# Patient Record
Sex: Female | Born: 1985 | Race: White | Hispanic: No | Marital: Married | Smoking: Never smoker
Health system: Southern US, Community
[De-identification: ages and names within clinical notes are randomized; demographics above are authoritative.]

## PROBLEM LIST (undated history)

## (undated) DIAGNOSIS — J45909 Unspecified asthma, uncomplicated: Secondary | ICD-10-CM

## (undated) DIAGNOSIS — Z862 Personal history of diseases of the blood and blood-forming organs and certain disorders involving the immune mechanism: Secondary | ICD-10-CM

## (undated) DIAGNOSIS — G43909 Migraine, unspecified, not intractable, without status migrainosus: Secondary | ICD-10-CM

## (undated) DIAGNOSIS — I498 Other specified cardiac arrhythmias: Secondary | ICD-10-CM

## (undated) DIAGNOSIS — R7301 Impaired fasting glucose: Secondary | ICD-10-CM

## (undated) DIAGNOSIS — I951 Orthostatic hypotension: Secondary | ICD-10-CM

## (undated) DIAGNOSIS — K219 Gastro-esophageal reflux disease without esophagitis: Secondary | ICD-10-CM

## (undated) DIAGNOSIS — I774 Celiac artery compression syndrome: Secondary | ICD-10-CM

## (undated) DIAGNOSIS — N189 Chronic kidney disease, unspecified: Secondary | ICD-10-CM

## (undated) DIAGNOSIS — I771 Stricture of artery: Secondary | ICD-10-CM

## (undated) DIAGNOSIS — G90A Postural orthostatic tachycardia syndrome (POTS): Secondary | ICD-10-CM

## (undated) DIAGNOSIS — I10 Essential (primary) hypertension: Secondary | ICD-10-CM

## (undated) DIAGNOSIS — R000 Tachycardia, unspecified: Secondary | ICD-10-CM

## (undated) DIAGNOSIS — J309 Allergic rhinitis, unspecified: Secondary | ICD-10-CM

## (undated) DIAGNOSIS — E209 Hypoparathyroidism, unspecified: Secondary | ICD-10-CM

## (undated) DIAGNOSIS — E200 Idiopathic hypoparathyroidism: Secondary | ICD-10-CM

## (undated) DIAGNOSIS — J4520 Mild intermittent asthma, uncomplicated: Secondary | ICD-10-CM

## (undated) DIAGNOSIS — D649 Anemia, unspecified: Secondary | ICD-10-CM

## (undated) HISTORY — DX: Impaired fasting glucose: R73.01

## (undated) HISTORY — DX: Gastro-esophageal reflux disease without esophagitis: K21.9

## (undated) HISTORY — DX: Personal history of diseases of the blood and blood-forming organs and certain disorders involving the immune mechanism: Z86.2

## (undated) HISTORY — DX: Essential (primary) hypertension: I10

## (undated) HISTORY — DX: Stricture of artery: I77.1

## (undated) HISTORY — DX: Unspecified asthma, uncomplicated: J45.909

## (undated) HISTORY — DX: Idiopathic hypoparathyroidism: E20.0

## (undated) HISTORY — DX: Celiac artery compression syndrome: I77.4

## (undated) HISTORY — DX: Migraine, unspecified, not intractable, without status migrainosus: G43.909

## (undated) HISTORY — DX: Mild intermittent asthma, uncomplicated: J45.20

## (undated) HISTORY — DX: Anemia, unspecified: D64.9

## (undated) HISTORY — DX: Chronic kidney disease, unspecified: N18.9

## (undated) HISTORY — DX: Allergic rhinitis, unspecified: J30.9

---
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
## 2005-03-03 ENCOUNTER — Ambulatory Visit: Payer: Self-pay | Admitting: Family Medicine

## 2005-04-06 ENCOUNTER — Ambulatory Visit: Payer: Self-pay | Admitting: Family Medicine

## 2005-07-21 ENCOUNTER — Ambulatory Visit: Payer: Self-pay | Admitting: Internal Medicine

## 2005-08-11 ENCOUNTER — Encounter: Admission: RE | Admit: 2005-08-11 | Discharge: 2005-08-11 | Payer: Self-pay

## 2005-09-01 ENCOUNTER — Ambulatory Visit: Payer: Self-pay | Admitting: Internal Medicine

## 2005-11-09 ENCOUNTER — Ambulatory Visit: Payer: Self-pay | Admitting: Family Medicine

## 2005-12-28 ENCOUNTER — Ambulatory Visit: Payer: Self-pay | Admitting: Family Medicine

## 2006-01-12 ENCOUNTER — Ambulatory Visit: Payer: Self-pay | Admitting: Internal Medicine

## 2006-02-23 ENCOUNTER — Ambulatory Visit: Payer: Self-pay | Admitting: Internal Medicine

## 2006-03-21 ENCOUNTER — Ambulatory Visit: Payer: Self-pay | Admitting: Internal Medicine

## 2006-07-16 ENCOUNTER — Ambulatory Visit: Payer: Self-pay | Admitting: Internal Medicine

## 2007-01-15 ENCOUNTER — Ambulatory Visit: Payer: Self-pay | Admitting: Internal Medicine

## 2007-01-15 ENCOUNTER — Encounter: Payer: Self-pay | Admitting: Internal Medicine

## 2007-01-15 DIAGNOSIS — N3000 Acute cystitis without hematuria: Secondary | ICD-10-CM | POA: Insufficient documentation

## 2007-01-15 DIAGNOSIS — R000 Tachycardia, unspecified: Secondary | ICD-10-CM | POA: Insufficient documentation

## 2007-01-15 DIAGNOSIS — E209 Hypoparathyroidism, unspecified: Secondary | ICD-10-CM | POA: Insufficient documentation

## 2007-01-15 DIAGNOSIS — Z889 Allergy status to unspecified drugs, medicaments and biological substances status: Secondary | ICD-10-CM | POA: Insufficient documentation

## 2007-01-15 DIAGNOSIS — G43909 Migraine, unspecified, not intractable, without status migrainosus: Secondary | ICD-10-CM | POA: Insufficient documentation

## 2007-01-15 LAB — CONVERTED CEMR LAB
Bilirubin Urine: NEGATIVE
Blood in Urine, dipstick: NEGATIVE
Glucose, Urine, Semiquant: NEGATIVE
Ketones, urine, test strip: NEGATIVE
Nitrite: NEGATIVE
Protein, U semiquant: NEGATIVE
Specific Gravity, Urine: 1.01
Urobilinogen, UA: 0.2
WBC Urine, dipstick: NEGATIVE
pH: 6.5

## 2007-02-28 ENCOUNTER — Ambulatory Visit: Payer: Self-pay | Admitting: Internal Medicine

## 2007-05-20 ENCOUNTER — Encounter: Payer: Self-pay | Admitting: Family Medicine

## 2007-05-30 ENCOUNTER — Encounter: Payer: Self-pay | Admitting: Family Medicine

## 2008-04-07 ENCOUNTER — Ambulatory Visit: Payer: Self-pay | Admitting: Family Medicine

## 2008-05-21 ENCOUNTER — Ambulatory Visit: Payer: Self-pay | Admitting: Internal Medicine

## 2008-05-25 ENCOUNTER — Encounter: Payer: Self-pay | Admitting: Family Medicine

## 2008-07-13 ENCOUNTER — Ambulatory Visit: Payer: Self-pay | Admitting: Internal Medicine

## 2008-07-14 ENCOUNTER — Telehealth (INDEPENDENT_AMBULATORY_CARE_PROVIDER_SITE_OTHER): Payer: Self-pay | Admitting: *Deleted

## 2008-07-14 ENCOUNTER — Ambulatory Visit: Payer: Self-pay | Admitting: Internal Medicine

## 2008-07-14 ENCOUNTER — Inpatient Hospital Stay (HOSPITAL_COMMUNITY): Admission: AD | Admit: 2008-07-14 | Discharge: 2008-07-16 | Payer: Self-pay | Admitting: Internal Medicine

## 2008-07-14 DIAGNOSIS — Z8679 Personal history of other diseases of the circulatory system: Secondary | ICD-10-CM

## 2008-07-14 DIAGNOSIS — J1189 Influenza due to unidentified influenza virus with other manifestations: Secondary | ICD-10-CM | POA: Insufficient documentation

## 2008-07-14 LAB — CONVERTED CEMR LAB
Inflenza A Ag: NEGATIVE
Influenza B Ag: NEGATIVE

## 2008-07-15 DIAGNOSIS — R519 Headache, unspecified: Secondary | ICD-10-CM | POA: Insufficient documentation

## 2008-07-15 DIAGNOSIS — R51 Headache: Secondary | ICD-10-CM | POA: Insufficient documentation

## 2008-07-16 ENCOUNTER — Encounter: Payer: Self-pay | Admitting: Family Medicine

## 2008-07-17 ENCOUNTER — Telehealth (INDEPENDENT_AMBULATORY_CARE_PROVIDER_SITE_OTHER): Payer: Self-pay | Admitting: *Deleted

## 2008-07-21 ENCOUNTER — Ambulatory Visit: Payer: Self-pay | Admitting: Family Medicine

## 2008-07-22 ENCOUNTER — Ambulatory Visit: Payer: Self-pay | Admitting: Family Medicine

## 2008-07-27 ENCOUNTER — Encounter (INDEPENDENT_AMBULATORY_CARE_PROVIDER_SITE_OTHER): Payer: Self-pay | Admitting: *Deleted

## 2008-07-27 LAB — CONVERTED CEMR LAB: Calcium Ionized: 1.01 mmol/L — ABNORMAL LOW (ref 1.12–1.32)

## 2008-08-14 ENCOUNTER — Ambulatory Visit: Payer: Self-pay | Admitting: Internal Medicine

## 2008-08-25 ENCOUNTER — Encounter: Payer: Self-pay | Admitting: Family Medicine

## 2008-09-03 ENCOUNTER — Encounter: Payer: Self-pay | Admitting: Family Medicine

## 2008-10-07 ENCOUNTER — Ambulatory Visit: Payer: Self-pay | Admitting: Family Medicine

## 2009-01-12 ENCOUNTER — Encounter: Payer: Self-pay | Admitting: Family Medicine

## 2009-03-19 ENCOUNTER — Ambulatory Visit: Payer: Self-pay | Admitting: Internal Medicine

## 2009-05-20 ENCOUNTER — Encounter: Payer: Self-pay | Admitting: Internal Medicine

## 2009-06-23 ENCOUNTER — Encounter: Payer: Self-pay | Admitting: Internal Medicine

## 2009-08-03 ENCOUNTER — Encounter: Payer: Self-pay | Admitting: Family Medicine

## 2009-12-06 ENCOUNTER — Ambulatory Visit: Payer: Self-pay | Admitting: Internal Medicine

## 2009-12-06 DIAGNOSIS — F32A Depression, unspecified: Secondary | ICD-10-CM | POA: Insufficient documentation

## 2009-12-06 DIAGNOSIS — F329 Major depressive disorder, single episode, unspecified: Secondary | ICD-10-CM

## 2010-02-14 ENCOUNTER — Ambulatory Visit: Payer: Self-pay | Admitting: Internal Medicine

## 2010-02-14 DIAGNOSIS — I10 Essential (primary) hypertension: Secondary | ICD-10-CM | POA: Insufficient documentation

## 2010-02-14 DIAGNOSIS — Q078 Other specified congenital malformations of nervous system: Secondary | ICD-10-CM | POA: Insufficient documentation

## 2010-02-21 ENCOUNTER — Encounter: Payer: Self-pay | Admitting: Internal Medicine

## 2010-11-15 NOTE — Consult Note (Signed)
Summary: [facility]  [facility]   Imported By: Sherian Rein 07/08/2010 14:17:34  _____________________________________________________________________  External Attachment:    Type:   Image     Comment:   External Document

## 2010-11-15 NOTE — Assessment & Plan Note (Signed)
Summary: ROV/ GD  Medications Added ZOFRAN 4 MG TABS (ONDANSETRON HCL) take one tablet every 4 hrs as needed LEXAPRO 10 MG TABS (ESCITALOPRAM OXALATE) take one tablet once daily MAG-OX 400 400 MG TABS (MAGNESIUM OXIDE) 1 tablet every day      Allergies Added:   Visit Type:  Follow-up Primary Provider:  Loreen Freud DO  CC:  pt states she has had elevated blood pressures for the last 4 or 5 months.  She states she has had as high as 165/110.  History of Present Illness: Patient is a 25 year old with a history of autonomic diysfunction.  I last saw her in clinic in June of last year. Since seen, she has dropped out of [facility].  She developed depression.  She was seen by Psych at [facility].  Since leaving school she is followed by intenal medicine.  She is also seeing a Haematologist at [employer] counselling. She says that her blood pressure has been very elevated.  She is gone up on her Effexor since she had problems. with headaches.  She has also been started on Lexapro. She notes some dizziness but no synco[pe.   Current Medications (verified): 1)  Clonidine Hcl 0.1 Mg Tabs (Clonidine Hcl) .... Take One Tablet By Mouth Twice A Day 2)  Effexor Xr 37.5 Mg Xr24h-Cap (Venlafaxine Hcl) .... 3 Tabs Daily 3)  Calcitriol 0.25 Mcg Caps (Calcitriol) .... Take Two Capsules in The Morning and One in The Evening. 4)  Caltrate 600 1500 Mg Tabs (Calcium Carbonate) .... Once Daily 5)  Mononesta Birth Control .... As Directed 6)  Zofran 4 Mg Tabs (Ondansetron Hcl) .... Take One Tablet Every 4 Hrs As Needed 7)  Lexapro 10 Mg Tabs (Escitalopram Oxalate) .... Take One Tablet Once Daily  Allergies (verified): 1)  ! * Yaz 2)  ! Cipro 3)  ! Septra 4)  ! Macrobid 5)  ! Vancomycin 6)  ! Beta Blockers 7)  ! * Mestinon 8)  ! * Proamitinbe 9)  ! Zoloft 10)  ! * Aderral 11)  ! * Concerta  Past History:  Past Medical History: Last updated: 07/14/2008 Dysautonomia,( POTS) diagnosed age 89 @  [facility] via [facility]; Recurrent UTI, Dr Aldean Ast; Hypoparathyroidism ; Hospitization 9/05 Acute Mono with elevated LFTs, decreased platelets, systemic fatigue ; Hosp 2/06 Influenza A with nausea  & vomiting;  Headache, migraine PMH of  Family History: Last updated: 07/14/2008 Father: HTN, bicuspid AV Mother: Gastroparesis, IBS Siblings: bro asthma, migraines  Social History:  Never Smoked Alcohol use-no After above history obtained in presence of her mother; I received additional history from Dr Tenny Craw . Apparently she does drink & smoke socially @ [employer]  Vital Signs:  Patient profile:   25 year old female Height:      67 inches Weight:      139 pounds BMI:     21.85 Pulse rate:   90 / minute Pulse (ortho):   100 / minute Pulse rhythm:   regular BP standing:   150 / 103  Vitals Entered By: Vikki Ports (December 06, 2009 4:41 PM)  Serial Vital Signs/Assessments:  Time      Position  BP       Pulse  Resp  Temp     By 4:46 PM   Lying RA  147/97   87                    Luz Jaramillo 4:46 PM   Sitting  145/103  95                    Vikki Ports 4:46 PM   Standing  150/103  100                   Vikki Ports 4:48 PM   Standing  145/105  98                    Luz Jaramillo 4:51 PM   Standing  148/106  99                    Vikki Ports   Physical Exam  Additional Exam:  Patient is in NAD HEENT:  Normocephalic, atraumatic. EOMI, PERRLA.  Neck: JVP is normal. No thyromegaly. No bruits.  Lungs: clear to auscultation. No rales no wheezes.  Heart: Regular rate and rhythm. Normal S1, S2. No S3.   No significant murmurs. PMI not displaced.  Abdomen:  Supple, nontender. Normal bowel sounds. No masses. No hepatomegaly.  Extremities:   Good distal pulses throughout. No lower extremity edema.  Musculoskeletal :moving all extremities.  Neuro:   alert and oriented x3.    EKG  Procedure date:  12/06/2009  Findings:      NSR>  90 bpm  Impression &  Recommendations:  Problem # 1:  POSTURAL HYPOTENSION, HX OF (ICD-V12.50) Patient is now hypertensive.  I have asked her to cut back on her Effexor to 2 tabs per day.  I will discuss with pharmacy further cut backs. She did not do well by her report on b blockers.  Felt foggy.  Problem # 2:  HEADACHE (ICD-784.0) Rx HTN Rx MgOxide  Problem # 3:  DEPRESSIVE DISORDER NOT ELSEWHERE CLASSIFIED (ICD-311) Will contact behavioral health for recommendations. Prescriptions: MAG-OX 400 400 MG TABS (MAGNESIUM OXIDE) 1 tablet every day  #30 x 6   Entered by:   Layne Benton, RN, BSN   Authorized by:   Sherrill Raring, MD, [facility]   Signed by:   Layne Benton, RN, BSN on 12/06/2009   Method used:   Electronically to        [facility] [address] [facility] [phone]* (retail)       [address]       [place], [place]  [address]       Ph: [phone]       Fax: [phone]   RxID:   [id number]   Appended Document: ROV/ GD Left msg to call.  Needs to decrease Effexor to 1x per day.  Needs f/u appt  for bp, orthostatics Needs to sign so that counsellor can speak to me Ollen Gross) Needs a psychiatrist.  3 options:  [facility]:  [phone]; [facility](?spelling)  [phone]; [facility]:  [phone]  Appended Document: ROV/ GD Patient spoke with Dr.Laquandra Carrillo...she is aware about medication...she has follow up appointment on 3/31.

## 2010-11-15 NOTE — Assessment & Plan Note (Signed)
Summary: 6wk f/u sl    Primary Provider:  Dwana Melena   History of Present Illness: patient is a 25 year old with a history of dysautonomia.    Allergies: 1)  ! * Yaz 2)  ! Cipro 3)  ! Septra 4)  ! Macrobid 5)  ! Vancomycin 6)  ! Beta Blockers 7)  ! * Mestinon 8)  ! * Proamitinbe 9)  ! Zoloft 10)  ! * Aderral 11)  ! * Concerta

## 2010-11-15 NOTE — Assessment & Plan Note (Signed)
Summary: 6wk f/u sl  Medications Added EFFEXOR XR 37.5 MG XR24H-CAP (VENLAFAXINE HCL) 2 tabs daily MAG-OX 400 400 MG TABS (MAGNESIUM OXIDE) 1 tablet every day HOLD XANAX 0.5 MG TABS (ALPRAZOLAM) as needed BENICAR 20 MG TABS (OLMESARTAN MEDOXOMIL) once daily        Visit Type:  6 wk Primary Provider:  Dr. Marnee Spring. Hall  CC:  dizziness.  History of Present Illness: Patient is a 25 year old with a history of autonomic dysfunction, depression.  I last saw her in February.  At the time she was hypertensive.  I had asked her to cut back on the Effexor since she was on Lexapro.  SHe has since been seen by [facility].  He placed her on Benicar. Since I saw her she has done fairly well.  She is waitressing about 5 hrs per day.  Has some dizziness but not severe.  Other days doing fine.  No chest pains. Does complain of headaches, no real change. She is followed by the counsellor and enjoys that [facility])  She said that Dr. Margo Aye wants to back down on Effexor and wanted cardiology  input.  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 Take One Tablet By Mouth Twice A Day 2)  Effexor Xr 37.5 Mg Xr24h-Cap (Venlafaxine Hcl) .... 2 Tabs Daily 3)  Calcitriol 0.25 Mcg Caps (Calcitriol) .... Take Two Capsules in The Morning and One in The Evening. 4)  Caltrate 600 1500 Mg Tabs (Calcium Carbonate) .... Once Daily 5)  Mononesta Birth Control .... As Directed 6)  Zofran 4 Mg Tabs (Ondansetron Hcl) .... Take One Tablet Every 4 Hrs As Needed 7)  Lexapro 10 Mg Tabs (Escitalopram  Oxalate) .... Take One Tablet Once Daily 8)  Mag-Ox 400 400 Mg Tabs (Magnesium Oxide) .Marland Kitchen.. 1 Tablet Every Day Hold 9)  Xanax 0.5 Mg Tabs (Alprazolam) .... As Needed 10)  Benicar 20 Mg Tabs (Olmesartan Medoxomil) .... Once Daily  Allergies: 1)  ! * Yaz 2)  ! Cipro 3)  ! Septra 4)  ! Macrobid 5)  ! Vancomycin 6)  ! Beta Blockers 7)  ! * Mestinon 8)  ! * Proamitinbe 9)  ! Zoloft 10)  ! * Aderral 11)  ! * Concerta  Past History:  Past medical, surgical, family and social histories (including risk factors) reviewed, and no changes noted (except as noted below).  Past Medical History: Dysautonomia,( POTS) diagnosed age 89  Recurrent UTI, Dr Aldean Ast;  Hypoparathyroidism ;  Hospitization 9/05 Acute Mono with elevated LFTs, decreased platelets ;  Hosp 2/06 Influenza A   Headache, migraine   Past Surgical History: Reviewed history from 07/14/2008 and no changes required. Denies surgical history  Family History: Reviewed history from 07/14/2008 and no changes required. Father: HTN, bicuspid AV Mother: Gastroparesis, IBS Siblings: bro asthma, migraines  Social History: Reviewed history from 12/06/2009 and no changes required.  Never Smoked Alcohol use-no After above history obtained in presence of her mother; I received additional history from Dr Tenny Craw . Apparently she does drink & smoke socially @ [facility]  Vital  Signs:  Patient profile:   25 year old female Height:      67 inches Weight:      139 pounds Pulse rate:   77 / minute BP sitting:   102 / 67  Vitals Entered By: Oswald Hillock (Feb 14, 2010 9:59 AM)  Serial Vital Signs/Assessments:  Time      Position  BP       Pulse  Resp  Temp     By 10:02 AM            102/67   77                    Oswald Hillock                     103/70   [address]                    Oswald Hillock                     106/70   80                    Oswald Hillock 10:05 AM            107/76   91                    Oswald Hillock                     010/93   23                    Oswald Hillock  Comments: 10:02 AM no symptoms By: Oswald Hillock  no symptoms By: Oswald Hillock  no symptoms By: Oswald Hillock  10:05 AM no symptoms By: Oswald Hillock  something going with Pt. legs By: Oswald Hillock    Physical Exam  Additional Exam:  Patient is in NAD HEENT:  Normocephalic, atraumatic. EOMI, PERRLA.  Neck: JVP is normal. No thyromegaly. No bruits.  Lungs: clear to auscultation. No rales no wheezes.  Heart: Regular rate and rhythm. Normal S1, S2. No S3.   No significant murmurs. PMI not displaced.  Abdomen:  Supple, nontender. Normal bowel sounds. No masses. No hepatomegaly.  Extremities:   Good distal pulses throughout. No lower extremity edema.  Musculoskeletal :moving all extremities.  Neuro:   alert and oriented x3.    Impression & Recommendations:  Problem # 1:  DYSAUTONOMIA (ICD-742.8) Patient seems to be doing fairly well.  Her BP is better, may be a little too good. James says that Dr. Margo Aye would like to back down on the Effexor.  I would support this, going down to 1 tab per day If she does this I would recommend decreasing the Benicar to 1/2 tab per day, letting her BP run up some. SHe did not do well coming off clonidine in past. I think her headaches are probably associated iwth the autonomic dysfunction.  She is on MgOxide. I have encouraged her to to keep up fluid and salt intake. I have encouraged her to keep active, wear support socks. I will plan f/u in about 4 months, sooner for problems.  Problem # 2:  DEPRESSIVE DISORDER NOT ELSEWHERE CLASSIFIED (ICD-311) Plan per Dr. Margo Aye.  Administrator, sports as well  Problem # 3:  HYPERTENSION, BENIGN (ICD-401.1) Improved.  See 1.

## 2010-11-15 NOTE — Letter (Signed)
Summary: [facility]  [facility]   Imported By: Harlon Flor 02/23/2010 11:37:54  _____________________________________________________________________  External Attachment:    Type:   Image     Comment:   External Document

## 2010-11-29 ENCOUNTER — Emergency Department (HOSPITAL_COMMUNITY)
Admission: EM | Admit: 2010-11-29 | Discharge: 2010-11-29 | Disposition: A | Discharge status: Home / Self Care | Payer: No Typology Code available for payment source | Attending: Emergency Medicine | Admitting: Emergency Medicine

## 2010-11-29 DIAGNOSIS — Y92410 Unspecified street and highway as the place of occurrence of the external cause: Secondary | ICD-10-CM | POA: Insufficient documentation

## 2010-11-29 DIAGNOSIS — S8000XA Contusion of unspecified knee, initial encounter: Secondary | ICD-10-CM | POA: Insufficient documentation

## 2010-11-29 DIAGNOSIS — R51 Headache: Secondary | ICD-10-CM | POA: Insufficient documentation

## 2010-11-29 DIAGNOSIS — E039 Hypothyroidism, unspecified: Secondary | ICD-10-CM | POA: Insufficient documentation

## 2010-12-01 ENCOUNTER — Emergency Department (HOSPITAL_COMMUNITY)
Admission: EM | Admit: 2010-12-01 | Discharge: 2010-12-01 | Disposition: A | Discharge status: Home / Self Care | Payer: No Typology Code available for payment source | Attending: Emergency Medicine | Admitting: Emergency Medicine

## 2010-12-01 ENCOUNTER — Emergency Department (HOSPITAL_COMMUNITY): Payer: No Typology Code available for payment source

## 2010-12-01 DIAGNOSIS — M542 Cervicalgia: Secondary | ICD-10-CM | POA: Insufficient documentation

## 2010-12-01 DIAGNOSIS — R51 Headache: Secondary | ICD-10-CM | POA: Insufficient documentation

## 2010-12-01 DIAGNOSIS — Z139 Encounter for screening, unspecified: Secondary | ICD-10-CM | POA: Insufficient documentation

## 2010-12-01 DIAGNOSIS — I498 Other specified cardiac arrhythmias: Secondary | ICD-10-CM | POA: Insufficient documentation

## 2011-02-03 ENCOUNTER — Telehealth: Payer: Self-pay | Admitting: *Deleted
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
## 2011-02-28 NOTE — Assessment & Plan Note (Signed)
[facility]                            CARDIOLOGY OFFICE NOTE   Alisha, Copeland                     MRN:          [id number]  DATE:02/28/2007                            DOB:          1986/05/22    IDENTIFICATION:  [name] is a 25 year old woman with a history of  dysautonomia.  I saw her back in October.   I have been on the phone with the patient's mother several times in the  past few months.  Her blood pressure is running on the high side.  I  discussed this with Princella Pellegrini as well.  Initially recommended to slowly  taper back on her Effexor.  She is on clonidine patch as well.  With  this not working and her feeling bad with any further taper, I  recommended adding pindolol, low dose 2.5 b.i.d. with followup.   The patient has been reluctant and does not want to add the pindolol.  Note they received an email from Dr. Berneice Gandy that recommended Coreg 3.125  b.i.d., again she has not filled this as well.   She is active.  She finished her term at school and is going to be  entering summer school.  She takes activities as tolerated.  Blood  pressure running up at home, no dizziness.  Not drinking fluids as she  should, which she admits to.   CURRENT MEDICATIONS:  Include:  1. Clonidine patch 0.3.  2. Calcium b.i.d.  3. __________ with D.  4. Effexor 225 daily.   PHYSICAL EXAMINATION:  The patient is in no distress.  Orthostatics:  Lying blood pressure 124/78, pulse is 84.  Sitting  120/88, pulse 82.  Standing immediately 122/78, pulse 94.  Two minutes  124/88, pulse 95 and at five minutes 131/76, pulse 110.  Patient  asymptomatic.  LUNGS:  Are clear.  CARDIAC:  Exam regular rate and rhythm.  S1, S2. No S3.  No murmurs.  ABDOMEN:  Is benign.  EXTREMITIES:  No edema.   IMPRESSION:  1. Autonomic dysfunction.  Blood pressure today is the lowest it has      been.  Her mother says that the diastolics have been in the 90s to      100 and  the systolics at least in the 140s.  I will have her check      her pressure at home.  If it is running up again, I would agree      with the Coreg, she can start once a day and then slowly increase.  2. On examination today she still shows some evidence of pooling but      again, does not meet any significant difference.  I encouraged her      though to continue to drink fluids.  Take activities as tolerated.  3. I will set to see the patient back at the end of August, before      school starts.  She will call if she has any problems before then.     Pricilla Riffle, MD, [facility]     PVR/MedQ  DD: 02/28/2007  DT: 02/28/2007  Job #: [id number]   cc:   Georgana Curio, MD

## 2011-02-28 NOTE — Assessment & Plan Note (Signed)
[facility]                            CARDIOLOGY OFFICE NOTE   Alisha [facility]eland, Alisha Copeland                     MRN:          [id number]  DATE:05/21/2008                            DOB:          31-Mar-1986    IDENTIFICATION:  Alisha Copeland is a 25 year old woman with a history of  dysautonomia.  I last saw her in the clinic back in May 2008.  We have  been on the phone several times since and via e-mail with her mother.  The patient has a history of dysautonomia, now is having problems with  hypertension.  I had recommended an Effexor taper and prescribed the  Coreg.  She says with the Coreg she feels just fatigued.  She is not  taking it now.   She is very active.  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  She denies dizziness.  She has had problems with  UTIs, being followed by her gynecologist.  She is interested in  contraceptive that will not harm or affect her dysautonomia.   Her current medicines include:  1. Clonidine patch 0.3.  2. Calcium b.i.d.  3. Effexor 150.   PHYSICAL EXAMINATION:  GENERAL:  The patient is in no distress at rest.  VITAL SIGNS:  Blood pressure lying 154/104 and pulse 98, sitting 152/106  and pulse 92; standing at 0 minute 149/108 and pulse 96, at 2 minutes  150/111 and pulse 105, and then at 5 minutes 158/116 and pulse 106.  LUNGS:  Clear.  CARDIAC:  Regular rate and rhythm.  S1 and S2.  No S3.  ABDOMEN:  Benign, thin; note a navel piercing is present.  EXTREMITIES:  Good distal pulses.  No edema.   A 12-lead EKG, normal sinus rhythm, 96 beats per minute.   IMPRESSION:  Dysautonomia.  Alisha [facility] has continued to have  hypertension, which is the most pronounced I have seen today.  She needs  to come down on the Effexor.  She is uneasy about doing this because she  had problems with migraines.  I tried to reassure her.  I think this  occurred at the time when her blood pressure was on the low side.   I  would recommend tapering it down to 75 mg at first.  I think she should  check her blood pressures at home.  Indeed, we may need to taper it  further.  Her experience with Coreg as it makes her feel very fatigued  and she is worried because she is going to be entering school.  I told  her I would be in touch with her in the next couple of weeks, but I have  given her a prescription for Effexor XR 75 mg to begin tomorrow.  She  will be in touch with me the week after next.   In regards to oral contraceptives, she is sexually active.  I told her  to again to be careful for STDs.  I will be in touch with Dr. Nelta Numbers  office to see if there is hormone combination that may  be preferred.  Note, after leaving the clinic, I found that the patient indeed has  begun smoking.  I will need to discuss this with her.  Until her blood  pressure is resolved and until this is changed, I will not prescribe any  oral contraceptive.   I will be in touch with the patient regarding followup possibly around  her next break, but again follow up closely with her blood pressures  over e-mail/via the phone.     Pricilla Riffle, MD, [facility]  Electronically Signed    PVR/MedQ  DD: 05/22/2008  DT: 05/22/2008  Job #: [id number]

## 2011-02-28 NOTE — Discharge Summary (Signed)
Alisha Copeland, Alisha Copeland              ACCOUNT NO.:  [id number]   MEDICAL RECORD NO.:  [id number]          PATIENT TYPE:  INP   LOCATION:  1411                         FACILITY:  [facility]   PHYSICIAN:  Raenette Rover. Felicity Coyer, MDDATE OF BIRTH:  Mar 01, 1986   DATE OF ADMISSION:  07/14/2008  DATE OF DISCHARGE:  07/16/2008                               DISCHARGE SUMMARY   PRIMARY CARE PHYSICIAN:  Lelon Perla, DO   CARDIOLOGIST:  Pricilla Riffle, M.D.   DISCHARGE DIAGNOSES:  1. Fever, cough and arthralgias with questionable bronchitis on      admission.  2. Pott's syndrome.  3. Hypocalcemia.   HISTORY OF PRESENT ILLNESS:  Alisha [facility] is a 25 year old white female  with past medical history of Pott's syndrome and recurrent UTI's.  The  patient presented to primary physician's office on the day of admission  with reports of fever, chills, myalgias and dry cough, onset  approximately July 04, 2008.  The patient also reported upon  initial exam that her laboratory partner at [facility] had been  diagnosed with H1N1.  Since onset of symptoms, the patient had been with  poor p.o. intake and felt to need IV fluids at the time of evaluation in  the office.  The patient was admitted at that time for further  evaluation and treatment.   PAST MEDICAL HISTORY:  1. 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  2. Recurrent UTI's.  3. Hypoparathyroidism.  4. Hospitalization in February, 2006 with Influenza A.   [facility] COURSE:  1. Fever, cough and arthralgias.  Upon initial exam in the office, the      patient's exam revealed mild rhonchi, left greater than right, with      loose cough, therefore the patient was placed on empiric Avelox for      suspected bronchitis.  Chest x-ray done at the time of admission      was negative for any acute findings.  The patient was mildly      tachycardic with standing at time of admission, however, this has      resolved with IV fluid hydration.  The  patient underwent Influenza      A, B testing prior to admission which was negative.  Due to system-      wide policy, the patient was not tested for H1N1 during this      hospitalization, as she did not require ICU stay.  In addition,      empiric Tamiflu was held as the patient's symptoms had been ongoing      x2 weeks, therefore, Tamiflu would likely be ineffective at this      point.  Again, the patient has responded well to empiric Avelox      treatment as well as IV fluid hydration.  She is tolerating p.o.'s      without any difficulty and therefore felt medically stable for      discharge home.  Orthostatic vital signs checked on the morning of      discharge are within normal limits.   1. Hypocalcemia.  Patient with history of same  with      hypoparathyroidism.  Patient to continue supplements with      outpatient followup as scheduled.   DISCHARGE MEDICATIONS:  1. Avelox 400 mg p.o. daily until gone.  2. Clonidine TTS 0.3 mg patch, change weekly.  3. Calcium 600 mg p.o. t.i.d.  4. Effexor XR 75 mg tabs, 37.5 mg tab p.o. daily.  5. Calcitriol 0.25 mcg tabs, two pills q.a.m. and one pill q.p.m.   LABORATORY DATA:  Pertinent laboratory work at the time of discharge  included a white cell count of 4.0, platelet count 206,000, hemoglobin  14.6, hematocrit 42.0.  Sodium 138, potassium 3.6, BUN 11, creatinine  0.61.  Liver function tests within normal limits.  Total albumin 3.6.  phosphorus level within normal limits.  Ionized calcium 0.97.  Monospot  was negative.  Urine culture was no growth.   DISPOSITION:  Patient felt medically stable for discharge home at this  time.  She is instructed to follow up with her primary care physician,  [facility] on Tuesday, July 21, 2008 at 3:45 P.M.      Cordelia Pen, NP      Raenette Rover. Felicity Coyer, MD  Electronically Signed    LE/MEDQ  D:  07/16/2008  T:  07/16/2008  Job:  [id number]   cc:   Lelon Perla, DO  [address]  [place], [place] [address]   Pricilla Riffle, MD, [facility]  [address]  [address]  [place]  [place] [address]

## 2011-02-28 NOTE — Letter (Signed)
July 21, 2008     RE:  AILANY, [name]  MRN:  [id number]  /  DOB:  [birth year]   To Whom It May Concern:   This is a letter regarding [facility] (DOB [birth year]).  She is a  patient I have followed for several years.  She has suffered from  dysautonomia (POTS postural orthosatic tachycardia syndrome)  since she  was an early teen.  [name] has had a profound case, being treated with  many medicines, hospitalized at times.  She has been followed by several  cardiologist including Edsel Petrin, MD at  [facility] and  currently by Georgana Curio, MD, at [facility].   With her dysautonomia [name] is very sensitive to concurrent  illnesses.  Unfortunately she has just had a viral infection (possible  swine flu) and this has again exacerbated her autonomic dysfunction  She  was  hospitalized last week and has undergone IV hydration.   I give strong support for [name] to be placed on medical leave so she  can recover from this illness.  I do not feel she can continue on with a  full workload at school which is unfortunate as she was enjoying it.  I  hope when she gets over this she can start again.  If you have any  questions about her medical condition, I would be happy to speak to  anyone.  My office number is [phone].    Sincerely,      Pricilla Riffle, MD, [facility]  Electronically Signed    PVR/MedQ  DD: 07/20/2008  DT: 07/20/2008  Job #: [id number]

## 2011-02-28 NOTE — Letter (Signed)
May 21, 2008     RE:  Alisha Copeland, Alisha Copeland  MRN:  [id number]  /  DOB:  [birth year]   To whom it may concern:   [name] is a patient I have followed in Cardiology Clinic now for  several years.  She is a 25 year old woman with a history significant  for autonomic dysfunction.  [name] has had dysautonomia (postural  orthostatic tachycardia syndrome).  Since the mid 2000s,  she has had a  significant case being evaluated at [facility] (hospitalized early on), [facility]  (seen by Renie Ora in Neurology) and also evaluated by Dr. Georgana Curio [facility] [place] [place]).  She has gone from significant  hypotension to now experiencing hypertension.  I have worked with Dr.  Berneice Gandy in managing her medical therapy through this.   [name] is very active.  I have encouraged her to stay active.  I have  also encouraged her to continue on her medicines.  Continue to drink  adequate fluids.  I have not given her any limitations on activity, but  to take activity as tolerated.   If you need any further medical records or would like to discuss her  case further, I would be happy to.  You can contact me at [phone].    Sincerely,      Pricilla Riffle, MD, [facility]  Electronically Signed    PVR/MedQ  DD: 05/22/2008  DT: 05/22/2008  Job #: [id number]

## 2011-02-28 NOTE — Assessment & Plan Note (Signed)
[facility]                            CARDIOLOGY OFFICE NOTE   KELLIS, TOPETE                     MRN:          [id number]  DATE:08/14/2008                            DOB:          [birth year]    IDENTIFICATION:  Alisha Copeland is a 25 year old with a history of  dysautonomia (POTS).  I last saw her in clinic back in August.   In the interval, she actually has had significant problems.  She was  admitted at [facility] after having the flu.  She seemed to be  getting over her symptoms and then got worse.  She was hydrated and sent  home.  She has since dropped out of school because of missing too much  and I wrote a letter of medical disability.  Plans to return in the  winter.   Since I talked to her last, she is actually feeling much better.  She  denies any significant dizziness.  Breathing is okay.  She was living at  [employer] at hometown.   Of note, she did quit smoking.   CURRENT MEDICINES:  1. Clonidine patch 0.3.  2. Calcium 600 b.i.d.  3. Effexor 75 a.m., 37.5 p.m.  4. Calcitriol 0.25 mcg 2 in a.m. and 1 in p.m.   PHYSICAL EXAMINATION:  GENERAL:  The patient is in no distress at rest.  VITAL SIGNS:  Blood pressure lying 123/87, pulse 90; sitting 111/76,  pulse 93; standing at 0 minutes 126/88, pulse 102; at 2 minutes, 117/86,  pulse 104; at 5 minutes, 126/85, pulse 101.  The patient asymptomatic  throughout the visit.  HEENT:  Tonsils slightly prominent, not erythematous, not significantly  enlarged.  No erythema.  NECK:  One small lymph node firm, nontender, submandibular on the left.  No other nodes palpable.  LUNGS:  Clear without rales or wheezes.  CHEST:  No palpable nodes.  CARDIAC:  Regular rate and rhythm, S1 and S2.  No S3, no significant  murmurs.  ABDOMEN:  Benign.  No masses.  EXTREMITIES:  No other nodes palpated.  No edema.  Pulses 2+.   IMPRESSION:  1. Dysautonomia, improved.  Actually her blood pressure  is within      normal range and I wonder if it will start to go up.  I would not      change her medicines for now.  Continue to encourage adequate      hydration.  Staying active.  When the patient returns to school, I      think, getting a parking permit, so she does not have to wait for a      bus or stand down the bus would be appropriate.  2. Lymph node.  I will put a message to Dr. Felipa Evener at [facility].  He sees      her periodically.  I would have her follow up for now.  Note a CBC      with differential was normal month ago when she had the node.  3. Health care maintenance.  Congratulated her on smoking cessation.      Encouraged  her to stay active.   I will set to see the patient back in the spring again available as  needed.   ADDENDUM:  The patient should continue to follow up blood pressures at  home.     Pricilla Riffle, MD, [facility]  Electronically Signed    PVR/MedQ  DD: 08/14/2008  DT: 08/15/2008  Job #: [id number]

## 2011-03-03 NOTE — Discharge Summary (Signed)
Alisha Copeland, Alisha Copeland              ACCOUNT NO.:  [id number]   MEDICAL RECORD NO.:  [id number]          PATIENT TYPE:  INP   LOCATION:  0377                         FACILITY:  [facility]   PHYSICIAN:  Rene Paci, M.D. LHCDATE OF BIRTH:  [birth year]   DATE OF ADMISSION:  12/11/2004  DATE OF DISCHARGE:  12/13/2004                                 DISCHARGE SUMMARY   DISCHARGE DIAGNOSES:  1.  Acute influenza, type A, with nausea, vomiting, and malaise  with      exacerbation of underlying postural orthostatic tachycardia syndrome,      improved.  2.  History of primary hypoparathyroidism.  3.  History of migraines.  4.  History of acute mono with thrombocytopenia and hepatitis in September      2005.   DISCHARGE MEDICATIONS:  Tamiflu 75 mg p.o. b.i.d. times five additional  days, plus Robitussin and Phenergan p.r.n. symptomatic manifestations.  Otherwise medications as before, including Effexor, clonidine patch,  aspirin, calcitriol, and calcium, plus Naproxen p.r.n.   The patient is discharged to home in medically stable condition, but advised  to continue bedrest through the end of this week, another five days and then  slowly resume activity as tolerated. Encourage fluid intake and call primary  MD if this is unable to achieved at home or return to the emergency room as  needed.   CONDITION ON DISCHARGE:  Medically stable.   HOSPITAL COURSE:   PROBLEM #1:  Acute influenza. The patient is an 25 year old woman with  history of POTS (postural orthostatic tachycardia syndrome) who becomes  extremely orthostatic with acute infection. This was experienced several  times in her childhood and recently in September 2005 with acute mono. At  this time she was brought to the emergency room again with fever, dizziness,  and palpitations, and found to have a fever with positive nasal swab for  influenza A. She was admitted for IV hydration and symptomatic care. Over  the next 48 hours the  patient had slight improvement in her nausea,  vomiting, and orthostatic symptoms with IV hydration. She was treated with  Tamiflu as well as symptomatically with Robitussin and Phenergan, and at the  time of discharge was feeling improved, tolerating her diet, fluids, and  felt stable to be discharged home.  She is recommended continued bedrest  given her tendency towards orthostatic symptoms and the persistent nature of  flu-like symptoms. Hospital follow-up will be with primary care physician as  needed.   PROBLEM #2:  Her other medical conditions were treated with medications as  prior to admission and no changes were made.    VL/MEDQ  D:  12/13/2004  T:  12/13/2004  Job:  [id number]

## 2011-03-03 NOTE — Assessment & Plan Note (Signed)
[facility]                            CARDIOLOGY OFFICE NOTE   Alisha Copeland, Alisha Copeland                     MRN:          [id number]  DATE:05/22/2008                            DOB:          [birth year]    IDENTIFICATION:  The patient is a 25 year old, who I follow in the  clinic for dysautonomia.  I saw her yesterday, and I am calling her  regarding phone conversation.  I have talked to the patient today.  In  deed, she is smoking over the past year about a pack a day.  I counseled  her that I would recommend quitting.  The medical complications are not  worth it.  If she wants to be on her oral contraceptives that would be  contraindicated with her history of smoking.  She seemed receptive to  this.   I also discussed her blood pressure with her.  I would like her to take  a half of the 3.125 Coreg 1 time per day to see if we can get her  pressure down.  She will again taper the Effexor to 75.  Call back me in  2 weeks.     Pricilla Riffle, MD, [facility]  Electronically Signed    PVR/MedQ  DD: 05/22/2008  DT: 05/23/2008  Job #: [id number]

## 2011-03-03 NOTE — Assessment & Plan Note (Signed)
[facility]                            CARDIOLOGY OFFICE NOTE   ZURIAH, BORDAS                     MRN:          [id number]  DATE:10/15/2008                            DOB:          March 27, 1986    IDENTIFICATION:  [name] is a 25 year old woman who calls in today.  It is a noncardiac complaint.  She is complaining of vaginal bleeding.  She has had for the past couple days.  Last menstrual period began on  16th and this is now a new.  She talked to her gynecologist in the  office that could be related to trimethoprim, which she is using for the  UTI.  This is more bleeding than she has had in the past though.   I have recommended that she followup with her gynecologist for an  examination.   In regard to the e-mail I received from [place] regarding possible oral  contraceptives, her blood pressure she reports is now low in normal  range and it would be okay to try a low-dose contraceptives again with  close followup of blood pressure.  She does report that she is not  smoking.   She has said that her p.o. intake is down some.  Weight is down some,  but she remains active.   I will review when her next followup.  It looks like it will be in the  spring.  She should call if there are any other problems.     Pricilla Riffle, MD, [facility]  Electronically Signed    PVR/MedQ  DD: 10/15/2008  DT: 10/16/2008  Job #: [id number]

## 2011-03-03 NOTE — Discharge Summary (Signed)
Alisha Copeland, Alisha Copeland NO.:  [id number]   MEDICAL RECORD NO.:  [id number]          PATIENT TYPE:  EMS   LOCATION:  ED                           FACILITY:  [facility]   PHYSICIAN:  Angelena Sole, M.D. [facility] DATE OF BIRTH:  April 14, 1986   DATE OF ADMISSION:  08/22/2004  DATE OF DISCHARGE:  08/23/2004                                 DISCHARGE SUMMARY   Patient was sent over from the office to get her PICC line pulled.  I spoke  with the charge nurse, and she said we could just send her over, and they  would DC it.  Gave the verbal order over the phone.  Patient came over and  was seen.  PICC line was pulled.      AMK/MEDQ  D:  11/10/2004  T:  11/10/2004  Job:  [id number]

## 2011-03-03 NOTE — H&P (Signed)
NAMEBEMNET, TROVATO NO.:  [id number]   MEDICAL RECORD NO.:  [id number]          PATIENT TYPE:  INP   LOCATION:  0377                         FACILITY:  [facility]   PHYSICIAN:  Wanda Plump, MD [facility]    DATE OF BIRTH:  July 17, 1986   DATE OF ADMISSION:  12/11/2004  DATE OF DISCHARGE:                                HISTORY & PHYSICAL   PRIMARY CARE PHYSICIAN:  Angelena Sole, M.D. [facility].   CHIEF COMPLAINT:  Fever and cough.   HISTORY OF PRESENT ILLNESS:  Miss Manson Passey is an 25 year old white female with  multiple medical problems, who was admitted to the hospital due to history  of cough and fever.  She was evaluated in the ER and diagnosed with  influenza.  She received 3 liters of IV fluids and was discharged home.  However, as she was trying to get up and walk, she was noticed to be very  weak and unsteady.  Also got nauseated after her first dose of Tamiflu and  consequently is admitted to my service.   PAST MEDICAL HISTORY:  1.  Postural tachycardia syndrome.  2.  Acute mononucleosis with thrombocytopenia and increased liver function      tests, admitted for this condition in September of 2005.  3.  Primary hypoparathyroidism.  4.  Migraines.  5.  No history of surgeries.   FAMILY HISTORY:  Grandmother and aunt have diabetes, father has  hypertension.   SOCIAL HISTORY:  Denies any tobacco or alcohol use.   REVIEW OF SYSTEMS:  She denies any sinus pressure.  She did have some runny  nose and sore throat.  She has a very mild headache.  Denies any chest pain,  shortness of breath or myalgia.  She had some diarrhea without any blood in  the stools last night.  Denies any abdominal pain, dysuria or hematuria.  Her last menstrual period was November 16, 2004.  She did not have a rash up  to the time she came to the ER, when the mom noticed some rash of the  abdomen and chest.  Denies any sick contacts except for the fact that she  volunteers at the local school.  She  has not been in college in the last few  months.  She has not been in contact with any wild animals.  No history of  tick bites.   MEDICATIONS:  1.  Effexor XR 300 mg, 1 p.o. q.d.  2.  Clonidine patch 0.3 mg every week.  3.  Aspirin, only when she takes Procrit.  She has not been on Procrit for      the last four weeks.  4.  Naproxen p.r.n.  5.  Calcitrol with poor compliance.  6.  Calcium supplements.   ALLERGIES:  1.  ADDERALL.  2.  CIPRO.  3.  ZOLOFT.  4.  VANCOMYCIN.  5.  MESTINON.  6.  PROPULSID.  7.  BETA-BLOCKERS.  8.  PROMETIN.  9.  NICOTINE PATCH.  10. CONCERTA.   PHYSICAL EXAMINATION:  GENERAL:  The patient is alert, looks weak but not  toxic.  VITAL SIGNS:  Initial temperature was around 100 with a heart rate of 130  and a blood pressure of 89/63.  Orthostatic vital signs show that, lying  down, pulse is 92 with blood pressure 121/75 but, when she stands up, her  pulse goes up to 114 with a blood pressure 113/74.  HEENT:  Oropharynx with no redness or discharge.  Her membranes are dry.  NECK:  Full range of motion.  LUNGS:  Clear __________.  CARDIOVASCULAR:  Regular rate and rhythm without murmur.  ABDOMEN:  Benign, nontender, soft.  BACK:  She does not have any CVA tenderness.  EXTREMITIES:  No edema.  SKIN:  The rash is limited to the anterior chest and upper abdomen.  She has  multiple small, confluent, erythematous patches.  No petechiae are noticed.  The rash is around the monitor patches.   LABORATORY AND X-RAYS:  White count is 2.8, hemoglobin 12.1, platelets 153,  potassium 3.8, creatinine 0.8, blood sugar 84.  LFTs are negative.  Urine  dip is negative except for ketones.  The nasal wash are positive for  influenza A.  Chest x-ray show hyperinflation.   ASSESSMENT AND PLAN:  Miss Manson Passey has been admitted with febrile illness.  She is still orthostatic despite IV fluids and she has a positive influenza  test.  The symptoms are most consistent with  influenza.  However, I am  concerned about the rash.  I did notice that the neck is full range of  motion and she has a very mild headache.  I will admit this lady to the  floor, continue with IV fluids, draw some blood cultures and give her  Tamiflu.  We may need to stop Tamiflu if she gets nauseated again after she  takes it.  I discussed with the mother and the patient the fact that I am  concerned about the rash and they are to notify the nurse immediately if the  rash started spreading or if she has more headaches.      JEP/MEDQ  D:  12/11/2004  T:  12/12/2004  Job:  [id number]

## 2011-03-03 NOTE — Assessment & Plan Note (Signed)
[facility]                                 ON-CALL NOTE   Alisha Copeland, Alisha Copeland                     MRN:          [id number]  DATE:10/31/2008                            DOB:          1986/05/28    CARDIOLOGIST:  Pricilla Riffle, MD, [facility]   The patient's phone number is [phone].   HISTORY:  Alisha Copeland is a 25 year old female with a history of  dysautonomia (POTS).  She is followed by Dr. Tenny Craw.  She apparently  receives her prescription for Effexor from Dr. Tenny Craw.  The wrong type of  Effexor was called in recently.  She is supposed to take XR, but she was  given a short-acting form.  She has tried to take this, but it is making  her nauseated and she would like the medication changed.   PLAN:  I have called in Effexor XR 37.5 mg 3 tablets p.o. daily, #93  refills to [facility], [address], [place], [phone].   DISPOSITION:  As above.      Tereso Newcomer, PA-C  Electronically Signed      Madolyn Frieze. Jens Som, MD, [facility]  Electronically Signed   SW/MedQ  DD: 10/31/2008  DT: 11/01/2008  Job #: [id number]

## 2011-03-03 NOTE — Letter (Signed)
December 24, 2008     RE:  [name], [name]  MRN:  [id number]  /  DOB:  1985/12/03   To Whom It May Concern:   Alisha Copeland is a patient I follow in Cardiology Clinic.  She is a 25-year-  old woman with a long history of  severe dysautonomia.  She has postural  orthostatic tachycardia syndrome.  I have been involved in her care  along with other cardiologists Lynnda Child, MD, Janae Bridgeman, MD).  I  continue to follow her frequently as an outpatient.  Alisha Copeland has missed a significant amount of school in the past because  of her medical problems.  Last year she had to drop out while she was  recovering from the flu.   Alisha Copeland is doing  better now.  As her cardiologist, I  would recommend  she seek any extra assistance available so that her energies can be  concentrated toward school.  She will continue on medicines and I will  continue to follow her.  She may have setbacks in the future as her  reserve is diminished.  I am hoping though that she will continue to  improve.   If you have any questions, please feel free to contact me.      Sincerely,      Pricilla Riffle, MD, [facility]  Electronically Signed    PVR/MedQ  DD: 12/24/2008  DT: 12/24/2008  Job #: [id number]

## 2011-03-03 NOTE — Assessment & Plan Note (Signed)
[facility]                              CARDIOLOGY OFFICE NOTE   Alisha Copeland, Alisha Copeland                     MRN:          [id number]  DATE:07/16/2006                            DOB:          1986/08/19    IDENTIFICATION:  Alisha Copeland is a 25 year old with a history of dysautonomia.  I last saw her back in May.   In the interval, she has done very well.  She is active and going to school.  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  She has had one bad day  recently and she actually on September 21, became quite dizzy, nauseated and  blood pressure was actually high at 140/100 when she took it.  She stayed  quiet over the weekend and got back into her activities the following week  slower, but is back up to her normal speed and she is feeling okay now.  She  wonders if she is overdoing it.   CURRENT MEDICATIONS:  1. Clonidine patch 0.3.  2. Effexor 300 daily.  3. Calcium with D daily.   PHYSICAL EXAMINATION:  GENERAL:  The patient is in no distress.  VITAL SIGNS:  Blood pressure laying 125/79, pulse 83, sitting 123/82, pulse  92; standing at 0 minutes 137/87, pulse 81; standing at 2 minutes blood  pressure 133/87, pulse 102; standing at 5 minutes blood pressure 133/91,  pulse 101.  LUNGS:  Clear.  CARDIAC:  Regular rate and rhythm with no murmurs or extra heart sounds.  ABDOMEN:  Benign.  EXTREMITIES:  Warm with no edema.  Good pulses.   IMPRESSION:  Autonomic dysfunction.  The patient has a history of Pott  disease.  On examination today, she shows evidence that she still has some  pooling, but she is well-compensated and asymptomatic.  Blood pressure  actually has a little bit of a hypertensive or hyperadrenergic sign.  She is  doing fine though on the current regimen clinically and I am reluctant to  make any changes.  Her blood pressure though at home being high, questions  if maybe things will need to be changed.  I told her if she  has a spell like  what she did on September 21, again to keep an eye on her blood pressure and  keep a record of this.   PLAN:  No changes for now.  Encouraged her again to get adequate rest, drink  adequate fluids.  I will set to see her back in the Spring.  She has an  appointment with Dr. Berneice Gandy in January, she thinks.  The patient should have  a flu vaccine.            ______________________________  Pricilla Riffle, MD, [facility]     PVR/MedQ  DD:  07/16/2006  DT:  07/17/2006  Job #:  [id number]   cc:   Georgana Curio, M.D.

## 2011-03-03 NOTE — Discharge Summary (Signed)
NAMESHAELIN, LALLEY              ACCOUNT NO.:  [id number]   MEDICAL RECORD NO.:  [id number]          PATIENT TYPE:  OBV   LOCATION:  1610                         FACILITY:  [facility]   PHYSICIAN:  Rene Paci, M.D. LHCDATE OF BIRTH:  1985-11-20   DATE OF ADMISSION:  07/06/2004  DATE OF DISCHARGE:  07/07/2004                                 DISCHARGE SUMMARY   DISCHARGE DIAGNOSES:  1.  Acute mononucleosis, infectious, with mild increased liver function      tests, decreased platelets, and systemic fatigue, stable.  2.  History of postural tachycardia syndrome (POTS).  3.  History of primary hypoparathyroidism.  4.  History of migraines.   DISCHARGE MEDICATIONS:  1.  Phenergan 25 mg p.r.n., and otherwise as prior to admission to include:  2.  Clonidine patch TTS 3 change every week.  3.  Effexor XR 300 mg daily.  4.  Calcitrol 0.25 mcg two q.a.m. and one q.p.m.  5.  Os-Cal 500 mg b.i.d.  6.  Procrit 10,000 units every two weeks.  7.  Aspirin 81 mg daily.  8.  Naprosyn p.r.n. migraines.   DISPOSITION:  The patient is discharged home with her mother to continue bed  rest and minimal activity until clearance from her primary care physician.  She is tolerating p.o., liquids and solids, and not having significant  tachycardia.   FOLLOWUP:  With her primary care physician, Dr. Lutricia Horsfall, for Monday,  July 11, 2004, at 11:30 a.m., to re-evaluate symptoms.  The patient and  family understand to call on-call physician or return to the emergency room  for worsening tachycardia symptoms over the weekend should these arise or  intolerance of p.o.   HOSPITAL COURSE:  #1 -  ACUTE MONO:  The patient is a pleasant 25 year old  woman who had presented to her primary care physician for establishment of  care, transfer from pediatric service, on July 05, 2004, when she  complained of increasing fatigue.  The patient and her mother were concerned  about this due to the patient's  history of POTS, postural tachycardia  syndrome, which is a primary cardiac neuroendocrine problem, similar to  orthostasis, but the pressure remains stable, but uncontrolled and  symptomatic tachycardia arises with positional changes.  Her POTS is easily  exacerbated by acute illness.  Labs were done the day of outpatient visit  and returned the next day consistent with acute mono with positive Monospot,  mild increased transaminases, and mild decreased platelets.  Because of  this, and the patient's propensity to crash with acute illness, she was  referred for a 24 hour inpatient observation and IV fluids.  This was  performed, and fortunately the patient's heart rate remained stable at its  baseline with a change in postural heart rate from 79 to 100, both before  and after fluids.  The patient is tolerating p.o. liquids, diet, and  Phenergan as needed for nausea.  She is not having significant abdominal  pain or other symptoms other than fatigue.  The patient is felt stable for  discharge home.  Her LFT's were rising somewhat, but  this is to be expected  in the setting of acute mono.  The patient's platelet counts have remained  stable.  Please see below.  Close outpatient followup has been arranged with  primary care physician as above, along with instructions for return to  hospital if needed.  #2 -  OTHER MEDICAL ISSUES:  The patient remained on her chronic medications  for a history of migraines and hypoparathyroidism.  No changes were made in  this regimen.   DISCHARGE LABORATORY DATA:  Alkaline phosphatase of 132, AST of 317  (previously 85, 48 hours ago), ALT 348 (previously 97 two days ago).  Platelet count 134, stable over 48 hours.  White count 8.5 with 80%  lymphocytes.      VL/MEDQ  D:  07/07/2004  T:  07/07/2004  Job:  [id number]

## 2011-03-03 NOTE — Letter (Signed)
May 20, 2009    To Whom It May Concern   RE:  [name], [name]  MRN:  [id number]  /  DOB:  August 05, 1986   To Whom It May Concern,   Alisha Copeland is a 25 year old woman who I have followed for several years in  cardiology clinic.  She has a history of profound autonomic dysfunction.  She has also been followed at [facility]  and at the [place] [facility]  [place].  I last saw her in clinic back in June.  Since then, she has  started  to have a worsening of her autonomic function; it is limiting  her activities of daily living more than it had been.  She is  experiencing excessive fatigue, nausea, dizziness, loss of sleep.   [name] has lived with autonomic dysfunction for several years.  She  has lived through cycles of ups and downs in her health.  Right now, she  is having more problems.  I am hoping she will be able to cut back on  her hours until she regains her strength.  She is currently taking 12  credit hours.  I think backing down to 9 hours would take some of the  physical demand off of her.  I do not want her to drop out completely.  She has come this far in her education.  I am hopeful that she will  continue and complete her degree.  She has had marked improvements in  her physical symptoms overall.  This, I think represents a temporary  decline.   If you have any questions, please feel free to contact me at [phone].    Sincerely,     Pricilla Riffle, MD, [facility]  Electronically Signed   PVR/MedQ  DD: 07/21/2009  DT: 07/21/2009  Job #: [id number]

## 2011-07-17 LAB — HEPATIC FUNCTION PANEL
ALT: 12
AST: 18
Albumin: 3.6
Alkaline Phosphatase: 55
Bilirubin, Direct: <0.1
Total Bilirubin: 0.7
Total Protein: 6.1

## 2011-07-17 LAB — URINE MICROSCOPIC-ADD ON

## 2011-07-17 LAB — URINALYSIS, ROUTINE W REFLEX MICROSCOPIC
Bilirubin Urine: NEGATIVE
Glucose, UA: NEGATIVE
Ketones, ur: NEGATIVE
Leukocytes, UA: NEGATIVE
Nitrite: NEGATIVE
Protein, ur: NEGATIVE
Specific Gravity, Urine: 1.01
Urobilinogen, UA: 1
pH: 7

## 2011-07-17 LAB — CBC
HCT: 42
Hemoglobin: 14.6
MCHC: 34.7
MCV: 85.5
Platelets: 206
RBC: 4.91
RDW: 13.3
WBC: 4

## 2011-07-17 LAB — BASIC METABOLIC PANEL
BUN: 11
BUN: 11
CO2: 26
CO2: 27
Calcium: 7.1 — ABNORMAL LOW
Calcium: 7.5 — ABNORMAL LOW
Chloride: 101
Chloride: 106
Creatinine, Ser: 0.6
Creatinine, Ser: 0.67
GFR calc Af Amer: >60
GFR calc Af Amer: >60
GFR calc non Af Amer: >60
GFR calc non Af Amer: >60
Glucose, Bld: 91
Glucose, Bld: 97
Potassium: 3.6
Potassium: 3.6
Sodium: 138
Sodium: 139

## 2011-07-17 LAB — DIFFERENTIAL
Basophils Absolute: 0
Basophils Relative: 1
Eosinophils Absolute: 0.1
Eosinophils Relative: 2
Lymphocytes Relative: 39
Lymphs Abs: 1.6
Monocytes Absolute: 0.4
Monocytes Relative: 9
Neutro Abs: 2
Neutrophils Relative %: 50

## 2011-07-17 LAB — URINE CULTURE
Colony Count: NO GROWTH
Culture: NO GROWTH
Special Requests: NEGATIVE

## 2011-07-17 LAB — PHOSPHORUS: Phosphorus: 4.6

## 2011-07-17 LAB — MONONUCLEOSIS SCREEN: Mono Screen: NEGATIVE

## 2011-07-17 LAB — CALCIUM, IONIZED: Calcium, Ion: 0.97 — ABNORMAL LOW

## 2011-07-17 LAB — VITAMIN D 1,25 DIHYDROXY: Vit D, 1,25-Dihydroxy: 58 pg/mL (ref 15–75)

## 2011-07-23 ENCOUNTER — Inpatient Hospital Stay (INDEPENDENT_AMBULATORY_CARE_PROVIDER_SITE_OTHER)
Admission: RE | Admit: 2011-07-23 | Discharge: 2011-07-23 | Disposition: A | Discharge status: Home / Self Care | Payer: No Typology Code available for payment source | Source: Ambulatory Visit | Attending: Family Medicine | Admitting: Family Medicine

## 2011-07-23 DIAGNOSIS — S060X0A Concussion without loss of consciousness, initial encounter: Secondary | ICD-10-CM
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
## 2012-02-05 ENCOUNTER — Emergency Department (HOSPITAL_COMMUNITY)
Admission: EM | Admit: 2012-02-05 | Discharge: 2012-02-05 | Disposition: A | Discharge status: Home / Self Care | Payer: BC Managed Care – PPO | Source: Home / Self Care | Attending: Family Medicine | Admitting: Family Medicine

## 2012-02-05 ENCOUNTER — Encounter (HOSPITAL_COMMUNITY): Payer: Self-pay

## 2012-02-05 DIAGNOSIS — H73012 Bullous myringitis, left ear: Secondary | ICD-10-CM

## 2012-02-05 DIAGNOSIS — H73019 Bullous myringitis, unspecified ear: Secondary | ICD-10-CM

## 2012-02-05 HISTORY — DX: Hypoparathyroidism, unspecified: E20.9

## 2012-02-05 HISTORY — DX: Postural orthostatic tachycardia syndrome (POTS): G90.A

## 2012-02-05 HISTORY — DX: Tachycardia, unspecified: R00.0

## 2012-02-05 HISTORY — DX: Orthostatic hypotension: I95.1

## 2012-02-05 HISTORY — DX: Other specified cardiac arrhythmias: I49.8

## 2012-02-05 MED ORDER — CETIRIZINE HCL 10 MG PO TABS: 10.0000 mg | ORAL_TABLET | Freq: Every day | ORAL | Status: DC

## 2012-02-05 MED ORDER — ANTIPYRINE-BENZOCAINE 5.4-1.4 % OT SOLN: 3.0000 [drp] | Freq: Four times a day (QID) | OTIC | Status: AC | PRN

## 2012-02-05 MED ORDER — AMOXICILLIN 500 MG PO CAPS: 500.0000 mg | ORAL_CAPSULE | Freq: Three times a day (TID) | ORAL | Status: AC

## 2012-02-05 MED ORDER — FLUTICASONE PROPIONATE 50 MCG/ACT NA SUSP: 2.0000 | Freq: Every day | NASAL | Status: DC

## 2012-02-05 NOTE — Discharge Instructions (Signed)
  keep well hydrated. Take the prescribed medications as instructed. Can take ibuprofen over-the-counter scheduled every 8 hours for the next 24-48 hours take with food and plenty of liquids as it can upset your stomach, can alternate with Tylenol 500 mg every 6 hours as needed for pain. Use nasal saline spray at least 3 times a day. (simply saline is over the counter) can alternate with nasal steroid. Start the prescribed antibiotic only if no improvement of your symptoms after 48 hours. Followup with the ENT specialist if worsening symptoms despite following treatment.

## 2012-02-05 NOTE — ED Notes (Signed)
Pt states lt ear stopped up since Thursday- states she can't hear from lt ear.  Reports last week she had cold sx.

## 2012-02-05 NOTE — ED Provider Notes (Signed)
History     CSN: [id number]  Arrival date & time 02/05/12  1634   First MD Initiated Contact with Patient 02/05/12 1706      Chief Complaint  Patient presents with  . Ear Fullness    (Consider location/radiation/quality/duration/timing/severity/associated sxs/prior treatment) HPI Comments: 26 year old female with history of postural tachycardia syndrome. Here complaining of left ear pain for 4 days. Symptoms have been associated with nasal congestion, clear rhinorrhea and throat discomfort. Also sneezing and dry cough. Denies fever or chills. Has good appetite and energy level. Denies ear drainage. Blood pressure today is 180/109 patient taken adrenergic medications and states her "blood pressure goes up and down". Denies headache, chest pain , palpitations, dizziness, syncope, visual changes or malaise. She is having her adrenergic medication decreased by her PCP do to hypertension.    Past Medical History  Diagnosis Date  . POTS (postural orthostatic tachycardia syndrome)   . Hypoparathyroidism     History reviewed. No pertinent past surgical history.  No family history on file.  History  Substance Use Topics  . Smoking status: Never Smoker   . Smokeless tobacco: Not on file  . Alcohol Use: Yes    OB History    Grav Para Term Preterm Abortions TAB SAB Ect Mult Living                  Review of Systems  Constitutional: Negative for fever, chills, diaphoresis, appetite change and fatigue.  HENT: Positive for ear pain, congestion, sore throat and rhinorrhea. Negative for trouble swallowing, neck pain and ear discharge.   Eyes: Negative for pain and visual disturbance.  Respiratory: Negative for cough, shortness of breath and wheezing.   Cardiovascular: Negative for chest pain, palpitations and leg swelling.  Gastrointestinal: Negative for nausea, vomiting, abdominal pain, diarrhea and constipation.  Skin: Negative for rash.  Neurological: Negative for dizziness,  syncope, weakness, numbness and headaches.    Allergies  Beta adrenergic blockers; Ciprofloxacin; Drospirenone-ethinyl estradiol; Methylphenidate hcl; Nitrofurantoin; Sertraline hcl; Sulfamethoxazole w/trimethoprim; and Vancomycin  Home Medications   Current Outpatient Rx  Name Route Sig Dispense Refill  . ALPRAZOLAM 0.5 MG PO TABS Oral Take 0.5 mg by mouth at bedtime as needed.    . AZILSARTAN MEDOXOMIL 40 MG PO TABS Oral Take 40 mg by mouth.    . MONONESSA PO Oral Take by mouth.    . VENLAFAXINE HCL ER 37.5 MG PO CP24 Oral Take 75 mg by mouth daily.    . AMOXICILLIN 500 MG PO CAPS Oral Take 1 capsule (500 mg total) by mouth 3 (three) times daily. 21 capsule 0  . ANTIPYRINE-BENZOCAINE 5.4-1.4 % OT SOLN Left Ear Place 3 drops into the left ear 4 (four) times daily as needed for pain. 10 mL 0  . CETIRIZINE HCL 10 MG PO TABS Oral Take 1 tablet (10 mg total) by mouth daily. 30 tablet 0  . FLUTICASONE PROPIONATE 50 MCG/ACT NA SUSP Nasal Place 2 sprays into the nose daily. 16 g 0    BP 180/109  Pulse 93  Temp(Src) 98.2 F (36.8 C) (Oral)  Resp 16  SpO2 98%  LMP 01/30/2012  Physical Exam  Nursing note and vitals reviewed. Constitutional: She is oriented to person, place, and time. She appears well-developed and well-nourished. No distress.  HENT:  Head: Normocephalic and atraumatic.  Right Ear: External ear normal.  Left Ear: External ear normal.  Mouth/Throat: No oropharyngeal exudate.       Nasal Congestion with erythema and swelling of  nasal turbinates, clear rhinorrhea. mild pharyngeal erythema no exudates. No uvula deviation. No trismus. TM's right normal. Left with swelling erythema and bullae.   Eyes: Conjunctivae and EOM are normal. Pupils are equal, round, and reactive to light.       midriatic reactive pupils.  Neck: Neck supple. No JVD present.  Cardiovascular: Normal rate, regular rhythm and normal heart sounds.   Pulmonary/Chest: Effort normal and breath sounds  normal. She has no wheezes. She has no rales.  Lymphadenopathy:    She has no cervical adenopathy.  Neurological: She is alert and oriented to person, place, and time.  Skin: No rash noted.    ED Course  Procedures (including critical care time)  Labs Reviewed - No data to display No results found.   1. Bullous myringitis of left ear       MDM  Treated with auralgan, amoxicillin , cetirizine and Flonase. Also supportive care recommendations provided. ENT referral as needed. Follow up with PCP to monitor blood pressure reccommended.         Sharin Grave, MD 02/06/12 1044

## 2012-08-12 ENCOUNTER — Encounter (HOSPITAL_COMMUNITY): Payer: Self-pay

## 2012-08-12 ENCOUNTER — Emergency Department (HOSPITAL_COMMUNITY)
Admission: EM | Admit: 2012-08-12 | Discharge: 2012-08-12 | Disposition: A | Discharge status: Home / Self Care | Payer: BC Managed Care – PPO | Source: Home / Self Care

## 2012-08-12 DIAGNOSIS — J45909 Unspecified asthma, uncomplicated: Secondary | ICD-10-CM

## 2012-08-12 DIAGNOSIS — J9801 Acute bronchospasm: Secondary | ICD-10-CM

## 2012-08-12 DIAGNOSIS — I10 Essential (primary) hypertension: Secondary | ICD-10-CM

## 2012-08-12 MED ORDER — ALBUTEROL SULFATE (5 MG/ML) 0.5% IN NEBU
INHALATION_SOLUTION | RESPIRATORY_TRACT | Status: AC
  Filled 2012-08-12: qty 0.5

## 2012-08-12 MED ORDER — TRIAMCINOLONE ACETONIDE 40 MG/ML IJ SUSP
40.0000 mg | Freq: Once | INTRAMUSCULAR | Status: AC
  Administered 2012-08-12: 40 mg via INTRAMUSCULAR

## 2012-08-12 MED ORDER — TRIAMCINOLONE ACETONIDE 40 MG/ML IJ SUSP
INTRAMUSCULAR | Status: AC
  Filled 2012-08-12: qty 5

## 2012-08-12 MED ORDER — ALBUTEROL SULFATE HFA 108 (90 BASE) MCG/ACT IN AERS: 2.0000 | INHALATION_SPRAY | Freq: Four times a day (QID) | RESPIRATORY_TRACT | Status: DC | PRN

## 2012-08-12 MED ORDER — IPRATROPIUM BROMIDE 0.02 % IN SOLN
0.5000 mg | Freq: Once | RESPIRATORY_TRACT | Status: AC
  Administered 2012-08-12: 0.5 mg via RESPIRATORY_TRACT

## 2012-08-12 MED ORDER — ALBUTEROL SULFATE (5 MG/ML) 0.5% IN NEBU
2.5000 mg | INHALATION_SOLUTION | Freq: Once | RESPIRATORY_TRACT | Status: AC
  Administered 2012-08-12: 2.5 mg via RESPIRATORY_TRACT

## 2012-08-12 MED ORDER — BECLOMETHASONE DIPROPIONATE 40 MCG/ACT IN AERS: 2.0000 | INHALATION_SPRAY | Freq: Two times a day (BID) | RESPIRATORY_TRACT | Status: DC

## 2012-08-12 MED ORDER — METHYLPREDNISOLONE 4 MG PO KIT: PACK | ORAL | Status: DC

## 2012-08-12 NOTE — ED Notes (Signed)
Patient states that she has been having trouble breathing, states that she has had a cough for over a week

## 2012-08-12 NOTE — ED Provider Notes (Signed)
History     CSN: [id number]  Arrival date & time 08/12/12  1000   None     Chief Complaint  Patient presents with  . Wheezing    (Consider location/radiation/quality/duration/timing/severity/associated sxs/prior treatment) HPI Comments: 26 year old female with a remote history of asthma presents with a cough for 2 weeks. This is associated with more recent wheezing with shortness of breath. She states that the wheezing is episodic but also occurs after initiation of exercise. She's been out of her albuterol inhaler for sometime now. She also has a history of hypertension and related POTS that often results and hypertension and arrhythmias. She is supposed to be on a medicine for that prescribed by her cardiologist that she has stopped taking that has not follow up with cardiologist as she was supposed to. This is not a primary concern for her today.  Patient is a 26 y.o. female presenting with wheezing.  Wheezing  Associated symptoms include rhinorrhea, cough, shortness of breath and wheezing. Pertinent negatives include no fever and no sore throat.    Past Medical History  Diagnosis Date  . POTS (postural orthostatic tachycardia syndrome)   . Hypoparathyroidism     History reviewed. No pertinent past surgical history.  No family history on file.  History  Substance Use Topics  . Smoking status: Never Smoker   . Smokeless tobacco: Not on file  . Alcohol Use: Yes    OB History    Grav Para Term Preterm Abortions TAB SAB Ect Mult Living                  Review of Systems  Constitutional: Positive for chills and activity change. Negative for fever, appetite change and fatigue.  HENT: Positive for rhinorrhea and postnasal drip. Negative for sore throat, facial swelling, drooling, trouble swallowing, neck pain and neck stiffness.   Eyes: Negative.   Respiratory: Positive for cough, shortness of breath and wheezing. Negative for choking and chest tightness.     Cardiovascular: Negative.   Gastrointestinal: Negative.   Musculoskeletal: Negative.   Skin: Negative for pallor and rash.  Neurological: Negative.     Allergies  Beta adrenergic blockers; Ciprofloxacin; Drospirenone-ethinyl estradiol; Methylphenidate hcl; Nitrofurantoin; Sertraline hcl; Sulfamethoxazole w-trimethoprim; and Vancomycin  Home Medications   Current Outpatient Rx  Name Route Sig Dispense Refill  . ALPRAZOLAM 0.5 MG PO TABS Oral Take 0.5 mg by mouth at bedtime as needed.    Marland Kitchen CETIRIZINE HCL 10 MG PO TABS Oral Take 1 tablet (10 mg total) by mouth daily. 30 tablet 0  . FLUTICASONE PROPIONATE 50 MCG/ACT NA SUSP Nasal Place 2 sprays into the nose daily. 16 g 0  . MONONESSA PO Oral Take by mouth.    . VENLAFAXINE HCL ER 37.5 MG PO CP24 Oral Take 75 mg by mouth daily.    . ALBUTEROL SULFATE HFA 108 (90 BASE) MCG/ACT IN AERS Inhalation Inhale 2 puffs into the lungs every 6 (six) hours as needed for wheezing. 1 Inhaler 1  . AZILSARTAN MEDOXOMIL 40 MG PO TABS Oral Take 40 mg by mouth.    . BECLOMETHASONE DIPROPIONATE 40 MCG/ACT IN AERS Inhalation Inhale 2 puffs into the lungs 2 (two) times daily. 1 Inhaler 12  . METHYLPREDNISOLONE 4 MG PO KIT  As directed. Start 08/13/12 21 tablet 0    BP 168/112  Pulse 108  Temp 98.7 F (37.1 C) (Oral)  Resp 16  SpO2 99%  LMP 07/13/2012  Physical Exam  Constitutional: She is oriented to person,  place, and time. She appears well-developed and well-nourished. No distress.  HENT:  Head: Normocephalic and atraumatic.  Right Ear: External ear normal.  Left Ear: External ear normal.       TMs pearly gray, transparent, normal light reflex, no retraction or effusions. OP with mild cobblestoning and clear PND  Eyes: Conjunctivae normal and EOM are normal. Right eye exhibits no discharge. Left eye exhibits no discharge.  Neck: Normal range of motion. Neck supple.  Cardiovascular: Normal rate, regular rhythm and normal heart sounds.    Pulmonary/Chest: Effort normal. No respiratory distress. She has wheezes. She has no rales.  Musculoskeletal: Normal range of motion. She exhibits no edema.  Lymphadenopathy:    She has no cervical adenopathy.  Neurological: She is alert and oriented to person, place, and time.  Skin: Skin is warm and dry. No rash noted.  Psychiatric: She has a normal mood and affect.    ED Course  Procedures (including critical care time)  Labs Reviewed - No data to display No results found.   1. Acute bronchospasm   2. Asthma   3. HTN (hypertension)       MDM  call your PCP for an appointment as soon as possible to help manage her bronchospasm and asthma. You also need to have your hypertension managed and restart your medications. If her cardiologist prescribed this medication he also need to obtain an appointment with her. Duo neb. After the initial DuoNeb she states she is breathing a little better. She does have a partial decrease in wheezing. However, there is still significant wheezing and prolonged expiratory phase so I will administer a second albuterol nebulizer 20 minutes after the completion of the duo neb. After the second albuterol nebulizer she states she is breathing a little bit better and her wheezing has also decreased some but have not abated. She states she is ready to go. Kenalog 40 mg IM now Medrol Dosepak to start tomorrow Rx for albuterol HFA 2 puffs every 4 hours when necessary cough and wheeze Qvar 42 puffs twice a day        Hayden Rasmussen, NP 08/12/12 1242

## 2012-08-13 NOTE — ED Provider Notes (Signed)
Medical screening examination/treatment/procedure(s) were performed by non-physician practitioner and as supervising physician I was immediately available for consultation/collaboration.  Leslee Home, M.D.   Reuben Likes, MD 08/13/12 1054

## 2012-09-26 DIAGNOSIS — K2970 Gastritis, unspecified, without bleeding: Secondary | ICD-10-CM | POA: Insufficient documentation

## 2012-09-26 DIAGNOSIS — I10 Essential (primary) hypertension: Secondary | ICD-10-CM | POA: Insufficient documentation

## 2012-09-26 DIAGNOSIS — D229 Melanocytic nevi, unspecified: Secondary | ICD-10-CM | POA: Insufficient documentation

## 2012-09-26 DIAGNOSIS — N926 Irregular menstruation, unspecified: Secondary | ICD-10-CM | POA: Insufficient documentation

## 2012-10-28 ENCOUNTER — Encounter: Payer: Self-pay | Admitting: Internal Medicine

## 2012-10-28 ENCOUNTER — Ambulatory Visit (INDEPENDENT_AMBULATORY_CARE_PROVIDER_SITE_OTHER): Payer: BC Managed Care – PPO | Admitting: Internal Medicine

## 2012-10-28 VITALS — BP 163/105 | HR 78 | Ht 67.0 in | Wt 129.0 lb

## 2012-10-28 DIAGNOSIS — G90A Postural orthostatic tachycardia syndrome (POTS): Secondary | ICD-10-CM

## 2012-10-28 DIAGNOSIS — I951 Orthostatic hypotension: Secondary | ICD-10-CM

## 2012-10-28 DIAGNOSIS — R000 Tachycardia, unspecified: Secondary | ICD-10-CM

## 2012-10-28 LAB — BASIC METABOLIC PANEL
GFR: 128.01 mL/min (ref >60.00)
Potassium: 3.8 mEq/L (ref 3.5–5.1)
Sodium: 137 mEq/L (ref 135–145)

## 2012-10-28 MED ORDER — AMLODIPINE BESYLATE 2.5 MG PO TABS: 2.5000 mg | ORAL_TABLET | Freq: Every day | ORAL | Status: DC

## 2012-10-28 NOTE — Patient Instructions (Signed)
Start Norvasc 2.5 mg every day  Lab work today We will call you with results  Follow up with Dr.Ross in 3 weeks.

## 2012-10-28 NOTE — Progress Notes (Signed)
HPI Patient is a 27 yo with a history of dysautonomia.  I saw her in clinic several years ago  She has been followed by several physiciaans  When I saw her last in 2008 she was on low dose clonidine and her BP was controlled She was most recently on Edarbi and Micardis.  Did not toleraate  Felt fatigued. Her BP has been high Breathing is OK  Biggest complaint is N/V  Daily  Seen recently at [facility] for hypoparathyroidsm.  Started on Zantac  Has"taken the edge of"  Does note reflux. The patient has been working 2 jobs  Quit one  Now just working in Engineering geologist.  Denies signif dizziness.  No palpitations.  Allergies  Allergen Reactions  . Beta Adrenergic Blockers   . Ciprofloxacin   . Drospirenone-Ethinyl Estradiol   . Methylphenidate Hcl   . Nitrofurantoin   . Sertraline Hcl   . Sulfamethoxazole W-Trimethoprim   . Vancomycin     Current Outpatient Prescriptions  Medication Sig Dispense Refill  . albuterol (PROVENTIL HFA;VENTOLIN HFA) 108 (90 BASE) MCG/ACT inhaler Inhale 2 puffs into the lungs every 6 (six) hours as needed for wheezing.  1 Inhaler  1  . ALPRAZolam (XANAX) 0.5 MG tablet Take 0.5 mg by mouth at bedtime as needed.      . Azilsartan Medoxomil (EDARBI) 40 MG TABS Take 40 mg by mouth.      . beclomethasone (QVAR) 40 MCG/ACT inhaler Inhale 2 puffs into the lungs 2 (two) times daily.  1 Inhaler  12  . calcitRIOL (ROCALTROL) 0.25 MCG capsule Take 0.25 mcg by mouth daily. Pt takes tid      . calcium carbonate (OS-CAL) 600 MG TABS Take 600 mg by mouth 2 (two) times daily with a meal.      . fluticasone (FLONASE) 50 MCG/ACT nasal spray Place 2 sprays into the nose daily.  16 g  0  . Norgestimate-Eth Estradiol (MONONESSA PO) Take by mouth.      . ranitidine (ZANTAC) 150 MG capsule Take 150 mg by mouth every evening.      . venlafaxine XR (EFFEXOR-XR) 37.5 MG 24 hr capsule Take 75 mg by mouth daily.        Past Medical History  Diagnosis Date  . POTS (postural  orthostatic tachycardia syndrome)   . Hypoparathyroidism     No past surgical history on file.  No family history on file.  History   Social History  . Marital Status: Single    Spouse Name: N/A    Number of Children: N/A  . Years of Education: N/A   Occupational History  . Not on file.   Social History Main Topics  . Smoking status: Never Smoker   . Smokeless tobacco: Not on file  . Alcohol Use: Yes  . Drug Use: No  . Sexually Active: Yes    Birth Control/ Protection: Pill   Other Topics Concern  . Not on file   Social History Narrative  . No narrative on file    Review of Systems:  All systems reviewed.  They are negative to the above problem except as previously stated.  Vital Signs: BP 163/105  Pulse 78  Ht 5\' 7"  (1.702 m)  Wt 129 lb (58.514 kg)  BMI 20.20 kg/m2  Physical Exam Patient is a thin 27 yo in NAD  HEENT:  Normocephalic, atraumatic. EOMI, PERRLA.  Neck: JVP is normal.  No bruits.  Lungs: clear to auscultation. No rales  no wheezes.  Heart: Regular rate and rhythm. Normal S1, S2. No S3.   No significant murmurs. PMI not displaced.  Abdomen:  Supple, nontender. Normal bowel sounds. No masses. No hepatomegaly.  Extremities:   Good distal pulses throughout. No lower extremity edema.  Musculoskeletal :moving all extremities.  Neuro:   alert and oriented x3.  CN II-XII grossly intact.  EKG  SR 88 bpm.  Assessment and Plan:  I have known [facility] for a long time.  Unfortunately she is not taking care of her heatlth problems.  Today she seems a little scattered in thinking.    1.  HTN  Very high  I would recomm low dose amlodipine 2.5 mg.  I would not push faster given her history of orthostatic hypotension.  Wil f/u in a few wks Unfortunately she is on effexor.  This can rasie BP  Unfortunately she says she has not done well on other agents.  2.  GI  Will refer to  GI   Keep on Zantac  3.  Hypoparathyroidism.  Seen by Dr Alisha Copeland.

## 2012-11-18 ENCOUNTER — Ambulatory Visit: Payer: BC Managed Care – PPO | Admitting: Internal Medicine

## 2012-11-25 ENCOUNTER — Encounter: Payer: Self-pay | Admitting: Internal Medicine

## 2012-11-25 ENCOUNTER — Ambulatory Visit (INDEPENDENT_AMBULATORY_CARE_PROVIDER_SITE_OTHER): Payer: BC Managed Care – PPO | Admitting: Internal Medicine

## 2012-11-25 ENCOUNTER — Telehealth: Payer: Self-pay | Admitting: Internal Medicine

## 2012-11-25 ENCOUNTER — Telehealth: Payer: Self-pay | Admitting: *Deleted

## 2012-11-25 VITALS — BP 162/116 | HR 85 | Ht 67.0 in | Wt 126.8 lb

## 2012-11-25 DIAGNOSIS — G90A Postural orthostatic tachycardia syndrome (POTS): Secondary | ICD-10-CM

## 2012-11-25 DIAGNOSIS — I951 Orthostatic hypotension: Secondary | ICD-10-CM

## 2012-11-25 DIAGNOSIS — I10 Essential (primary) hypertension: Secondary | ICD-10-CM

## 2012-11-25 DIAGNOSIS — R000 Tachycardia, unspecified: Secondary | ICD-10-CM

## 2012-11-25 DIAGNOSIS — R112 Nausea with vomiting, unspecified: Secondary | ICD-10-CM

## 2012-11-25 MED ORDER — CLONIDINE HCL 0.1 MG/24HR TD PTWK: 1.0000 | MEDICATED_PATCH | TRANSDERMAL | Status: DC

## 2012-11-25 MED ORDER — AMLODIPINE BESYLATE 5 MG PO TABS: 5.0000 mg | ORAL_TABLET | Freq: Every day | ORAL | Status: DC

## 2012-11-25 NOTE — Telephone Encounter (Signed)
Wants patient scheduled for chronic nausea. Scheduled on 12/04/12 at 8:30 AM.

## 2012-11-25 NOTE — Patient Instructions (Addendum)
You have been referred to Alisha Sar, MD with [facility].  Increase your Amlodipine to 5mg  daily.  Start Catapres TTS patch 0.1mg  weekly.

## 2012-11-25 NOTE — Telephone Encounter (Signed)
Appt made to see Dr. Tenny Craw Today.

## 2012-11-25 NOTE — Telephone Encounter (Signed)
Pt calling re having fast heart rate, nausea, pls advise
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
## 2012-11-27 ENCOUNTER — Encounter: Payer: Self-pay | Admitting: *Deleted

## 2012-11-30 ENCOUNTER — Other Ambulatory Visit: Payer: Self-pay

## 2012-12-04 ENCOUNTER — Other Ambulatory Visit (INDEPENDENT_AMBULATORY_CARE_PROVIDER_SITE_OTHER): Payer: BC Managed Care – PPO

## 2012-12-04 ENCOUNTER — Encounter: Payer: Self-pay | Admitting: Internal Medicine

## 2012-12-04 ENCOUNTER — Encounter: Payer: Self-pay | Admitting: *Deleted

## 2012-12-04 ENCOUNTER — Ambulatory Visit (INDEPENDENT_AMBULATORY_CARE_PROVIDER_SITE_OTHER): Payer: BC Managed Care – PPO | Admitting: Internal Medicine

## 2012-12-04 VITALS — BP 128/68 | HR 111 | Ht 67.0 in | Wt 126.0 lb

## 2012-12-04 DIAGNOSIS — G909 Disorder of the autonomic nervous system, unspecified: Secondary | ICD-10-CM

## 2012-12-04 DIAGNOSIS — R110 Nausea: Secondary | ICD-10-CM

## 2012-12-04 DIAGNOSIS — G901 Familial dysautonomia [Riley-Day]: Secondary | ICD-10-CM

## 2012-12-04 LAB — CBC WITH DIFFERENTIAL/PLATELET
Basophils Absolute: 0 10*3/uL (ref 0.0–0.1)
Eosinophils Relative: 0.4 % (ref 0.0–5.0)
Monocytes Absolute: 0.2 10*3/uL (ref 0.1–1.0)
Monocytes Relative: 2.3 % — ABNORMAL LOW (ref 3.0–12.0)
Neutrophils Relative %: 80.5 % — ABNORMAL HIGH (ref 43.0–77.0)
Platelets: 275 10*3/uL (ref 150.0–400.0)
RDW: 13.6 % (ref 11.5–14.6)
WBC: 8.4 10*3/uL (ref 4.5–10.5)

## 2012-12-04 LAB — IBC PANEL
Iron: 47 ug/dL (ref 42–145)
Transferrin: 379.3 mg/dL — ABNORMAL HIGH (ref 212.0–360.0)

## 2012-12-04 LAB — VITAMIN B12: Vitamin B-12: 265 pg/mL (ref 211–911)

## 2012-12-04 LAB — AMYLASE: Amylase: 120 U/L (ref 27–131)

## 2012-12-04 MED ORDER — ONDANSETRON HCL 4 MG PO TABS: 4.0000 mg | ORAL_TABLET | Freq: Three times a day (TID) | ORAL | Status: DC | PRN

## 2012-12-04 MED ORDER — RANITIDINE HCL 300 MG PO TABS: 300.0000 mg | ORAL_TABLET | Freq: Every day | ORAL | Status: DC

## 2012-12-04 NOTE — Progress Notes (Signed)
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 She was diagnosed with POTs syndrome about 15 years ago at age 27 and has been followed by Dr.Ross. She is here today because of nausea which is intermittent and occurs at least once a week in the mornings. She has occasional vomiting or just retching but no actual vomiting of any food. On one occasion, she vomited a large amount of food which she ate that day. She usually feels better after vomiting. She has occasional epigastric discomfort associated with nausea. There has been no change in her bowel habits which are regular. She has lost about 10 pounds from her usual 135 pounds to a current 126 pounds. She has not missed any work. Her medications have included Zantac 150 mg on an as necessary basis which she takes only once or twice a week. She also was on Zofran in the past. She remembers that her mother told her that she had delayed gastric emptying based on an upper GI series which was done many years ago. She denies dysphagia or odynophagia. There is no family history of gallbladder disease. She has been on Effexor for many years and Xanax 0.5 mg once or twice a week. She was hospitalized in 2005 with mononucleosis and in 2006 with influenza A and in September 2009 with bronchitis. At one point in 2005, she had placement of a feeding tube for nutritional support.   Past Medical History  Diagnosis Date  . POTS (postural orthostatic tachycardia syndrome)   . Hypoparathyroidism   . GERD (gastroesophageal reflux disease)   . Asthma   . Hypertension    History reviewed. No pertinent past surgical history.  reports that she has never smoked. She has never used smokeless tobacco. She reports that  drinks alcohol. She reports that she does not use illicit drugs. family history includes Crohn's disease in her maternal grandmother; Diabetes in her maternal  grandmother; Hypertension in her father and paternal grandfather; Irritable bowel syndrome in her mother; and Lung cancer in her maternal grandfather. Allergies  Allergen Reactions  . Beta Adrenergic Blockers   . Ciprofloxacin   . Drospirenone-Ethinyl Estradiol   . Methylphenidate Hcl   . Nitrofurantoin   . Sertraline Hcl   . Sulfamethoxazole W-Trimethoprim   . Vancomycin         Review of Systems:Negative for diarrhea, rectal bleeding. Positive family history of Crohn's disease in her maternal grandmother , positive for weight loss   The remainder of the 10 point ROS is negative except as outlined in H&P   Physical Exam: General appearance  Well developed, in no distress. appears healthy but somewhat frail very pleasant  Eyes- non icteric. HEENT nontraumatic, normocephalic. Mouth no lesions, tongue papillated, no cheilosis. Neck supple without adenopathy, thyroid not enlarged, no carotid bruits, no JVD. Lungs Clear to auscultation bilaterally. Cor normal S1, normal S2, regular rhythm, no murmur,  quiet precordium. Abdomen:Scaphoid soft nontender with normoactive bowel sounds. No distention. Liver edge at costal margin.   Rectal: not done because patient started her period today.  Extremities no pedal edema. Skin no lesions. Neurological alert and oriented x 3. Psychological normal mood and affect.  Assessment and Plan:  Problem #72 27 year old white female with dysautonomia, tachycardia and labile hypertension. She has had chronic nausea off and on in the past and more so recently. We will evaluate her for delayed gastric emptying,  for peptic ulcer disease and for biliary dysfunction. I have asked her to take ranitidine 300 mg at bedtime every day and prescribe Zofran 4 mg to take when necessary. We will also schedule an upper endoscopy and appropriate biopsies. We will also check her CBC, iron studies and B12. We will also check her sprue profile and schedule an upper abdominal  ultrasound. She agrees with the plan. She may eventually need a gastric emptying scan.   12/04/2012 Lina Sar

## 2012-12-04 NOTE — Patient Instructions (Addendum)
You have been scheduled for an endoscopy with propofol. Please follow written instructions given to you at your visit today. If you use inhalers (even only as needed) or a CPAP machine, please bring them with you on the day of your procedure.  You have been scheduled for an abdominal ultrasound at [facility] (1st floor of hospital) on Friday 12/06/12 at 8:00 am. Please arrive 15 minutes prior to your appointment for registration. Make certain not to have anything to eat or drink 6 hours prior to your appointment. Should you need to reschedule your appointment, please contact radiology at [phone]. This test typically takes about 30 minutes to perform.  Your physician has requested that you go to the basement for the following lab work before leaving today: Amylase, Lipase, B12, CBC, IBC, Celiac 10  We have sent the following medications to your pharmacy for you to pick up at your convenience: Ranitadine 300 mg Zofran  CC: Dr Dwana Melena, Dr Dietrich Pates

## 2012-12-05 LAB — CELIAC PANEL 10
Endomysial Screen: NEGATIVE
IgA: 202 mg/dL (ref 69–380)
Tissue Transglut Ab: 15.8 U/mL (ref <20)

## 2012-12-06 ENCOUNTER — Ambulatory Visit (HOSPITAL_COMMUNITY)
Admission: RE | Admit: 2012-12-06 | Discharge: 2012-12-06 | Disposition: A | Discharge status: Home / Self Care | Payer: BC Managed Care – PPO | Source: Ambulatory Visit | Attending: Internal Medicine | Admitting: Internal Medicine

## 2012-12-06 DIAGNOSIS — R1310 Dysphagia, unspecified: Secondary | ICD-10-CM | POA: Insufficient documentation

## 2012-12-06 DIAGNOSIS — R110 Nausea: Secondary | ICD-10-CM

## 2012-12-10 ENCOUNTER — Telehealth: Payer: Self-pay | Admitting: Internal Medicine
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
## 2012-12-30 ENCOUNTER — Ambulatory Visit: Payer: BC Managed Care – PPO | Admitting: Internal Medicine

## 2012-12-31 ENCOUNTER — Encounter: Payer: BC Managed Care – PPO | Admitting: Internal Medicine

## 2013-01-24 ENCOUNTER — Ambulatory Visit (INDEPENDENT_AMBULATORY_CARE_PROVIDER_SITE_OTHER): Payer: BC Managed Care – PPO | Admitting: Internal Medicine

## 2013-01-24 ENCOUNTER — Encounter: Payer: Self-pay | Admitting: Internal Medicine

## 2013-01-24 VITALS — BP 160/110 | HR 66 | Ht 67.0 in | Wt 131.8 lb

## 2013-01-24 DIAGNOSIS — G909 Disorder of the autonomic nervous system, unspecified: Secondary | ICD-10-CM

## 2013-01-24 DIAGNOSIS — G901 Familial dysautonomia [Riley-Day]: Secondary | ICD-10-CM

## 2013-01-24 DIAGNOSIS — I10 Essential (primary) hypertension: Secondary | ICD-10-CM

## 2013-01-24 MED ORDER — CARVEDILOL 3.125 MG PO TABS: 3.1250 mg | ORAL_TABLET | Freq: Two times a day (BID) | ORAL | Status: DC

## 2013-01-24 NOTE — Progress Notes (Signed)
HPI Alisha Copeland is a 27 yo with a history of dysautonomia.  SHe now has significant hypertension I saw her about 1 month ago.  I added a clonidine patch to her regimen She did not fill as it cost too much. SInce seen she denies dizziness.  [name] is OK Allergies  Allergen Reactions  . Beta Adrenergic Blockers   . Ciprofloxacin   . Drospirenone-Ethinyl Estradiol   . Methylphenidate Hcl   . Nitrofurantoin   . Sertraline Hcl   . Sulfamethoxazole W-Trimethoprim   . Vancomycin     Current Outpatient Prescriptions  Medication Sig Dispense Refill  . albuterol (PROVENTIL HFA;VENTOLIN HFA) 108 (90 BASE) MCG/ACT inhaler Inhale 2 puffs into the lungs every 6 (six) hours as needed for wheezing.  1 Inhaler  1  . ALPRAZolam (XANAX) 0.5 MG tablet Take 0.5 mg by mouth at bedtime as needed.      Marland Kitchen amLODipine (NORVASC) 5 MG tablet Take 1 tablet (5 mg total) by mouth daily.  90 tablet  3  . Azilsartan Medoxomil (EDARBI) 40 MG TABS Take 40 mg by mouth.      . beclomethasone (QVAR) 40 MCG/ACT inhaler Inhale 2 puffs into the lungs 2 (two) times daily.  1 Inhaler  12  . calcitRIOL (ROCALTROL) 0.25 MCG capsule Take 0.25 mcg by mouth daily. Pt takes tid      . calcium carbonate (OS-CAL) 600 MG TABS Take 600 mg by mouth 2 (two) times daily with a meal.      . fluticasone (FLONASE) 50 MCG/ACT nasal spray Place 2 sprays into the nose daily.  16 g  0  . Norgestimate-Eth Estradiol (MONONESSA PO) Take by mouth.      . ondansetron (ZOFRAN) 4 MG tablet Take 1 tablet (4 mg total) by mouth every 8 (eight) hours as needed for nausea.  30 tablet  0  . ranitidine (ZANTAC) 300 MG tablet Take 1 tablet (300 mg total) by mouth at bedtime.  30 tablet  2  . venlafaxine XR (EFFEXOR-XR) 37.5 MG 24 hr capsule Take 75 mg by mouth daily.       No current facility-administered medications for this visit.    Past Medical History  Diagnosis Date  . POTS (postural orthostatic tachycardia syndrome)   . Hypoparathyroidism   . GERD  (gastroesophageal reflux disease)   . Asthma   . Hypertension     No past surgical history on file.  Family History  Problem Relation Age of Onset  . Hypertension Father   . Hypertension Paternal Grandfather   . Lung cancer Maternal Grandfather   . Diabetes Maternal Grandmother   . Crohn's disease Maternal Grandmother   . Irritable bowel syndrome Mother     History   Social History  . Marital Status: Single    Spouse Name: N/A    Number of Children: N/A  . Years of Education: N/A   Occupational History  . Not on file.   Social History Main Topics  . Smoking status: Never Smoker   . Smokeless tobacco: Never Used  . Alcohol Use: Yes     Comment: occassionaly  . Drug Use: No  . Sexually Active: Yes    Birth Control/ Protection: Pill   Other Topics Concern  . Not on file   Social History Narrative  . No narrative on file    Review of Systems:  All systems reviewed.  They are negative to the above problem except as previously stated.  Vital Signs: BP 160/110  Pulse 66  Ht 5\' 7"  (1.702 m)  Wt 131 lb 12.8 oz (59.784 kg)  BMI 20.64 kg/m2  Physical Exam Patient is in NAD HEENT:  Normocephalic, atraumatic. EOMI, PERRLA.  Neck: JVP is normal.  No bruits.  Lungs: clear to auscultation. No rales no wheezes.  Heart: Regular rate and rhythm. Normal S1, S2. No S3.   No significant murmurs. PMI not displaced.  Abdomen:  Supple, nontender. Normal bowel sounds. No masses. No hepatomegaly.  Extremities:   Good distal pulses throughout. No lower extremity edema.  Musculoskeletal :moving all extremities.  Neuro:   alert and oriented x3.  CN II-XII grossly intact.   Assessment and Plan:  HTN  I would keep on norvasc and add coreg to regimen 3.125 mg per day.  She should call in a few wks with BP

## 2013-01-29 ENCOUNTER — Ambulatory Visit (AMBULATORY_SURGERY_CENTER): Payer: BC Managed Care – PPO | Admitting: Internal Medicine

## 2013-01-29 ENCOUNTER — Encounter: Payer: Self-pay | Admitting: Internal Medicine

## 2013-01-29 ENCOUNTER — Telehealth: Payer: Self-pay | Admitting: *Deleted

## 2013-01-29 ENCOUNTER — Other Ambulatory Visit: Payer: Self-pay | Admitting: *Deleted

## 2013-01-29 VITALS — BP 159/104 | HR 68 | Temp 97.6°F | Resp 20 | Ht 67.0 in | Wt 126.0 lb

## 2013-01-29 DIAGNOSIS — K2990 Gastroduodenitis, unspecified, without bleeding: Secondary | ICD-10-CM

## 2013-01-29 DIAGNOSIS — K2970 Gastritis, unspecified, without bleeding: Secondary | ICD-10-CM

## 2013-01-29 DIAGNOSIS — R112 Nausea with vomiting, unspecified: Secondary | ICD-10-CM

## 2013-01-29 DIAGNOSIS — R110 Nausea: Secondary | ICD-10-CM

## 2013-01-29 MED ORDER — SODIUM CHLORIDE 0.9 % IV SOLN: 500.0000 mL | INTRAVENOUS | Status: DC

## 2013-01-29 NOTE — Progress Notes (Signed)
Patient did not experience any of the following events: a burn prior to discharge; a fall within the facility; wrong site/side/patient/procedure/implant event; or a hospital transfer or hospital admission upon discharge from the facility. (G8907) Patient did not have preoperative order for IV antibiotic SSI prophylaxis. (G8918)  

## 2013-01-29 NOTE — Progress Notes (Signed)
No egg or soy allergy. emw 

## 2013-01-29 NOTE — Telephone Encounter (Signed)
Per Dr. Juanda Chance, patient needs GES scheduled. Scheduled at [facility] radiology on 02/12/13 at 7:00 AM. NPO 6 hours prior and no stomach medications 24 hours prior(Cheryl) Annette in [facility] recovery given procedure date, time and instructions to give patient.

## 2013-01-29 NOTE — Progress Notes (Signed)
Called to room to assist during endoscopic procedure.  Patient ID and intended procedure confirmed with present staff. Received instructions for my participation in the procedure from the performing physician.  

## 2013-01-29 NOTE — Patient Instructions (Addendum)
Discharge instructions given with verbal understanding. Biopsies taken. Office will schedule GE scan. Resume previous medications. YOU HAD AN ENDOSCOPIC PROCEDURE TODAY AT THE [facility]: Refer to the procedure report that was given to you for any specific questions about what was found during the examination.  If the procedure report does not answer your questions, please call your gastroenterologist to clarify.  If you requested that your care partner not be given the details of your procedure findings, then the procedure report has been included in a sealed envelope for you to review at your convenience later.  YOU SHOULD EXPECT: Some feelings of bloating in the abdomen. Passage of more gas than usual.  Walking can help get rid of the air that was put into your GI tract during the procedure and reduce the bloating. If you had a lower endoscopy (such as a colonoscopy or flexible sigmoidoscopy) you may notice spotting of blood in your stool or on the toilet paper. If you underwent a bowel prep for your procedure, then you may not have a normal bowel movement for a few days.  DIET: Your first meal following the procedure should be a light meal and then it is ok to progress to your normal diet.  A half-sandwich or bowl of soup is an example of a good first meal.  Heavy or fried foods are harder to digest and may make you feel nauseous or bloated.  Likewise meals heavy in dairy and vegetables can cause extra gas to form and this can also increase the bloating.  Drink plenty of fluids but you should avoid alcoholic beverages for 24 hours.  ACTIVITY: Your care partner should take you home directly after the procedure.  You should plan to take it easy, moving slowly for the rest of the day.  You can resume normal activity the day after the procedure however you should NOT DRIVE or use heavy machinery for 24 hours (because of the sedation medicines used during the test).    SYMPTOMS TO REPORT  IMMEDIATELY: A gastroenterologist can be reached at any hour.  During normal business hours, 8:30 AM to 5:00 PM Monday through Friday, call [phone].  After hours and on weekends, please call the GI answering service at [phone] who will take a message and have the physician on call contact you.   Following upper endoscopy (EGD)  Vomiting of blood or coffee ground material  New chest pain or pain under the shoulder blades  Painful or persistently difficult swallowing  New shortness of breath  Fever of 100F or higher  Black, tarry-looking stools  FOLLOW UP: If any biopsies were taken you will be contacted by phone or by letter within the next 1-3 weeks.  Call your gastroenterologist if you have not heard about the biopsies in 3 weeks.  Our staff will call the home number listed on your records the next business day following your procedure to check on you and address any questions or concerns that you may have at that time regarding the information given to you following your procedure. This is a courtesy call and so if there is no answer at the home number and we have not heard from you through the emergency physician on call, we will assume that you have returned to your regular daily activities without incident.  SIGNATURES/CONFIDENTIALITY: You and/or your care partner have signed paperwork which will be entered into your electronic medical record.  These signatures attest to the fact that that the  information above on your After Visit Summary has been reviewed and is understood.  Full responsibility of the confidentiality of this discharge information lies with you and/or your care-partner.

## 2013-01-29 NOTE — Op Note (Signed)
[facility] [address] [place], [address]   ENDOSCOPY PROCEDURE REPORT  PATIENT: Alisha Copeland, Alisha Copeland  MR#: [id number] BIRTHDATE: [birth year] , 26  yrs. old GENDER: Female ENDOSCOPIST: Hart Carwin, MD REFERRED BY:  Catalina Pizza, M.D., Dr Dietrich Pates PROCEDURE DATE:  01/29/2013 PROCEDURE:  EGD w/ biopsy ASA CLASS:     Class II INDICATIONS:  Nausea.   Vomiting.   hx of dysautonomia, depression, ? delayed gastric emptying in the past, currently on Ranitidine 300mg  , abd.  sono normal,. MEDICATIONS: MAC sedation, administered by CRNA and propofol (Diprivan) 250mg  IV TOPICAL ANESTHETIC: none  DESCRIPTION OF PROCEDURE: After the risks benefits and alternatives of the procedure were thoroughly explained, informed consent was obtained.  The [facility] GIF-H180 [id number] endoscope was introduced through the mouth and advanced to the second portion of the duodenum. Without limitations.  The instrument was slowly withdrawn as the mucosa was fully examined.        ESOPHAGUS: The mucosa of the esophagus appeared normal. , z-line was slightly irregulat, biopsies were taken to r/o Barrett's esophagus   STOMACH: There was mild antral gastropathy noted.  Cold forcep biopsies were taken at the antrum.to r/o H.Pylori  DUODENUM: The duodenal mucosa showed no abnormalities.  Retroflexed views revealed no abnormalities.     The scope was then withdrawn from the patient and the procedure completed.  COMPLICATIONS: There were no complications. ENDOSCOPIC IMPRESSION: 1.   The mucosa of the esophagus appeared normal , bx's from irregular z-line 2.   There was mild antral gastropathy noted . s/p biopsies 3.   The duodenal mucosa showed no abnormalities  nothing to account for the nausea RECOMMENDATIONS:  continue Ranitidine 300mg  hs GEScan to r/o gastroparesis  REPEAT EXAM: none  eSigned:  Hart Carwin, MD 01/29/2013 4:14 PM   CC:  PATIENT NAME:  Alisha Copeland, Alisha Copeland MR#:  [id number]

## 2013-01-29 NOTE — Progress Notes (Signed)
1557 a/ox3 pleased with MAC report to [employer]

## 2013-01-30 ENCOUNTER — Telehealth: Payer: Self-pay

## 2013-01-30 NOTE — Telephone Encounter (Signed)
Left message on answering machine. 

## 2013-02-04 ENCOUNTER — Encounter: Payer: Self-pay | Admitting: Internal Medicine

## 2013-02-12 ENCOUNTER — Encounter (HOSPITAL_COMMUNITY): Payer: BC Managed Care – PPO

## 2013-03-06 ENCOUNTER — Encounter (HOSPITAL_COMMUNITY): Payer: BC Managed Care – PPO

## 2013-03-14 ENCOUNTER — Telehealth: Payer: Self-pay | Admitting: Internal Medicine

## 2013-03-14 ENCOUNTER — Other Ambulatory Visit: Payer: Self-pay | Admitting: *Deleted

## 2013-03-14 DIAGNOSIS — I10 Essential (primary) hypertension: Secondary | ICD-10-CM

## 2013-03-14 DIAGNOSIS — G90A Postural orthostatic tachycardia syndrome (POTS): Secondary | ICD-10-CM

## 2013-03-14 MED ORDER — AMLODIPINE BESYLATE 5 MG PO TABS: 5.0000 mg | ORAL_TABLET | Freq: Every day | ORAL | Status: DC

## 2013-03-14 NOTE — Telephone Encounter (Signed)
Done

## 2013-03-14 NOTE — Telephone Encounter (Signed)
Patient called   BP has remained high.  Feels jittery Stopped amlodipine  On Coreg 3.125 bid  This was a misunderstanding  I had wanted her to stay on amlodipine.  Recomm amlodipine 5 bid  Keep on coreg 3.125 bid  Call in Rx to [facility] on [address].

## 2013-04-10 ENCOUNTER — Encounter (HOSPITAL_COMMUNITY): Payer: Self-pay | Admitting: Emergency Medicine

## 2013-04-10 ENCOUNTER — Emergency Department (HOSPITAL_COMMUNITY)
Admission: EM | Admit: 2013-04-10 | Discharge: 2013-04-10 | Disposition: A | Discharge status: Home / Self Care | Payer: BC Managed Care – PPO | Source: Home / Self Care | Attending: Emergency Medicine | Admitting: Emergency Medicine

## 2013-04-10 DIAGNOSIS — L0291 Cutaneous abscess, unspecified: Secondary | ICD-10-CM

## 2013-04-10 DIAGNOSIS — L0390 Cellulitis, unspecified: Secondary | ICD-10-CM

## 2013-04-10 MED ORDER — DOXYCYCLINE HYCLATE 100 MG PO TABS: 100.0000 mg | ORAL_TABLET | Freq: Two times a day (BID) | ORAL | Status: DC

## 2013-04-10 MED ORDER — MUPIROCIN 2 % EX OINT: TOPICAL_OINTMENT | CUTANEOUS | Status: DC

## 2013-04-10 NOTE — ED Provider Notes (Signed)
Chief Complaint:  No chief complaint on file.   History of Present Illness:    Alisha Copeland is a 27 year old female who has had a one-week history of a painful abscess on her left forearm. This [place] was [place] the beach and it has been draining some pus. She denies any fever or chills. She did have a history of MRSA about a year ago which was treated with an antibiotic. She's not had recurrence since that.  Review of Systems:  Other than noted above, the patient denies any of the following symptoms: Systemic:  No fever, chills or sweats. Skin:  No rash or itching.  PMFSH:  Past medical history, family history, social history, meds, and allergies were reviewed.  No history of diabetes. She has a number of chronic medical problems including postural orthostatic tachycardia syndrome, hypoparathyroidism, and asthma. She takes numerous medications including albuterol, alprazolam, Norvasc, Qvar, Calcitrol, calcium carbonate, carvedilol, birth control pills, Zofran, Effexor, Flonase, and Zantac.  Physical Exam:   Vital signs:  BP 143/101  Pulse 85  Temp(Src) 98.3 F (36.8 C) (Oral)  Resp 16  SpO2 100% Skin:  There was a raised, 3 cm abscess on the dorsum of the left arm with scaling of the skin, discoloration, and a central sinus tract that was draining pus.  Skin exam was otherwise normal.  No rash. Ext:  Distal pulses were full, patient has full ROM of all joints.  Procedure:  Verbal informed consent was obtained.  The patient was informed of the risks and benefits of the procedure and understands and accepts.  Identity of the patient was verified verbally and by wristband. Using firm pressure, a moderate amount of pus was drained out of the lesion and this was then cultured. A sterile dressing was applied.  Assessment:  The encounter diagnosis was Abscess.  This is almost certainly recurrence of MRSA. The culture is pending. In the meantime has started on doxycycline at and given  instructions for decontamination for herself and her boyfriend. I suggested that she apply mupirocin to her nostrils twice daily for a month and he do the same. I also suggested that she take Clorox baths twice weekly for 3 months.  Plan:   1.  The following meds were prescribed:   Discharge Medication List as of 04/10/2013  6:43 PM    START taking these medications   Details  doxycycline (VIBRA-TABS) 100 MG tablet Take 1 tablet (100 mg total) by mouth 2 (two) times daily., Starting 04/10/2013, Until Discontinued, Normal    mupirocin ointment (BACTROBAN) 2 % Apply to both nostrils BID for 1 month., Normal       2.  The patient was instructed in symptomatic care and handouts were given. 3.  The patient was instructed in wound care and instructed to return if she should develop any worsening symptoms including fever and extension of the erythema either up or down the arm.   Reuben Likes, MD 04/10/13 [phone]

## 2013-04-13 LAB — CULTURE, ROUTINE-ABSCESS

## 2013-04-14 ENCOUNTER — Telehealth (HOSPITAL_COMMUNITY): Payer: Self-pay | Admitting: *Deleted

## 2013-04-14 NOTE — ED Notes (Addendum)
Abscess culture L arm: Abundant MRSA. Pt. adequately treated with Doxycycline.  I called pt. and left a message to call. Alisha Copeland 04/14/2013 Pt. called back.  Pt. verified x 2 and given results.  Pt. told she was adequately treated with Doxycycline. Pt. states she has had MRSA before. I reviewed [place] MRSA instructions with the pt.  Pt. voiced understanding.

## 2013-04-15 ENCOUNTER — Telehealth (HOSPITAL_COMMUNITY): Payer: Self-pay | Admitting: *Deleted

## 2013-07-27 ENCOUNTER — Encounter (HOSPITAL_COMMUNITY): Payer: Self-pay | Admitting: Emergency Medicine

## 2013-07-27 ENCOUNTER — Emergency Department (HOSPITAL_COMMUNITY)
Admission: EM | Admit: 2013-07-27 | Discharge: 2013-07-27 | Disposition: A | Discharge status: Home / Self Care | Payer: BC Managed Care – PPO | Source: Home / Self Care | Attending: Family Medicine | Admitting: Family Medicine

## 2013-07-27 ENCOUNTER — Other Ambulatory Visit: Payer: Self-pay

## 2013-07-27 DIAGNOSIS — I10 Essential (primary) hypertension: Secondary | ICD-10-CM

## 2013-07-27 DIAGNOSIS — N390 Urinary tract infection, site not specified: Secondary | ICD-10-CM

## 2013-07-27 LAB — POCT URINALYSIS DIP (DEVICE)
Bilirubin Urine: NEGATIVE
Glucose, UA: NEGATIVE mg/dL
Leukocytes, UA: NEGATIVE
Nitrite: POSITIVE — AB
Specific Gravity, Urine: 1.02 (ref 1.005–1.030)

## 2013-07-27 LAB — POCT PREGNANCY, URINE: Preg Test, Ur: NEGATIVE

## 2013-07-27 MED ORDER — ONDANSETRON 4 MG PO TBDP: 4.0000 mg | ORAL_TABLET | Freq: Three times a day (TID) | ORAL | Status: DC | PRN

## 2013-07-27 MED ORDER — CEPHALEXIN 500 MG PO CAPS: 500.0000 mg | ORAL_CAPSULE | Freq: Four times a day (QID) | ORAL | Status: DC

## 2013-07-27 NOTE — ED Provider Notes (Signed)
Medical screening examination/treatment/procedure(s) were performed by resident physician or non-physician practitioner and as supervising physician I was immediately available for consultation/collaboration.   Barkley Bruns MD.   Linna Hoff, MD 07/27/13 [phone]

## 2013-07-27 NOTE — ED Notes (Signed)
Pt c/o frequency with urination, abdominal pain x 1 week. Pt reports she has a hx of UTIs in the past. She is alert and oriented.

## 2013-07-27 NOTE — ED Provider Notes (Signed)
CSN: [id number]     Arrival date & time 07/27/13  1038 History   First MD Initiated Contact with Patient 07/27/13 1244     Chief Complaint  Patient presents with  . Urinary Tract Infection   (Consider location/radiation/quality/duration/timing/severity/associated sxs/prior Treatment) Patient is a 27 y.o. female presenting with urinary tract infection. The history is provided by the patient.  Urinary Tract Infection This is a new problem. Episode onset: 1 week. The problem occurs constantly. The problem has been gradually worsening. Associated symptoms include abdominal pain. Nothing aggravates the symptoms. Nothing relieves the symptoms. She has tried nothing for the symptoms.    Past Medical History  Diagnosis Date  . POTS (postural orthostatic tachycardia syndrome)   . Hypoparathyroidism   . GERD (gastroesophageal reflux disease)   . Asthma   . Hypertension   . Chronic kidney disease     UTI's   History reviewed. No pertinent past surgical history. Family History  Problem Relation Age of Onset  . Hypertension Father   . Hypertension Paternal Grandfather   . Lung cancer Maternal Grandfather   . Diabetes Maternal Grandmother   . Crohn's disease Maternal Grandmother   . Irritable bowel syndrome Mother   . Colon cancer Neg Hx   . Rectal cancer Neg Hx   . Stomach cancer Neg Hx    History  Substance Use Topics  . Smoking status: Never Smoker   . Smokeless tobacco: Never Used  . Alcohol Use: Yes     Comment: occassionaly   OB History   Grav Para Term Preterm Abortions TAB SAB Ect Mult Living                 Review of Systems  Constitutional: Positive for chills. Negative for fever.  Gastrointestinal: Positive for nausea and abdominal pain. Negative for vomiting, diarrhea and constipation.  Genitourinary: Positive for dysuria. Negative for flank pain.    Allergies  Beta adrenergic blockers; Ciprofloxacin; Drospirenone-ethinyl estradiol; Methylphenidate hcl;  Nitrofurantoin; Sertraline hcl; Sulfamethoxazole-trimethoprim; and Vancomycin  Home Medications   Current Outpatient Rx  Name  Route  Sig  Dispense  Refill  . albuterol (PROVENTIL HFA;VENTOLIN HFA) 108 (90 BASE) MCG/ACT inhaler   Inhalation   Inhale 2 puffs into the lungs every 6 (six) hours as needed for wheezing.   1 Inhaler   1   . ALPRAZolam (XANAX) 0.5 MG tablet   Oral   Take 0.5 mg by mouth at bedtime as needed.         Marland Kitchen amLODipine (NORVASC) 5 MG tablet   Oral   Take 1 tablet (5 mg total) by mouth daily.   90 tablet   3   . beclomethasone (QVAR) 40 MCG/ACT inhaler   Inhalation   Inhale 2 puffs into the lungs 2 (two) times daily.   1 Inhaler   12   . calcitRIOL (ROCALTROL) 0.25 MCG capsule   Oral   Take 0.25 mcg by mouth daily. Pt takes tid         . calcium carbonate (OS-CAL) 600 MG TABS   Oral   Take 600 mg by mouth 2 (two) times daily with a meal.         . carvedilol (COREG) 3.125 MG tablet   Oral   Take 1 tablet (3.125 mg total) by mouth 2 (two) times daily with a meal.   180 tablet   3   . cephALEXin (KEFLEX) 500 MG capsule   Oral   Take 1 capsule (500 mg  total) by mouth 4 (four) times daily.   28 capsule   0   . doxycycline (VIBRA-TABS) 100 MG tablet   Oral   Take 1 tablet (100 mg total) by mouth 2 (two) times daily.   20 tablet   0   . EXPIRED: fluticasone (FLONASE) 50 MCG/ACT nasal spray   Nasal   Place 2 sprays into the nose daily.   16 g   0   . mupirocin ointment (BACTROBAN) 2 %      Apply to both nostrils BID for 1 month.   22 g   3   . Norgestimate-Eth Estradiol (MONONESSA PO)   Oral   Take by mouth.         . ondansetron (ZOFRAN ODT) 4 MG disintegrating tablet   Oral   Take 1 tablet (4 mg total) by mouth every 8 (eight) hours as needed for nausea.   20 tablet   0   . ondansetron (ZOFRAN) 4 MG tablet   Oral   Take 1 tablet (4 mg total) by mouth every 8 (eight) hours as needed for nausea.   30 tablet   0   .  ranitidine (ZANTAC) 300 MG tablet   Oral   Take 1 tablet (300 mg total) by mouth at bedtime.   30 tablet   2   . venlafaxine XR (EFFEXOR-XR) 37.5 MG 24 hr capsule   Oral   Take 75 mg by mouth daily.          BP 193/135  Pulse 120  Temp(Src) 98.7 F (37.1 C) (Oral)  SpO2 100%  LMP 07/16/2013 Physical Exam  Constitutional: She appears well-developed and well-nourished. No distress.  Cardiovascular: Normal rate and regular rhythm.   Pulmonary/Chest: Effort normal and breath sounds normal.  Abdominal: Soft. Bowel sounds are normal. There is no tenderness. There is no CVA tenderness.    ED Course  Procedures (including critical care time) Labs Review Labs Reviewed  POCT URINALYSIS DIP (DEVICE) - Abnormal; Notable for the following:    Ketones, ur 15 (*)    Hgb urine dipstick TRACE (*)    Protein, ur 30 (*)    Nitrite POSITIVE (*)    All other components within normal limits  POCT PREGNANCY, URINE   Imaging Review No results found.  EKG Interpretation     Ventricular Rate:    PR Interval:    QRS Duration:   QT Interval:    QTC Calculation:   R Axis:     Text Interpretation:             Pt pulse initially 120 upon arriving to room.  EKG performed, shows NSR 90bpm with short pr, otherwise normal. HR normal on exam.  MDM   1. UTI (lower urinary tract infection)   2. Hypertension    Rx keflex 500mg  QID for 7 days #28 and zofran ODT 4mg  q8 hrs prn nausea #20. Pt to f/u with cardiologist about high bp.     Cathlyn Parsons, NP 07/27/13 1250

## 2013-08-21 ENCOUNTER — Other Ambulatory Visit: Payer: Self-pay

## 2013-12-02 IMAGING — US US ABDOMEN COMPLETE
1 series · 14 of 25 positions shown · non-contrast
Comparison: None.

CLINICAL DATA: nausea

ABDOMINAL ULTRASOUND COMPLETE

[Series 1: us abdomen complete · 0.28mm/px · 14 of 80 slices shown]
[im 1/80]
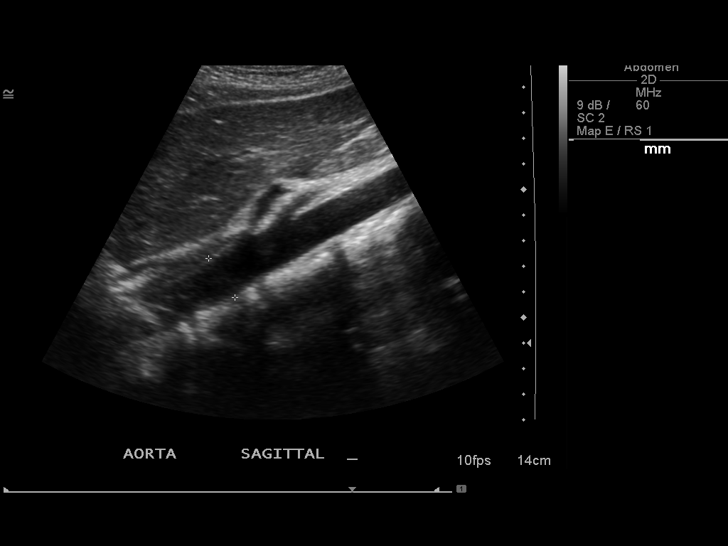
[im 7/80]
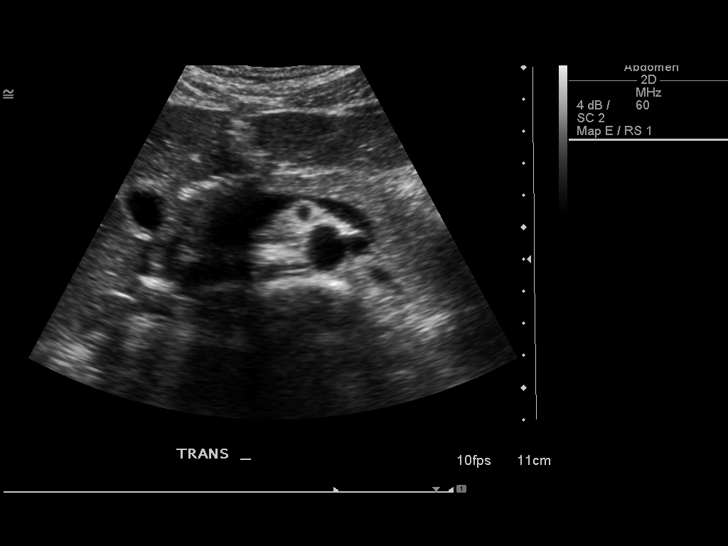
[im 14/80]
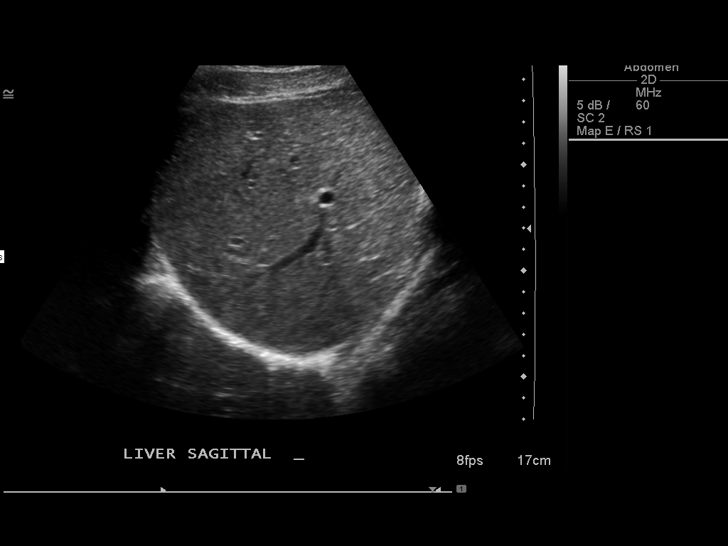
[im 20/80]
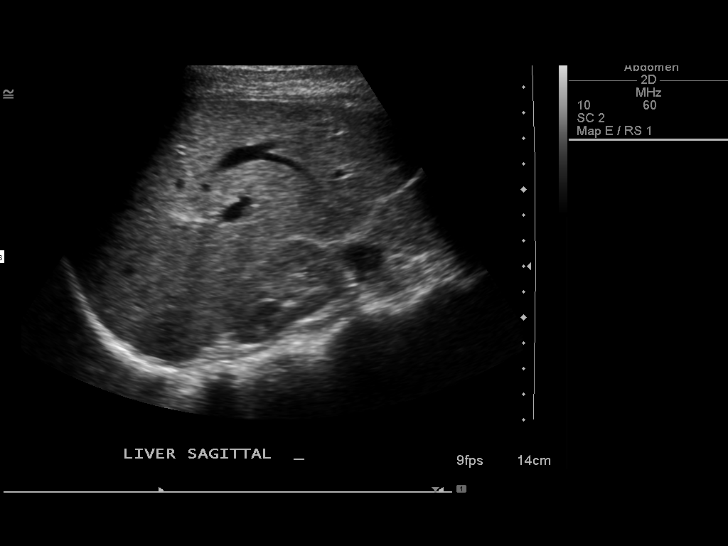
[im 27/80]
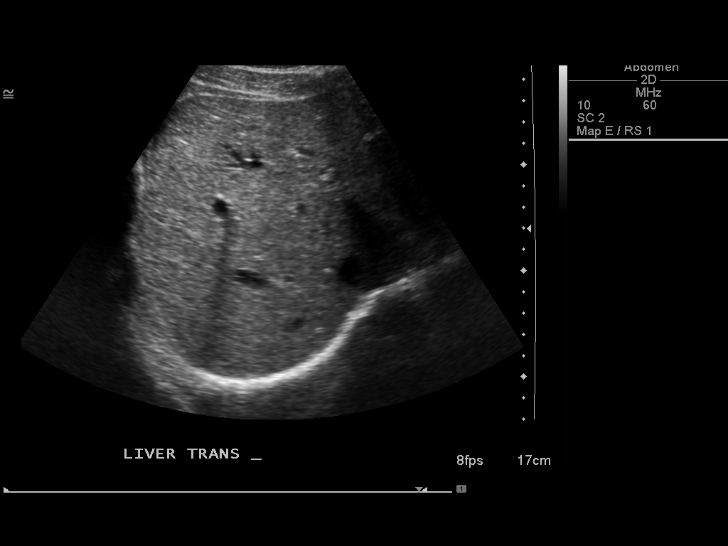
[im 30/80]
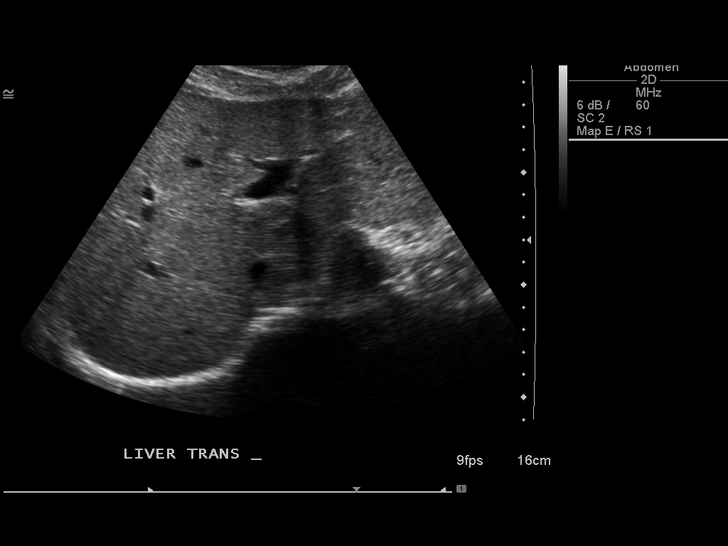
[im 37/80]
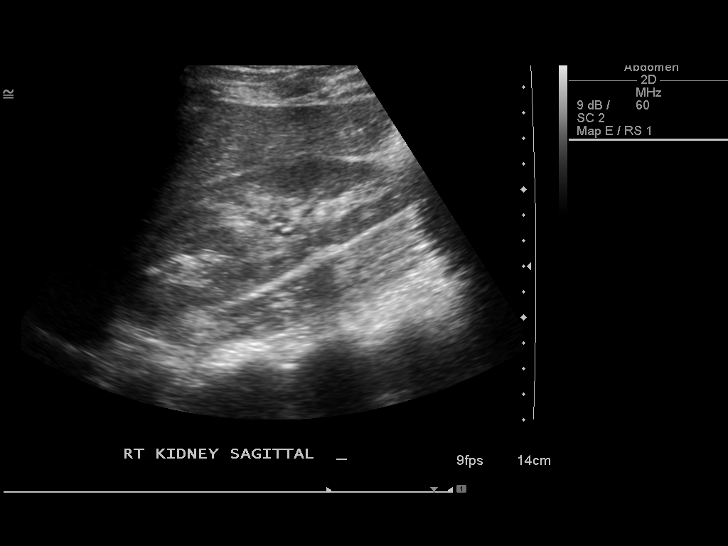
[im 43/80]
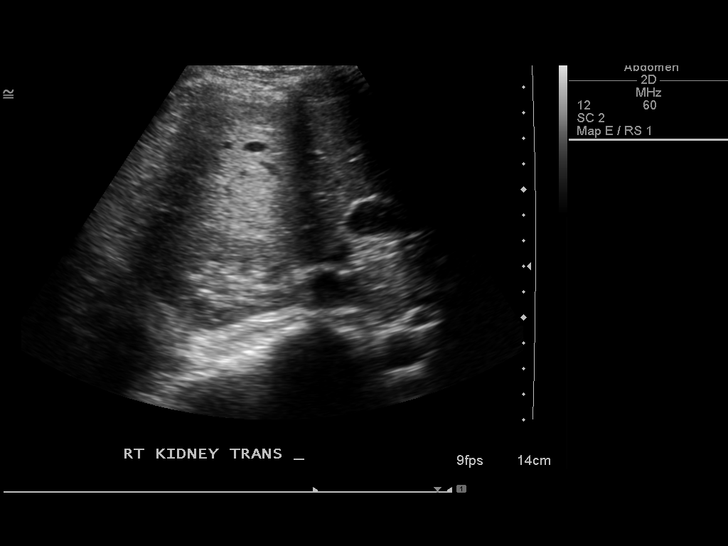
[im 50/80]
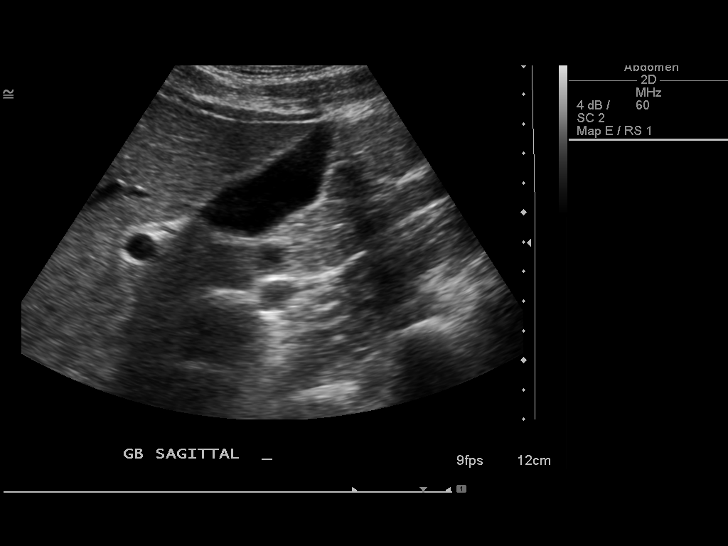
[im 53/80]
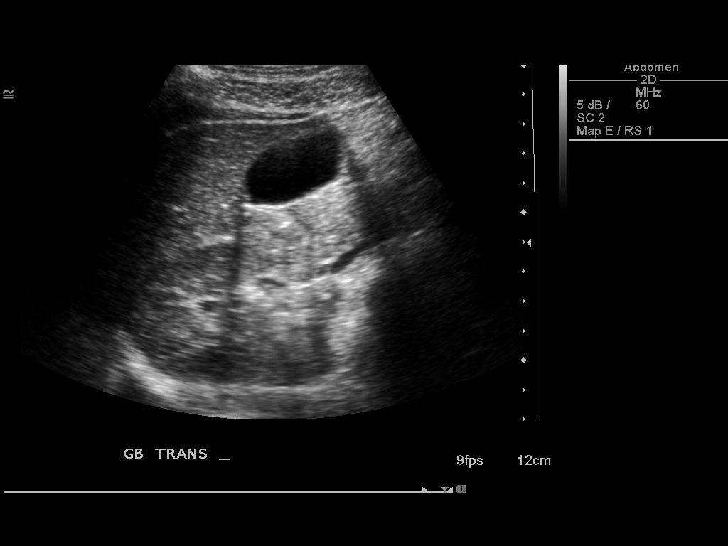
[im 60/80]
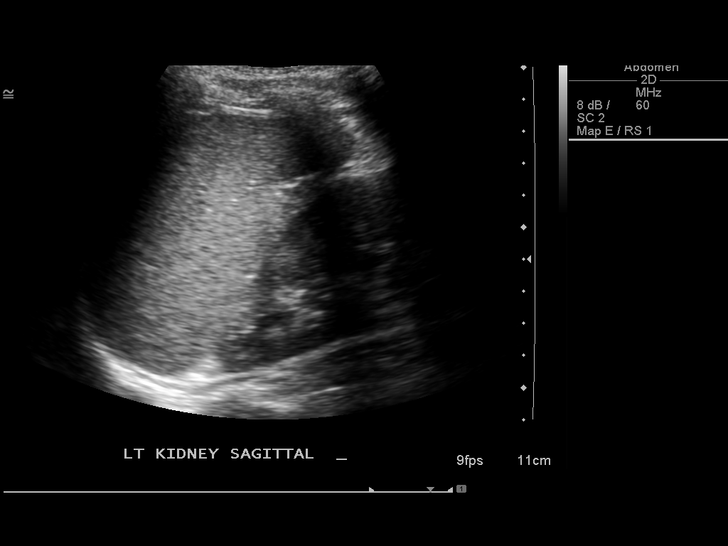
[im 66/80]
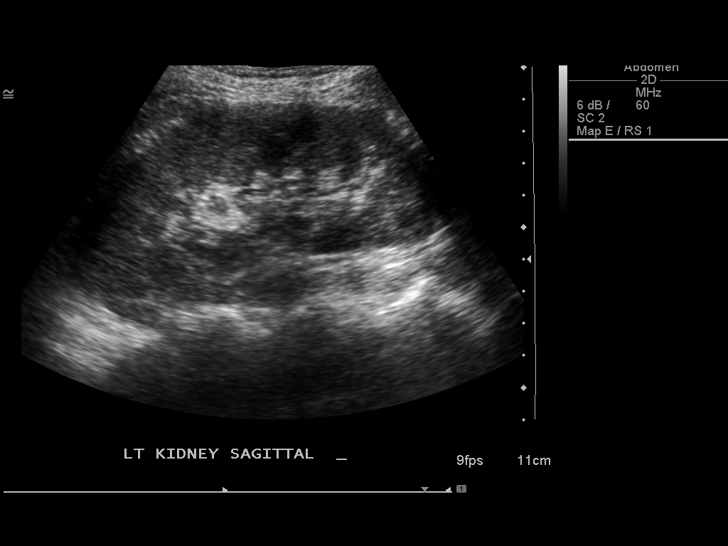
[im 73/80]
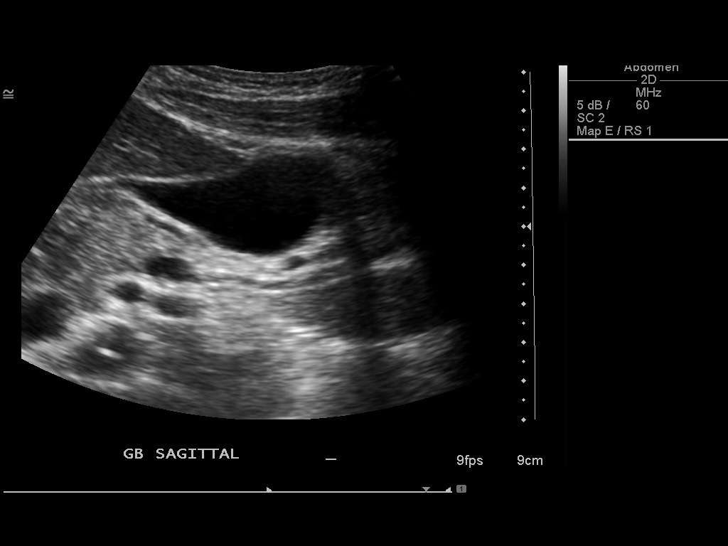
[im 80/80]
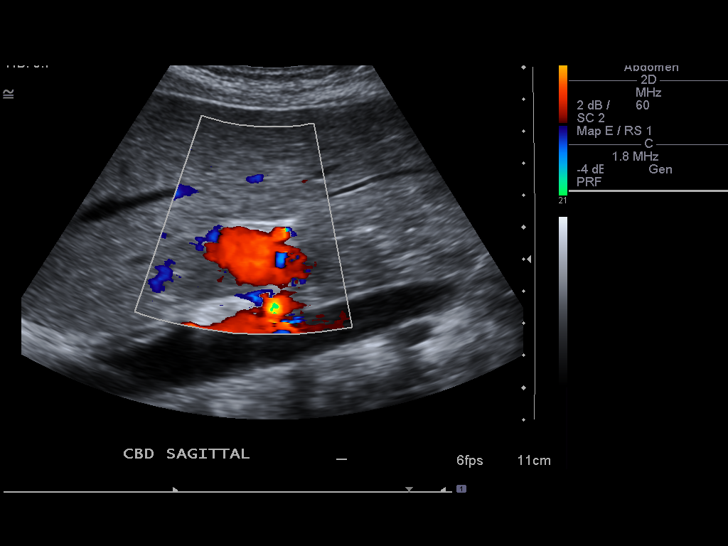

[14 of 25 positions shown; findings below may reference images not displayed]

FINDINGS: Gallbladder:  No gallstones, gallbladder wall thickening, or
pericholecystic fluid.

Common Bile Duct:  Within normal limits in caliber.

Liver: No focal mass lesion identified.  Within normal limits in
parenchymal echogenicity.

IVC:  Appears normal.

Pancreas:  No abnormality identified.

Spleen:  Within normal limits in size and echotexture.

Right kidney:  Normal in size and parenchymal echogenicity.  No
evidence of mass or hydronephrosis.

Left kidney:  Normal in size and parenchymal echogenicity.  No
evidence of mass or hydronephrosis.

Abdominal Aorta:  No aneurysm identified.
IMPRESSION: Negative abdominal ultrasound.

## 2013-12-17 ENCOUNTER — Encounter (HOSPITAL_COMMUNITY): Payer: Self-pay | Admitting: Emergency Medicine

## 2013-12-17 ENCOUNTER — Emergency Department (HOSPITAL_COMMUNITY)
Admission: EM | Admit: 2013-12-17 | Discharge: 2013-12-17 | Disposition: A | Discharge status: Home / Self Care | Payer: BC Managed Care – PPO | Source: Home / Self Care | Attending: Family Medicine | Admitting: Family Medicine

## 2013-12-17 DIAGNOSIS — Q078 Other specified congenital malformations of nervous system: Secondary | ICD-10-CM

## 2013-12-17 DIAGNOSIS — N390 Urinary tract infection, site not specified: Secondary | ICD-10-CM

## 2013-12-17 LAB — POCT URINALYSIS DIP (DEVICE)
Bilirubin Urine: NEGATIVE
Glucose, UA: NEGATIVE mg/dL
Hgb urine dipstick: NEGATIVE
Ketones, ur: NEGATIVE mg/dL
Leukocytes, UA: NEGATIVE
Nitrite: POSITIVE — AB
Protein, ur: NEGATIVE mg/dL
Specific Gravity, Urine: >=1.03 (ref 1.005–1.030)
Urobilinogen, UA: 0.2 mg/dL (ref 0.0–1.0)
pH: 6 (ref 5.0–8.0)

## 2013-12-17 LAB — POCT PREGNANCY, URINE: PREG TEST UR: NEGATIVE

## 2013-12-17 MED ORDER — CEPHALEXIN 500 MG PO CAPS: 500.0000 mg | ORAL_CAPSULE | Freq: Four times a day (QID) | ORAL | Status: DC

## 2013-12-17 NOTE — ED Provider Notes (Signed)
Alisha Copeland is a 28 y.o. female who presents to Urgent Care today for UTI. Patient has 3 days of foul-smelling urine abdominal and back pain associated with dysuria frequency and urgency. Symptoms are consistent with prior UTI. No fevers or chills nausea vomiting or diarrhea. Patient has tried Advil and cramp or a pills which have helped only a little.   Past Medical History  Diagnosis Date  . POTS (postural orthostatic tachycardia syndrome)   . Hypoparathyroidism   . GERD (gastroesophageal reflux disease)   . Asthma   . Hypertension   . Chronic kidney disease     UTI's   History  Substance Use Topics  . Smoking status: Never Smoker   . Smokeless tobacco: Never Used  . Alcohol Use: Yes     Comment: occassionaly   ROS as above Medications: No current facility-administered medications for this encounter.   Current Outpatient Prescriptions  Medication Sig Dispense Refill  . albuterol (PROVENTIL HFA;VENTOLIN HFA) 108 (90 BASE) MCG/ACT inhaler Inhale 2 puffs into the lungs every 6 (six) hours as needed for wheezing.  1 Inhaler  1  . ALPRAZolam (XANAX) 0.5 MG tablet Take 0.5 mg by mouth at bedtime as needed.      Marland Kitchen amLODipine (NORVASC) 5 MG tablet Take 1 tablet (5 mg total) by mouth daily.  90 tablet  3  . beclomethasone (QVAR) 40 MCG/ACT inhaler Inhale 2 puffs into the lungs 2 (two) times daily.  1 Inhaler  12  . calcitRIOL (ROCALTROL) 0.25 MCG capsule Take 0.25 mcg by mouth daily. Pt takes tid      . calcium carbonate (OS-CAL) 600 MG TABS Take 600 mg by mouth 2 (two) times daily with a meal.      . carvedilol (COREG) 3.125 MG tablet Take 1 tablet (3.125 mg total) by mouth 2 (two) times daily with a meal.  180 tablet  3  . cephALEXin (KEFLEX) 500 MG capsule Take 1 capsule (500 mg total) by mouth 4 (four) times daily.  28 capsule  0  . fluticasone (FLONASE) 50 MCG/ACT nasal spray Place 2 sprays into the nose daily.  16 g  0  . mupirocin ointment (BACTROBAN) 2 % Apply to both  nostrils BID for 1 month.  22 g  3  . Norgestimate-Eth Estradiol (MONONESSA PO) Take by mouth.      . ondansetron (ZOFRAN ODT) 4 MG disintegrating tablet Take 1 tablet (4 mg total) by mouth every 8 (eight) hours as needed for nausea.  20 tablet  0  . ondansetron (ZOFRAN) 4 MG tablet Take 1 tablet (4 mg total) by mouth every 8 (eight) hours as needed for nausea.  30 tablet  0  . ranitidine (ZANTAC) 300 MG tablet Take 1 tablet (300 mg total) by mouth at bedtime.  30 tablet  2  . venlafaxine XR (EFFEXOR-XR) 37.5 MG 24 hr capsule Take 75 mg by mouth daily.        Exam:  BP 167/110  Pulse 63  Temp(Src) 98.3 F (36.8 C) (Oral)  Resp 17  SpO2 100%  LMP 12/06/2013 Gen: Well NAD HEENT: EOMI,  MMM Lungs: Normal work of breathing. CTABL Heart: RRR no MRG Abd: NABS, Soft. NT, ND no CV angle tenderness to percussion Exts: Brisk capillary refill, warm and well perfused.   Results for orders placed during the hospital encounter of 12/17/13 (from the past 24 hour(s))  POCT URINALYSIS DIP (DEVICE)     Status: Abnormal   Collection Time  12/17/13  5:56 PM      Result Value Ref Range   Glucose, UA NEGATIVE  NEGATIVE mg/dL   Bilirubin Urine NEGATIVE  NEGATIVE   Ketones, ur NEGATIVE  NEGATIVE mg/dL   Specific Gravity, Urine >=1.030  1.005 - 1.030   Hgb urine dipstick NEGATIVE  NEGATIVE   pH 6.0  5.0 - 8.0   Protein, ur NEGATIVE  NEGATIVE mg/dL   Urobilinogen, UA 0.2  0.0 - 1.0 mg/dL   Nitrite POSITIVE (*) NEGATIVE   Leukocytes, UA NEGATIVE  NEGATIVE  POCT PREGNANCY, URINE     Status: None   Collection Time    12/17/13  6:00 PM      Result Value Ref Range   Preg Test, Ur NEGATIVE  NEGATIVE   No results found.  Assessment and Plan: 28 y.o. female with urinary tract infection. Culture pending. Treat empirically with Keflex as is tolerated by the patient. Followup as needed.  Discussed warning signs or symptoms. Please see discharge instructions. Patient expresses understanding.    Gregor Hams, MD 12/17/13 [phone]

## 2013-12-17 NOTE — Discharge Instructions (Signed)
Thank you for coming in today. Take Keflex for 7 days If your belly pain worsens, or you have high fever, bad vomiting, blood in your stool or black tarry stool go to the Emergency Room.   Urinary Tract Infection Urinary tract infections (UTIs) can develop anywhere along your urinary tract. Your urinary tract is your body's drainage system for removing wastes and extra water. Your urinary tract includes two kidneys, two ureters, a bladder, and a urethra. Your kidneys are a pair of bean-shaped organs. Each kidney is about the size of your fist. They are located below your ribs, one on each side of your spine. CAUSES Infections are caused by microbes, which are microscopic organisms, including fungi, viruses, and bacteria. These organisms are so small that they can only be seen through a microscope. Bacteria are the microbes that most commonly cause UTIs. SYMPTOMS  Symptoms of UTIs may vary by age and gender of the patient and by the location of the infection. Symptoms in young women typically include a frequent and intense urge to urinate and a painful, burning feeling in the bladder or urethra during urination. Older women and men are more likely to be tired, shaky, and weak and have muscle aches and abdominal pain. A fever may mean the infection is in your kidneys. Other symptoms of a kidney infection include pain in your back or sides below the ribs, nausea, and vomiting. DIAGNOSIS To diagnose a UTI, your caregiver will ask you about your symptoms. Your caregiver also will ask to provide a urine sample. The urine sample will be tested for bacteria and white blood cells. White blood cells are made by your body to help fight infection. TREATMENT  Typically, UTIs can be treated with medication. Because most UTIs are caused by a bacterial infection, they usually can be treated with the use of antibiotics. The choice of antibiotic and length of treatment depend on your symptoms and the type of bacteria  causing your infection. HOME CARE INSTRUCTIONS  If you were prescribed antibiotics, take them exactly as your caregiver instructs you. Finish the medication even if you feel better after you have only taken some of the medication.  Drink enough water and fluids to keep your urine clear or pale yellow.  Avoid caffeine, tea, and carbonated beverages. They tend to irritate your bladder.  Empty your bladder often. Avoid holding urine for long periods of time.  Empty your bladder before and after sexual intercourse.  After a bowel movement, women should cleanse from front to back. Use each tissue only once. SEEK MEDICAL CARE IF:   You have back pain.  You develop a fever.  Your symptoms do not begin to resolve within 3 days. SEEK IMMEDIATE MEDICAL CARE IF:   You have severe back pain or lower abdominal pain.  You develop chills.  You have nausea or vomiting.  You have continued burning or discomfort with urination. MAKE SURE YOU:   Understand these instructions.  Will watch your condition.  Will get help right away if you are not doing well or get worse. Document Released: 07/12/2005 Document Revised: 04/02/2012 Document Reviewed: 11/10/2011 [employer] Patient Information 2014 [place].

## 2013-12-17 NOTE — ED Notes (Signed)
Reports back pain, nausea, odor to urine, urinary frequency, burning with urination, and noted scant amount of blood in urine this am.  Patient has had many uti's.

## 2013-12-19 LAB — URINE CULTURE
Colony Count: >=100000
SPECIAL REQUESTS: NORMAL

## 2013-12-19 NOTE — ED Notes (Signed)
Urine culture: >100,000 colonies E. Coli.  Pt. adequately treated with Keflex. Roselyn Meier 12/19/2013

## 2014-07-09 ENCOUNTER — Encounter (HOSPITAL_BASED_OUTPATIENT_CLINIC_OR_DEPARTMENT_OTHER): Payer: Self-pay | Admitting: Emergency Medicine

## 2014-07-09 ENCOUNTER — Emergency Department (HOSPITAL_BASED_OUTPATIENT_CLINIC_OR_DEPARTMENT_OTHER)
Admission: EM | Admit: 2014-07-09 | Discharge: 2014-07-09 | Disposition: A | Discharge status: Home / Self Care | Payer: BC Managed Care – PPO | Attending: Emergency Medicine | Admitting: Emergency Medicine

## 2014-07-09 DIAGNOSIS — E209 Hypoparathyroidism, unspecified: Secondary | ICD-10-CM | POA: Insufficient documentation

## 2014-07-09 DIAGNOSIS — R300 Dysuria: Secondary | ICD-10-CM | POA: Diagnosis not present

## 2014-07-09 DIAGNOSIS — Z792 Long term (current) use of antibiotics: Secondary | ICD-10-CM | POA: Insufficient documentation

## 2014-07-09 DIAGNOSIS — J45909 Unspecified asthma, uncomplicated: Secondary | ICD-10-CM | POA: Insufficient documentation

## 2014-07-09 DIAGNOSIS — K219 Gastro-esophageal reflux disease without esophagitis: Secondary | ICD-10-CM | POA: Diagnosis not present

## 2014-07-09 DIAGNOSIS — I498 Other specified cardiac arrhythmias: Secondary | ICD-10-CM | POA: Diagnosis not present

## 2014-07-09 DIAGNOSIS — N898 Other specified noninflammatory disorders of vagina: Secondary | ICD-10-CM | POA: Insufficient documentation

## 2014-07-09 DIAGNOSIS — IMO0002 Reserved for concepts with insufficient information to code with codable children: Secondary | ICD-10-CM | POA: Insufficient documentation

## 2014-07-09 DIAGNOSIS — Z79899 Other long term (current) drug therapy: Secondary | ICD-10-CM | POA: Diagnosis not present

## 2014-07-09 DIAGNOSIS — I129 Hypertensive chronic kidney disease with stage 1 through stage 4 chronic kidney disease, or unspecified chronic kidney disease: Secondary | ICD-10-CM | POA: Diagnosis not present

## 2014-07-09 DIAGNOSIS — N189 Chronic kidney disease, unspecified: Secondary | ICD-10-CM | POA: Insufficient documentation

## 2014-07-09 LAB — URINALYSIS, ROUTINE W REFLEX MICROSCOPIC
BILIRUBIN URINE: NEGATIVE
Glucose, UA: NEGATIVE mg/dL
Hgb urine dipstick: NEGATIVE
Ketones, ur: NEGATIVE mg/dL
Leukocytes, UA: NEGATIVE
NITRITE: NEGATIVE
PROTEIN: NEGATIVE mg/dL
Specific Gravity, Urine: 1.022 (ref 1.005–1.030)
UROBILINOGEN UA: 0.2 mg/dL (ref 0.0–1.0)
pH: 6.5 (ref 5.0–8.0)

## 2014-07-09 LAB — PREGNANCY, URINE: Preg Test, Ur: NEGATIVE

## 2014-07-09 MED ORDER — PHENAZOPYRIDINE HCL 200 MG PO TABS: 200.0000 mg | ORAL_TABLET | Freq: Three times a day (TID) | ORAL | Status: DC | PRN

## 2014-07-09 NOTE — Discharge Instructions (Signed)
Dysuria No evidence of a urinary tract infection from your urine test tonight. Please call your gynecologist to schedule an office visit. Your blood pressure should be rechecked within a week. Tonight's is elevated at 173/117. Dysuria is the medical term for pain with urination. There are many causes for dysuria, but urinary tract infection is the most common. If a urinalysis was performed it can show that there is a urinary tract infection. A urine culture confirms that you or your child is sick. You will need to follow up with a healthcare provider because:  If a urine culture was done you will need to know the culture results and treatment recommendations.  If the urine culture was positive, you or your child will need to be put on antibiotics or know if the antibiotics prescribed are the right antibiotics for your urinary tract infection.  If the urine culture is negative (no urinary tract infection), then other causes may need to be explored or antibiotics need to be stopped. Today laboratory work may have been done and there does not seem to be an infection. If cultures were done they will take at least 24 to 48 hours to be completed. Today x-rays may have been taken and they read as normal. No cause can be found for the problems. The x-rays may be re-read by a radiologist and you will be contacted if additional findings are made. You or your child may have been put on medications to help with this problem until you can see your primary caregiver. If the problems get better, see your primary caregiver if the problems return. If you were given antibiotics (medications which kill germs), take all of the mediations as directed for the full course of treatment.  If laboratory work was done, you need to find the results. Leave a telephone number where you can be reached. If this is not possible, make sure you find out how you are to get test results. HOME CARE INSTRUCTIONS   Drink lots of fluids. For  adults, drink eight, 8 ounce glasses of clear juice or water a day. For children, replace fluids as suggested by your caregiver.  Empty the bladder often. Avoid holding urine for long periods of time.  After a bowel movement, women should cleanse front to back, using each tissue only once.  Empty your bladder before and after sexual intercourse.  Take all the medicine given to you until it is gone. You may feel better in a few days, but TAKE ALL MEDICINE.  Avoid caffeine, tea, alcohol and carbonated beverages, because they tend to irritate the bladder.  In men, alcohol may irritate the prostate.  Only take over-the-counter or prescription medicines for pain, discomfort, or fever as directed by your caregiver.  If your caregiver has given you a follow-up appointment, it is very important to keep that appointment. Not keeping the appointment could result in a chronic or permanent injury, pain, and disability. If there is any problem keeping the appointment, you must call back to this facility for assistance. SEEK IMMEDIATE MEDICAL CARE IF:   Back pain develops.  A fever develops.  There is nausea (feeling sick to your stomach) or vomiting (throwing up).  Problems are no better with medications or are getting worse. MAKE SURE YOU:   Understand these instructions.  Will watch your condition.  Will get help right away if you are not doing well or get worse. Document Released: 06/30/2004 Document Revised: 12/25/2011 Document Reviewed: 05/07/2008 [employer] Patient Information 2015  [employer]. This information is not intended to replace advice given to you by your health care provider. Make sure you discuss any questions you have with your health care provider.

## 2014-07-09 NOTE — ED Provider Notes (Signed)
CSN: [id number]     Arrival date & time 07/09/14  1932 History   First MD Initiated Contact with Patient 07/09/14 2248     Chief Complaint  Patient presents with  . Urinary Tract Infection     (Consider location/radiation/quality/duration/timing/severity/associated sxs/prior Treatment) HPI Complains of dysuria and burning at urethral meatus onset 4 days ago. Feels like urinary tract infection she's had in the past. Symptoms worse with urination not improve with anything. Associated symptoms include slight vaginal discharge. Last normal menstrual period approximately 2 weeks ago. No fever no back pain. No treatment prior to coming here Past Medical History  Diagnosis Date  . POTS (postural orthostatic tachycardia syndrome)   . Hypoparathyroidism   . GERD (gastroesophageal reflux disease)   . Asthma   . Hypertension   . Chronic kidney disease     UTI's   History reviewed. No pertinent past surgical history. Family History  Problem Relation Age of Onset  . Hypertension Father   . Hypertension Paternal Grandfather   . Lung cancer Maternal Grandfather   . Diabetes Maternal Grandmother   . Crohn's disease Maternal Grandmother   . Irritable bowel syndrome Mother   . Colon cancer Neg Hx   . Rectal cancer Neg Hx   . Stomach cancer Neg Hx    History  Substance Use Topics  . Smoking status: Never Smoker   . Smokeless tobacco: Never Used  . Alcohol Use: Yes     Comment: occassionaly   OB History   Grav Para Term Preterm Abortions TAB SAB Ect Mult Living                 Review of Systems  Constitutional: Negative.   HENT: Negative.   Respiratory: Negative.   Cardiovascular: Negative.   Gastrointestinal: Negative.   Genitourinary: Positive for dysuria and vaginal discharge.  Musculoskeletal: Negative.   Skin: Negative.   Neurological: Negative.   Psychiatric/Behavioral: Negative.   All other systems reviewed and are negative.     Allergies  Beta adrenergic blockers;  Ciprofloxacin; Drospirenone-ethinyl estradiol; Methylphenidate hcl; Nitrofurantoin; Sertraline hcl; Sulfamethoxazole-trimethoprim; and Vancomycin  Home Medications   Prior to Admission medications   Medication Sig Start Date End Date Taking? Authorizing Provider  cloNIDine (CATAPRES) 0.1 MG tablet Take 0.1 mg by mouth 2 (two) times daily.   Yes Historical Provider, MD  albuterol (PROVENTIL HFA;VENTOLIN HFA) 108 (90 BASE) MCG/ACT inhaler Inhale 2 puffs into the lungs every 6 (six) hours as needed for wheezing. 08/12/12   Janne Napoleon, NP  ALPRAZolam Duanne Moron) 0.5 MG tablet Take 0.5 mg by mouth at bedtime as needed.    Historical Provider, MD  amLODipine (NORVASC) 5 MG tablet Take 1 tablet (5 mg total) by mouth daily. 03/14/13   Fay Records, MD  beclomethasone (QVAR) 40 MCG/ACT inhaler Inhale 2 puffs into the lungs 2 (two) times daily. 08/12/12   Janne Napoleon, NP  calcitRIOL (ROCALTROL) 0.25 MCG capsule Take 0.25 mcg by mouth daily. Pt takes tid    Historical Provider, MD  calcium carbonate (OS-CAL) 600 MG TABS Take 600 mg by mouth 2 (two) times daily with a meal.    Historical Provider, MD  carvedilol (COREG) 3.125 MG tablet Take 1 tablet (3.125 mg total) by mouth 2 (two) times daily with a meal. 01/24/13   Fay Records, MD  cephALEXin (KEFLEX) 500 MG capsule Take 1 capsule (500 mg total) by mouth 4 (four) times daily. 12/17/13   Gregor Hams, MD  fluticasone (FLONASE) 50  MCG/ACT nasal spray Place 2 sprays into the nose daily. 02/05/12 02/04/13  Adlih Moreno-Coll, MD  mupirocin ointment (BACTROBAN) 2 % Apply to both nostrils BID for 1 month. 04/10/13   Harden Mo, MD  Norgestimate-Eth Estradiol (MONONESSA PO) Take by mouth.    Historical Provider, MD  ondansetron (ZOFRAN ODT) 4 MG disintegrating tablet Take 1 tablet (4 mg total) by mouth every 8 (eight) hours as needed for nausea. 07/27/13   Carvel Getting, NP  ondansetron (ZOFRAN) 4 MG tablet Take 1 tablet (4 mg total) by mouth every 8 (eight) hours as  needed for nausea. 12/04/12   Lafayette Dragon, MD  ranitidine (ZANTAC) 300 MG tablet Take 1 tablet (300 mg total) by mouth at bedtime. 12/04/12   Lafayette Dragon, MD  venlafaxine XR (EFFEXOR-XR) 37.5 MG 24 hr capsule Take 75 mg by mouth daily.    Historical Provider, MD   BP 145/99  Pulse 83  Temp(Src) 98 F (36.7 C) (Oral)  Resp 18  Ht 5\' 7"  (1.702 m)  Wt 130 lb (58.968 kg)  BMI 20.36 kg/m2  SpO2 99%  LMP 06/15/2014 Physical Exam  Nursing note and vitals reviewed. Constitutional: She appears well-developed and well-nourished.  HENT:  Head: Normocephalic and atraumatic.  Eyes: Conjunctivae are normal. Pupils are equal, round, and reactive to light.  Neck: Neck supple. No tracheal deviation present. No thyromegaly present.  Cardiovascular: Normal rate and regular rhythm.   No murmur heard. Pulmonary/Chest: Effort normal and breath sounds normal.  Abdominal: Soft. Bowel sounds are normal. She exhibits no distension. There is no tenderness.  Genitourinary:  Patient declined pelvic exam  Musculoskeletal: Normal range of motion. She exhibits no edema and no tenderness.  Neurological: She is alert. Coordination normal.  Skin: Skin is warm and dry. No rash noted.  Psychiatric: She has a normal mood and affect.    ED Course  Procedures (including critical care time) Labs Review Labs Reviewed  URINALYSIS, ROUTINE W REFLEX MICROSCOPIC  PREGNANCY, URINE    Imaging Review No results found.   EKG Interpretation None     Results for orders placed during the hospital encounter of 07/09/14  URINALYSIS, ROUTINE W REFLEX MICROSCOPIC      Result Value Ref Range   Color, Urine YELLOW  YELLOW   APPearance CLEAR  CLEAR   Specific Gravity, Urine 1.022  1.005 - 1.030   pH 6.5  5.0 - 8.0   Glucose, UA NEGATIVE  NEGATIVE mg/dL   Hgb urine dipstick NEGATIVE  NEGATIVE   Bilirubin Urine NEGATIVE  NEGATIVE   Ketones, ur NEGATIVE  NEGATIVE mg/dL   Protein, ur NEGATIVE  NEGATIVE mg/dL    Urobilinogen, UA 0.2  0.0 - 1.0 mg/dL   Nitrite NEGATIVE  NEGATIVE   Leukocytes, UA NEGATIVE  NEGATIVE  PREGNANCY, URINE      Result Value Ref Range   Preg Test, Ur NEGATIVE  NEGATIVE   No results found.  MDM  Patient declined pelvic exam. She states she will see her gynecologist. There is no evidence of urinary tract infection on urinalysis Plan prescription pyridium,bp recheck 1 week. F/u with gynecologist Final diagnoses:  None  Dx #1dysuria #2 hypertension      Orlie Dakin, MD 07/09/14 2259

## 2014-07-09 NOTE — ED Notes (Signed)
Thinks she has a UTI.

## 2015-04-28 ENCOUNTER — Ambulatory Visit (INDEPENDENT_AMBULATORY_CARE_PROVIDER_SITE_OTHER): Payer: BLUE CROSS/BLUE SHIELD | Admitting: Endocrinology

## 2015-04-28 ENCOUNTER — Encounter: Payer: Self-pay | Admitting: Endocrinology

## 2015-04-28 VITALS — BP 118/70 | HR 77 | Temp 98.3°F | Ht 67.0 in | Wt 132.0 lb

## 2015-04-28 DIAGNOSIS — G90A Postural orthostatic tachycardia syndrome (POTS): Secondary | ICD-10-CM

## 2015-04-28 DIAGNOSIS — I951 Orthostatic hypotension: Secondary | ICD-10-CM

## 2015-04-28 DIAGNOSIS — E209 Hypoparathyroidism, unspecified: Secondary | ICD-10-CM | POA: Diagnosis not present

## 2015-04-28 DIAGNOSIS — R000 Tachycardia, unspecified: Secondary | ICD-10-CM

## 2015-04-28 LAB — BASIC METABOLIC PANEL
BUN: 19 mg/dL (ref 6–23)
CO2: 30 mEq/L (ref 19–32)
CREATININE: 0.77 mg/dL (ref 0.40–1.20)
Calcium: 8.9 mg/dL (ref 8.4–10.5)
Chloride: 100 mEq/L (ref 96–112)
GFR: 94.24 mL/min (ref >60.00)
Glucose, Bld: 123 mg/dL — ABNORMAL HIGH (ref 70–99)
Potassium: 3.5 mEq/L (ref 3.5–5.1)
Sodium: 138 mEq/L (ref 135–145)

## 2015-04-28 LAB — VITAMIN D 25 HYDROXY (VIT D DEFICIENCY, FRACTURES): VITD: 40 ng/mL (ref 30.00–100.00)

## 2015-04-28 LAB — TSH: TSH: 1.19 u[IU]/mL (ref 0.35–4.50)

## 2015-04-28 NOTE — Patient Instructions (Signed)
blood tests are requested for you today.  We'll let you know about the results. Please consider taking "Natpara."  Let me know if you want to take it. Please come back for a follow-up appointment in 6 months.

## 2015-04-28 NOTE — Progress Notes (Signed)
Subjective:    Patient ID: Alisha Copeland, female    DOB: 01-19-1986, 29 y.o.   MRN: [id number]  HPI Pt was dx'ed with hypoparathyroidism at age 25.  She has been on rocaltrol since then.  at age 70, pt says she was hospitalized in ICU for ARF, due to overtreatment/hypercalcemia.  She has no h/o adrenal dz, infertility, vitiligo, urolithiasis, ceilac disease, yeast infections, pernicious anemia, or liver disease.  She is considering a pregnancy at some point, but hesitates, due this condition.  She has chronic sxs of abd pressure sensation at the epigastric area, and assoc nausea.  Pt says she had genetic testing many years ago, but no hereditary syndrome was found.   Past Medical History  Diagnosis Date  . POTS (postural orthostatic tachycardia syndrome)   . Hypoparathyroidism   . GERD (gastroesophageal reflux disease)   . Asthma   . Hypertension   . Chronic kidney disease     UTI's    No past surgical history on file.  History   Social History  . Marital Status: Single    Spouse Name: N/A  . Number of Children: N/A  . Years of Education: N/A   Occupational History  . Not on file.   Social History Main Topics  . Smoking status: Never Smoker   . Smokeless tobacco: Never Used  . Alcohol Use: Yes     Comment: occassionaly  . Drug Use: No  . Sexual Activity: Yes    Birth Control/ Protection: Pill   Other Topics Concern  . Not on file   Social History Narrative    Current Outpatient Prescriptions on File Prior to Visit  Medication Sig Dispense Refill  . albuterol (PROVENTIL HFA;VENTOLIN HFA) 108 (90 BASE) MCG/ACT inhaler Inhale 2 puffs into the lungs every 6 (six) hours as needed for wheezing. 1 Inhaler 1  . ALPRAZolam (XANAX) 0.5 MG tablet Take 0.5 mg by mouth at bedtime as needed.    . calcium carbonate (OS-CAL) 600 MG TABS Take 600 mg by mouth 2 (two) times daily with a meal.    . carvedilol (COREG) 3.125 MG tablet Take 1 tablet (3.125 mg total) by mouth 2 (two)  times daily with a meal. 180 tablet 3  . cloNIDine (CATAPRES) 0.1 MG tablet Take 0.1 mg by mouth 2 (two) times daily.    Donnetta Hail Estradiol (MONONESSA PO) Take by mouth.    . fluticasone (FLONASE) 50 MCG/ACT nasal spray Place 2 sprays into the nose daily. 16 g 0   No current facility-administered medications on file prior to visit.    Allergies  Allergen Reactions  . Beta Adrenergic Blockers   . Ciprofloxacin   . Drospirenone-Ethinyl Estradiol   . Methylphenidate Hcl   . Nitrofurantoin   . Sertraline Hcl   . Sulfamethoxazole-Trimethoprim   . Vancomycin     Family History  Problem Relation Age of Onset  . Hypertension Father   . Hypertension Paternal Grandfather   . Lung cancer Maternal Grandfather   . Diabetes Maternal Grandmother   . Crohn's disease Maternal Grandmother   . Irritable bowel syndrome Mother   . Colon cancer Neg Hx   . Rectal cancer Neg Hx   . Stomach cancer Neg Hx   . Other Neg Hx     hypoparathyroidism    BP 118/70 mmHg  Pulse 77  Temp(Src) 98.3 F (36.8 C) (Oral)  Ht 5\' 7"  (1.702 m)  Wt 132 lb (59.875 kg)  BMI 20.67 kg/m2  SpO2 97%  Review of Systems Denies sob, allergy symptoms, rash, numbness, hair loss, hematuria, diarrhea, depression, or weight change.  She has normal menses.  She has intermittent leg cramps.  She has intermittent palpitations and slight blurry vision.      Objective:   Physical Exam VS: see vs page GEN: no distress HEAD: head: no deformity eyes: no periorbital swelling, no proptosis external nose and ears are normal mouth: no lesion seen NECK: supple, thyroid is not enlarged CHEST WALL: no deformity LUNGS:  Clear to auscultation CV: reg rate and rhythm, no murmur ABD: abdomen is soft, nontender.  no hepatosplenomegaly.  not distended.  no hernia MUSCULOSKELETAL: muscle bulk and strength are grossly normal.  no obvious joint swelling.  gait is normal and steady EXTEMITIES: no deformity.  no ulcer on the  feet.  feet are of normal color and temp.  no edema PULSES: dorsalis pedis intact bilat.  no carotid bruit NEURO:  cn 2-12 grossly intact.   readily moves all 4's.  sensation is intact to touch on the feet SKIN:  Normal texture and temperature.  No rash or suspicious lesion is visible.  No vitiligo. NODES:  None palpable at the neck PSYCH: alert, well-oriented.  Does not appear anxious nor depressed.   outside test results are reviewed: Mg++=normal  i reviewed outside records: rocaltrol was increased a few mos ago, due to low Ca++ level  Lab Results  Component Value Date   PTH 2* 04/28/2015   CALCIUM 8.9 04/28/2015   CALCIUM 8.9 04/28/2015   CAION 1.01* 07/22/2008   PHOS 4.6 07/15/2008   Lab Results  Component Value Date   CREATININE 0.77 04/28/2015   BUN 19 04/28/2015   NA 138 04/28/2015   K 3.5 04/28/2015   CL 100 04/28/2015   CO2 30 04/28/2015       Assessment & Plan:  Chronic idiopathic hypoparathyroidism, new to me, well-controlled.  Pt declines renal US and 14-HR urine Ca++ for now.   abd sensation, uncertain etiology.  Pt should have eval if it persists  Patient is advised the following: Patient Instructions  blood tests are requested for you today.  We'll let you know about the results. Please consider taking "Natpara."  Let me know if you want to take it. Please come back for a follow-up appointment in 6 months.

## 2015-04-29 LAB — PTH, INTACT AND CALCIUM
CALCIUM: 8.9 mg/dL (ref 8.4–10.5)
PTH: 2 pg/mL — AB (ref 14–64)

## 2015-06-08 ENCOUNTER — Telehealth: Payer: Self-pay | Admitting: Endocrinology

## 2015-06-08 NOTE — Telephone Encounter (Signed)
please call patient: Calcium is in a good range Please continue the same medication. i'll see you next time.

## 2015-06-09 NOTE — Telephone Encounter (Signed)
Pt advised of note below and voiced understanding.  

## 2015-06-11 ENCOUNTER — Telehealth: Payer: Self-pay | Admitting: Endocrinology

## 2015-06-11 NOTE — Telephone Encounter (Signed)
Pt advised of note below and voiced understanding.  

## 2015-06-11 NOTE — Telephone Encounter (Signed)
please call patient: We received lab results. Please continue the same medications i'll see you next time.

## 2015-08-01 DIAGNOSIS — J45909 Unspecified asthma, uncomplicated: Secondary | ICD-10-CM | POA: Insufficient documentation

## 2015-10-29 ENCOUNTER — Encounter: Payer: Self-pay | Admitting: Endocrinology

## 2015-10-29 ENCOUNTER — Ambulatory Visit (INDEPENDENT_AMBULATORY_CARE_PROVIDER_SITE_OTHER): Payer: BLUE CROSS/BLUE SHIELD | Admitting: Endocrinology

## 2015-10-29 VITALS — BP 118/80 | HR 75 | Temp 98.7°F | Ht 67.0 in | Wt 133.0 lb

## 2015-10-29 DIAGNOSIS — E209 Hypoparathyroidism, unspecified: Secondary | ICD-10-CM | POA: Diagnosis not present

## 2015-10-29 LAB — BASIC METABOLIC PANEL
BUN: 15 mg/dL (ref 6–23)
CO2: 31 meq/L (ref 19–32)
Calcium: 9.1 mg/dL (ref 8.4–10.5)
Chloride: 100 mEq/L (ref 96–112)
Creatinine, Ser: 0.71 mg/dL (ref 0.40–1.20)
GFR: 103.13 mL/min (ref >60.00)
Glucose, Bld: 82 mg/dL (ref 70–99)
Potassium: 4 mEq/L (ref 3.5–5.1)
SODIUM: 139 meq/L (ref 135–145)

## 2015-10-29 NOTE — Patient Instructions (Addendum)
blood tests are requested for you today.  We'll let you know about the results. Please come back for a follow-up appointment in 6 months In view of your medical condition, you should avoid pregnancy until we have decided it is safe.

## 2015-10-29 NOTE — Progress Notes (Signed)
Subjective:    Patient ID: Alisha Copeland, female    DOB: [birth year], 30 y.o.   MRN: [id number]  HPI Pt returns for f/u of primary hypoparathyroidism (dx'ed at age 68; she has been on rocaltrol since then; at age 31, pt says she was hospitalized in ICU for ARF, due to overtreatment/hypercalcemia; she has no h/o adrenal dz, infertility, vitiligo, urolithiasis, ceilac disease, yeast infections, pernicious anemia, or liver disease; she is considering a pregnancy at some point, but hesitates, due this condition; pt says she had genetic testing many years ago, but no hereditary syndrome was found; she declines natpara).  pt states she feels well in general, and takes meds as rx'ed. Past Medical History  Diagnosis Date  . POTS (postural orthostatic tachycardia syndrome)   . Hypoparathyroidism ([place])   . GERD (gastroesophageal reflux disease)   . Asthma   . Hypertension   . Chronic kidney disease     UTI's    No past surgical history on file.  Social History   Social History  . Marital Status: Single    Spouse Name: N/A  . Number of Children: N/A  . Years of Education: N/A   Occupational History  . Not on file.   Social History Main Topics  . Smoking status: Never Smoker   . Smokeless tobacco: Never Used  . Alcohol Use: Yes     Comment: occassionaly  . Drug Use: No  . Sexual Activity: Yes    Birth Control/ Protection: Pill   Other Topics Concern  . Not on file   Social History Narrative    Current Outpatient Prescriptions on File Prior to Visit  Medication Sig Dispense Refill  . albuterol (PROVENTIL HFA;VENTOLIN HFA) 108 (90 BASE) MCG/ACT inhaler Inhale 2 puffs into the lungs every 6 (six) hours as needed for wheezing. 1 Inhaler 1  . ALPRAZolam (XANAX) 0.5 MG tablet Take 0.5 mg by mouth at bedtime as needed.    . calcitRIOL (ROCALTROL) 0.5 MCG capsule Take 1 mcg by mouth 2 (two) times daily.     . calcium carbonate (OS-CAL) 600 MG TABS Take 600 mg by mouth 3 (three)  times daily.     . carvedilol (COREG) 3.125 MG tablet Take 1 tablet (3.125 mg total) by mouth 2 (two) times daily with a meal. 180 tablet 3  . Norgestimate-Eth Estradiol (MONONESSA PO) Take by mouth.     No current facility-administered medications on file prior to visit.    Allergies  Allergen Reactions  . Beta Adrenergic Blockers   . Ciprofloxacin   . Drospirenone-Ethinyl Estradiol   . Methylphenidate Hcl   . Nitrofurantoin   . Sertraline Hcl   . Sulfamethoxazole-Trimethoprim   . Vancomycin     Family History  Problem Relation Age of Onset  . Hypertension Father   . Hypertension Paternal Grandfather   . Lung cancer Maternal Grandfather   . Diabetes Maternal Grandmother   . Crohn's disease Maternal Grandmother   . Irritable bowel syndrome Mother   . Colon cancer Neg Hx   . Rectal cancer Neg Hx   . Stomach cancer Neg Hx   . Other Neg Hx     hypoparathyroidism    BP 118/80 mmHg  Pulse 75  Temp(Src) 98.7 F (37.1 C) (Oral)  Ht 5\' 7"  (1.702 m)  Wt 133 lb (60.328 kg)  BMI 20.83 kg/m2  SpO2 97%  Review of Systems She denies muscle cramps.  She has some leg numbness.    Objective:  Physical Exam VITAL SIGNS:  See vs page GENERAL: no distress NECK: There is no palpable thyroid enlargement.  No thyroid nodule is palpable.  No palpable lymphadenopathy at the anterior neck. Ext: no edema   Lab Results  Component Value Date   PTH 2* 04/28/2015   CALCIUM 9.1 10/29/2015   CAION 1.01* 07/22/2008   PHOS 4.6 07/15/2008      Assessment & Plan:  Primary hypoparathyroidism: well-controlled.    Patient is advised the following: Patient Instructions  blood tests are requested for you today.  We'll let you know about the results. Please come back for a follow-up appointment in 6 months In view of your medical condition, you should avoid pregnancy until we have decided it is safe.

## 2015-11-02 LAB — PTH, INTACT AND CALCIUM
Calcium: 9.2 mg/dL (ref 8.4–10.5)
PTH: 2 pg/mL — ABNORMAL LOW (ref 14–64)

## 2015-11-03 ENCOUNTER — Encounter: Payer: Self-pay | Admitting: Endocrinology

## 2015-12-16 ENCOUNTER — Encounter (HOSPITAL_COMMUNITY): Payer: Self-pay | Admitting: Emergency Medicine

## 2015-12-16 ENCOUNTER — Emergency Department (HOSPITAL_COMMUNITY)
Admission: EM | Admit: 2015-12-16 | Discharge: 2015-12-16 | Disposition: A | Discharge status: Home / Self Care | Payer: BLUE CROSS/BLUE SHIELD | Attending: Emergency Medicine | Admitting: Emergency Medicine

## 2015-12-16 DIAGNOSIS — Z79899 Other long term (current) drug therapy: Secondary | ICD-10-CM | POA: Diagnosis not present

## 2015-12-16 DIAGNOSIS — T7840XA Allergy, unspecified, initial encounter: Secondary | ICD-10-CM | POA: Insufficient documentation

## 2015-12-16 DIAGNOSIS — Z87440 Personal history of urinary (tract) infections: Secondary | ICD-10-CM | POA: Insufficient documentation

## 2015-12-16 DIAGNOSIS — N189 Chronic kidney disease, unspecified: Secondary | ICD-10-CM | POA: Insufficient documentation

## 2015-12-16 DIAGNOSIS — X58XXXA Exposure to other specified factors, initial encounter: Secondary | ICD-10-CM | POA: Insufficient documentation

## 2015-12-16 DIAGNOSIS — K219 Gastro-esophageal reflux disease without esophagitis: Secondary | ICD-10-CM | POA: Diagnosis not present

## 2015-12-16 DIAGNOSIS — Y9389 Activity, other specified: Secondary | ICD-10-CM | POA: Insufficient documentation

## 2015-12-16 DIAGNOSIS — E209 Hypoparathyroidism, unspecified: Secondary | ICD-10-CM | POA: Diagnosis not present

## 2015-12-16 DIAGNOSIS — Y9289 Other specified places as the place of occurrence of the external cause: Secondary | ICD-10-CM | POA: Insufficient documentation

## 2015-12-16 DIAGNOSIS — I129 Hypertensive chronic kidney disease with stage 1 through stage 4 chronic kidney disease, or unspecified chronic kidney disease: Secondary | ICD-10-CM | POA: Diagnosis not present

## 2015-12-16 DIAGNOSIS — J45909 Unspecified asthma, uncomplicated: Secondary | ICD-10-CM | POA: Diagnosis not present

## 2015-12-16 DIAGNOSIS — Y998 Other external cause status: Secondary | ICD-10-CM | POA: Diagnosis not present

## 2015-12-16 DIAGNOSIS — L299 Pruritus, unspecified: Secondary | ICD-10-CM | POA: Diagnosis present

## 2015-12-16 DIAGNOSIS — Z79818 Long term (current) use of other agents affecting estrogen receptors and estrogen levels: Secondary | ICD-10-CM | POA: Diagnosis not present

## 2015-12-16 LAB — I-STAT CHEM 8, ED
BUN: 13 mg/dL (ref 6–20)
CALCIUM ION: 0.89 mmol/L — AB (ref 1.12–1.23)
CREATININE: 0.7 mg/dL (ref 0.44–1.00)
Chloride: 100 mmol/L — ABNORMAL LOW (ref 101–111)
Glucose, Bld: 96 mg/dL (ref 65–99)
HCT: 39 % (ref 36.0–46.0)
Hemoglobin: 13.3 g/dL (ref 12.0–15.0)
Potassium: 3.5 mmol/L (ref 3.5–5.1)
SODIUM: 142 mmol/L (ref 135–145)
TCO2: 24 mmol/L (ref 0–100)

## 2015-12-16 MED ORDER — FAMOTIDINE 20 MG PO TABS: 20.0000 mg | ORAL_TABLET | Freq: Two times a day (BID) | ORAL | Status: DC

## 2015-12-16 MED ORDER — PREDNISONE 10 MG PO TABS: 20.0000 mg | ORAL_TABLET | Freq: Every day | ORAL | Status: DC

## 2015-12-16 MED ORDER — EPINEPHRINE 0.3 MG/0.3ML IJ SOAJ
0.3000 mg | Freq: Once | INTRAMUSCULAR | Status: AC
  Administered 2015-12-16: 0.3 mg via INTRAMUSCULAR
  Filled 2015-12-16: qty 0.3

## 2015-12-16 MED ORDER — FAMOTIDINE IN NACL 20-0.9 MG/50ML-% IV SOLN
20.0000 mg | Freq: Once | INTRAVENOUS | Status: AC
  Administered 2015-12-16: 20 mg via INTRAVENOUS
  Filled 2015-12-16: qty 50

## 2015-12-16 MED ORDER — EPINEPHRINE 0.3 MG/0.3ML IJ SOAJ: INTRAMUSCULAR | Status: DC

## 2015-12-16 MED ORDER — METHYLPREDNISOLONE SODIUM SUCC 125 MG IJ SOLR
125.0000 mg | Freq: Once | INTRAMUSCULAR | Status: AC
  Administered 2015-12-16: 125 mg via INTRAVENOUS
  Filled 2015-12-16: qty 2

## 2015-12-16 MED ORDER — DIPHENHYDRAMINE HCL 50 MG/ML IJ SOLN
50.0000 mg | Freq: Once | INTRAMUSCULAR | Status: AC
  Administered 2015-12-16: 50 mg via INTRAVENOUS
  Filled 2015-12-16: qty 1

## 2015-12-16 MED ORDER — DIPHENHYDRAMINE HCL 50 MG/ML IJ SOLN
25.0000 mg | Freq: Once | INTRAMUSCULAR | Status: AC
  Administered 2015-12-16: 25 mg via INTRAVENOUS
  Filled 2015-12-16: qty 1

## 2015-12-16 MED ORDER — SODIUM CHLORIDE 0.9 % IV BOLUS (SEPSIS)
1000.0000 mL | Freq: Once | INTRAVENOUS | Status: AC
  Administered 2015-12-16: 1000 mL via INTRAVENOUS

## 2015-12-16 NOTE — Discharge Instructions (Signed)
Take Benadryl 25 mg every 4-6 hours for rash and itching. If allergic reaction gets worse give yourself the EpiPen and return to emergency department. Follow-up with one of your doctors next week

## 2015-12-16 NOTE — ED Notes (Signed)
Explained discharge instructions in detail prior to pt departure; she verbalized understanding of medications and states that, should she have the need to use Epi-Pen at home, that she will immediately return to the ED for reevaluation.  No acute distress noted; rash and facial swelling are still visible but are significantly improved over condition on arrival.

## 2015-12-16 NOTE — ED Notes (Signed)
Pt has significant facial swelling and generalized burning/itchy rash after taking Tamiflu last night at 9pm.  She has had tamiflu before without incident.  She says that she was already itching when she took the medication last night and is unsure whether or not the medication caused her reaction.  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 No acute distress noted.  No pain reported but she states that her eyelids feel "sore."

## 2015-12-16 NOTE — ED Notes (Addendum)
Patient presents for allergic reaction, states she took one dose of tamiflu yesterday. Patient rash to face with painful sinuses upon palpation. Denies trouble swallowing, tongue swelling, no vocal changes, speaking in complete sentences, no drooling noted.

## 2015-12-17 NOTE — ED Provider Notes (Signed)
CSN: [id number]     Arrival date & time 12/16/15  1145 History   First MD Initiated Contact with Patient 12/16/15 1239     Chief Complaint  Patient presents with  . Facial Swelling  . Cough     (Consider location/radiation/quality/duration/timing/severity/associated sxs/prior Treatment) Patient is a 30 y.o. female presenting with allergic reaction. The history is provided by the patient (Patient states that she started with swelling in her face itching and rash to face chest and abdomen today).  Allergic Reaction Presenting symptoms: itching and rash   Presenting symptoms: no difficulty swallowing   Itching:    Location:  Lips   Severity:  Moderate   Onset quality:  Sudden Severity:  Moderate Context: no animal exposure   Relieved by:  Antihistamines Worsened by:  Nothing tried Ineffective treatments:  Antihistamines   Past Medical History  Diagnosis Date  . POTS (postural orthostatic tachycardia syndrome)   . Hypoparathyroidism ([place])   . GERD (gastroesophageal reflux disease)   . Asthma   . Hypertension   . Chronic kidney disease     UTI's   History reviewed. No pertinent past surgical history. Family History  Problem Relation Age of Onset  . Hypertension Father   . Hypertension Paternal Grandfather   . Lung cancer Maternal Grandfather   . Diabetes Maternal Grandmother   . Crohn's disease Maternal Grandmother   . Irritable bowel syndrome Mother   . Colon cancer Neg Hx   . Rectal cancer Neg Hx   . Stomach cancer Neg Hx   . Other Neg Hx     hypoparathyroidism   Social History  Substance Use Topics  . Smoking status: Never Smoker   . Smokeless tobacco: Never Used  . Alcohol Use: Yes     Comment: occassionaly   OB History    No data available     Review of Systems  Constitutional: Negative for appetite change and fatigue.  HENT: Negative for congestion, ear discharge, sinus pressure and trouble swallowing.   Eyes: Negative for discharge.  Respiratory:  Negative for cough.   Cardiovascular: Negative for chest pain.  Gastrointestinal: Negative for abdominal pain and diarrhea.  Genitourinary: Negative for frequency and hematuria.  Musculoskeletal: Negative for back pain.  Skin: Positive for itching and rash.  Neurological: Negative for seizures and headaches.  Psychiatric/Behavioral: Negative for hallucinations.      Allergies  Beta adrenergic blockers; Cisapride; Methylphenidate hcl; Nitrofurantoin; Propranolol; Sertraline hcl; Sulfamethoxazole-trimethoprim; Amphetamine; Ciprofloxacin; Dextroamphetamine; and Vancomycin  Home Medications   Prior to Admission medications   Medication Sig Start Date End Date Taking? Authorizing Provider  adapalene (DIFFERIN) 0.1 % gel Apply 1 application topically daily as needed. acne 11/11/15  Yes Historical Provider, MD  albuterol (PROVENTIL HFA;VENTOLIN HFA) 108 (90 BASE) MCG/ACT inhaler Inhale 2 puffs into the lungs every 6 (six) hours as needed for wheezing. 08/12/12  Yes Janne Napoleon, NP  ALPRAZolam Duanne Moron) 0.5 MG tablet Take 0.5 mg by mouth daily as needed for anxiety or sleep.    Yes Historical Provider, MD  calcitRIOL (ROCALTROL) 0.5 MCG capsule Take 1 mcg by mouth 2 (two) times daily.    Yes Historical Provider, MD  calcium carbonate (OS-CAL) 600 MG TABS Take 600-1,200 mg by mouth 2 (two) times daily with a meal. Take 1200mg  in the morning and 600mg  at night   Yes Historical Provider, MD  carvedilol (COREG) 6.25 MG tablet Take 6.25 mg by mouth 2 (two) times daily with a meal.   Yes Historical Provider, MD  hydrOXYzine (ATARAX/VISTARIL) 25 MG tablet Take 25 mg by mouth 3 (three) times daily as needed. itching 10/27/15  Yes Historical Provider, MD  loratadine (CLARITIN) 10 MG tablet Take 10 mg by mouth daily.   Yes Historical Provider, MD  Norgestimate-Eth Estradiol (MONONESSA PO) Take 1 tablet by mouth daily.    Yes Historical Provider, MD  oseltamivir (TAMIFLU) 75 MG capsule Take 75 mg by mouth 2  (two) times daily.   Yes Historical Provider, MD  ranitidine (ZANTAC) 150 MG tablet Take 150 mg by mouth 2 (two) times daily.   Yes Historical Provider, MD  carvedilol (COREG) 3.125 MG tablet Take 1 tablet (3.125 mg total) by mouth 2 (two) times daily with a meal. Patient not taking: Reported on 12/16/2015 01/24/13   Fay Records, MD  EPINEPHrine 0.3 mg/0.3 mL IJ SOAJ injection Use if necessary for allergic reaction 12/16/15   Milton Ferguson, MD  famotidine (PEPCID) 20 MG tablet Take 1 tablet (20 mg total) by mouth 2 (two) times daily. 12/16/15   Milton Ferguson, MD  predniSONE (DELTASONE) 10 MG tablet Take 2 tablets (20 mg total) by mouth daily. 12/16/15   Milton Ferguson, MD   BP 135/94 mmHg  Pulse 91  Temp(Src) 98.1 F (36.7 C) (Oral)  Resp 16  SpO2 99%  LMP 11/25/2015 Physical Exam  Constitutional: She is oriented to person, place, and time. She appears well-developed.  HENT:  Head: Normocephalic.  Patient has swelling around her lips. Oropharynx nl  Eyes: Conjunctivae and EOM are normal. No scleral icterus.  Neck: Neck supple. No thyromegaly present.  Cardiovascular: Normal rate and regular rhythm.  Exam reveals no gallop and no friction rub.   No murmur heard. Pulmonary/Chest: No stridor. She has no wheezes. She has no rales. She exhibits no tenderness.  Abdominal: She exhibits no distension. There is no tenderness. There is no rebound.  Musculoskeletal: Normal range of motion. She exhibits no edema.  Lymphadenopathy:    She has no cervical adenopathy.  Neurological: She is oriented to person, place, and time. She exhibits normal muscle tone. Coordination normal.  Skin: Rash noted. No erythema.  Rash to face chest and abdomen  Psychiatric: She has a normal mood and affect. Her behavior is normal.    ED Course  Procedures (including critical care time) Labs Review Labs Reviewed  I-STAT CHEM 8, ED - Abnormal; Notable for the following:    Chloride 100 (*)    Calcium, Ion 0.89 (*)     All other components within normal limits    Imaging Review No results found. I have personally reviewed and evaluated these images and lab results as part of my medical decision-making.   EKG Interpretation None      MDM   Final diagnoses:  Allergic reaction, initial encounter    Patient with allergic reaction. She responded well to treatment in emergency department. She will be sent home on Benadryl appendectomy and steroids    Milton Ferguson, MD 12/17/15 1330

## 2016-02-03 ENCOUNTER — Telehealth: Payer: Self-pay | Admitting: Endocrinology

## 2016-02-03 DIAGNOSIS — E209 Hypoparathyroidism, unspecified: Secondary | ICD-10-CM

## 2016-02-03 NOTE — Telephone Encounter (Signed)
Patient stated that she is having foot cramps, can she get some blood work drawn soon.

## 2016-02-04 ENCOUNTER — Encounter: Payer: Self-pay | Admitting: Endocrinology

## 2016-02-04 ENCOUNTER — Other Ambulatory Visit (INDEPENDENT_AMBULATORY_CARE_PROVIDER_SITE_OTHER): Payer: BLUE CROSS/BLUE SHIELD

## 2016-02-04 DIAGNOSIS — E209 Hypoparathyroidism, unspecified: Secondary | ICD-10-CM

## 2016-02-04 LAB — BASIC METABOLIC PANEL
BUN: 16 mg/dL (ref 6–23)
CHLORIDE: 102 meq/L (ref 96–112)
CO2: 27 meq/L (ref 19–32)
Calcium: 8.5 mg/dL (ref 8.4–10.5)
Creatinine, Ser: 0.78 mg/dL (ref 0.40–1.20)
GFR: 92.36 mL/min (ref >60.00)
Glucose, Bld: 102 mg/dL — ABNORMAL HIGH (ref 70–99)
POTASSIUM: 3.7 meq/L (ref 3.5–5.1)
SODIUM: 139 meq/L (ref 135–145)

## 2016-02-04 LAB — MAGNESIUM: MAGNESIUM: 1.8 mg/dL (ref 1.5–2.5)

## 2016-02-04 NOTE — Telephone Encounter (Signed)
Pt is coming at 2:30 pm today to have blood test done.

## 2016-02-04 NOTE — Telephone Encounter (Signed)
See note below and please advise, Thanks! 

## 2016-02-04 NOTE — Telephone Encounter (Signed)
I contacted the pt via mychart and advised we would like for her to come in for blood tests. I have requested the pt to come in this afternoon and I am waiting on a response from the pt.

## 2016-02-04 NOTE — Telephone Encounter (Signed)
Attempted to reach the pt. Pt was unavailable will try again at a later time.  

## 2016-02-04 NOTE — Telephone Encounter (Signed)
i ordered. Please come in to draw

## 2016-02-07 LAB — PTH, INTACT AND CALCIUM
CALCIUM: 8.3 mg/dL — AB (ref 8.4–10.5)
PTH: 3 pg/mL — AB (ref 14–64)

## 2016-03-28 ENCOUNTER — Encounter: Payer: Self-pay | Admitting: Internal Medicine

## 2016-04-04 ENCOUNTER — Institutional Professional Consult (permissible substitution): Payer: BLUE CROSS/BLUE SHIELD | Admitting: Internal Medicine

## 2016-04-28 ENCOUNTER — Ambulatory Visit: Payer: BLUE CROSS/BLUE SHIELD | Admitting: Endocrinology

## 2016-06-02 ENCOUNTER — Ambulatory Visit (INDEPENDENT_AMBULATORY_CARE_PROVIDER_SITE_OTHER): Payer: BLUE CROSS/BLUE SHIELD | Admitting: Internal Medicine

## 2016-06-02 VITALS — BP 131/89 | HR 68 | Ht 67.0 in | Wt 141.8 lb

## 2016-06-02 DIAGNOSIS — G909 Disorder of the autonomic nervous system, unspecified: Secondary | ICD-10-CM

## 2016-06-02 DIAGNOSIS — G901 Familial dysautonomia [Riley-Day]: Secondary | ICD-10-CM

## 2016-06-02 MED ORDER — ATENOLOL 50 MG PO TABS: 50.0000 mg | ORAL_TABLET | Freq: Every day | ORAL | 0 refills | Status: DC

## 2016-06-02 MED ORDER — METOPROLOL TARTRATE 50 MG PO TABS: 50.0000 mg | ORAL_TABLET | Freq: Two times a day (BID) | ORAL | 0 refills | Status: DC

## 2016-06-02 MED ORDER — LABETALOL HCL 200 MG PO TABS: 200.0000 mg | ORAL_TABLET | Freq: Two times a day (BID) | ORAL | 0 refills | Status: DC

## 2016-06-02 NOTE — Patient Instructions (Addendum)
Medication Instructions: - Your physician has recommended you make the following change in your medication:   * You are being given 3 different beta blocker prescriptions to try. You may take them in any order, but DO NOT take more than one at a time. Try to give at least 2 weeks per prescription to see how you tolerate.*   ^^STOP CARVEDILOL^^ 1) Labetolol 200 mg one tablet by mouth twice daily 2) Atenolol 50 mg one tablet by mouth once daily 3) Metoprolol tartrate 50 mg one tablet by mouth twice daily  Labwork: - none  Procedures/Testing: - none  Follow-Up: - Your physician recommends that you schedule a follow-up appointment in: 10-12 weeks with Dr. Caryl Comes.  Any Additional Special Instructions Will Be Listed Below (If Applicable).     If you need a refill on your cardiac medications before your next appointment, please call your pharmacy.

## 2016-06-02 NOTE — Progress Notes (Signed)
ELECTROPHYSIOLOGY CONSULT NOTE  Patient ID: Alisha Copeland, MRN: [id number], DOB/AGE: 1986-01-13 30 y.o. Admit date: (Not on file) Date of Consult: 06/02/2016  Primary Physician: Wende Neighbors, MD Primary Cardiologist: prev PR and [email or web address]  Consulting Physician  NA  Chief Complaint: POTS   HPI Alisha Copeland is a 30 y.o. female  Presents with a long hx  Of symptoms. The beginning when she was 70 or 30 years old. She was initially diagnosed with hypoparathyroidism. A year or 2 later she was diagnosed with POTS  at [facility]. She spent parts of the next year or 2  With multiple hospitalizations including time in the icu related to hypotension tachycardia. 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 There have been times where he had has abated and minutes had recurrences associated with infections I.e. Mono l and flu.   her history of medicines exposure is legion including vasoactive drugs, antideidepressants, and anxiolytics etc.   She has had GI and GU symptoms as well --recurrent UTIs  Seen by PR for years wihtout significant benefit   Went to [facility] and saw CFM with repeat tilt>> hyperadrenergic POTS  Treated them with carvedilol. Up titration is been associated with worsening fatigue which is right now the dominant symptom.  She has some dizziness. She has modest exercise intolerance.  She has shower intolerance. Her diet is salt deplete but she has been hypertensive. Her diet is fluid replete for the most part. She drinks a little bit of coffee.  She continues to struggle with the psychosocial issues related to the loss of her high school and college experiences.  She is now married for 2 years. She is scheduled to graduate from college in December       Past Medical History:  Diagnosis Date  . Asthma   . Chronic kidney disease    UTI's  . GERD (gastroesophageal reflux  disease)   . Hypertension   . Hypoparathyroidism ([place])   . POTS (postural orthostatic tachycardia syndrome)       Surgical History: No past surgical history on file.   Home Meds: Prior to Admission medications   Medication Sig Start Date End Date Taking? Authorizing Provider  adapalene (DIFFERIN) 0.1 % gel Apply 1 application topically daily as needed. acne 11/11/15  Yes Historical Provider, MD  albuterol (PROVENTIL HFA;VENTOLIN HFA) 108 (90 BASE) MCG/ACT inhaler Inhale 2 puffs into the lungs every 6 (six) hours as needed for wheezing. 08/12/12  Yes Janne Napoleon, NP  ALPRAZolam Duanne Moron) 0.5 MG tablet Take 0.5 mg by mouth daily as needed for anxiety or sleep.    Yes Historical Provider, MD  calcitRIOL (ROCALTROL) 0.5 MCG capsule Take 1 mcg by mouth 2 (two) times daily.    Yes Historical Provider, MD  calcium carbonate (OS-CAL) 600 MG TABS Take 600-1,200 mg by mouth 2 (two) times daily with a meal. Take 1200mg  in the morning and 600mg  at night   Yes Historical Provider, MD  carvedilol (COREG) 6.25 MG tablet Take 6.25 mg by mouth 2 (two) times daily with a meal.   Yes Historical Provider, MD  EPINEPHrine 0.3 mg/0.3 mL IJ SOAJ injection Inject 0.3 mg into the muscle as directed. Inject into the muscle once as needed for anaphylaxis reaction   Yes Historical Provider, MD  Fexofenadine HCl (ALLERGY 24-HR PO) Take 180 mg by mouth daily.   Yes Historical  Provider, MD  hydrOXYzine (ATARAX/VISTARIL) 25 MG tablet Take 25 mg by mouth 3 (three) times daily as needed for itching.   Yes Historical Provider, MD  Norgestimate-Eth Estradiol (MONONESSA PO) Take 1 tablet by mouth daily.    Yes Historical Provider, MD    Allergies:  Allergies  Allergen Reactions  . Beta Adrenergic Blockers     Lethargic, increased tachycardia  . Cisapride Other (See Comments)    Prolonged QT syndrome  . Methylphenidate Hcl Other (See Comments)    Increased tachycardia  . Nitrofurantoin Nausea And Vomiting  . Propranolol  Other (See Comments)    Lethargy and fatigue  . Sertraline Hcl Diarrhea  . Sulfamethoxazole-Trimethoprim Nausea And Vomiting  . Amphetamine Palpitations  . Ciprofloxacin Hives and Rash  . Dextroamphetamine Palpitations  . Vancomycin Hives and Rash    Social History   Social History  . Marital status: Single    Spouse name: N/A  . Number of children: N/A  . Years of education: N/A   Occupational History  . Not on file.   Social History Main Topics  . Smoking status: Never Smoker  . Smokeless tobacco: Never Used  . Alcohol use Yes     Comment: occassionaly  . Drug use: No  . Sexual activity: Yes    Birth control/ protection: Pill   Other Topics Concern  . Not on file   Social History Narrative  . No narrative on file     Family History  Problem Relation Age of Onset  . Hypertension Father   . Irritable bowel syndrome Mother   . Hypertension Paternal Grandfather   . Lung cancer Maternal Grandfather   . Diabetes Maternal Grandmother   . Crohn's disease Maternal Grandmother   . Colon cancer Neg Hx   . Rectal cancer Neg Hx   . Stomach cancer Neg Hx   . Other Neg Hx     hypoparathyroidism     ROS:  Please see the history of present illness.     All other systems reviewed and negative.    Physical Exam: Blood pressure 131/89, pulse 68, height 5\' 7"  (1.702 m), weight 141 lb 12.8 oz (64.3 kg). General: Well developed, well nourished female in no acute distress. Head: Normocephalic, atraumatic, sclera non-icteric, no xanthomas, nares are without discharge. EENT: normal  Lymph Nodes:  none Neck: Negative for carotid bruits. JVD not elevated. Back:without scoliosis kyphosis Lungs: Clear bilaterally to auscultation without wheezes, rales, or rhonchi. Breathing is unlabored. Heart: RRR with S1 S2. No   murmur . No rubs, or gallops appreciated. Abdomen: Soft, non-tender, non-distended with normoactive bowel sounds. No hepatomegaly. No rebound/guarding. No obvious  abdominal masses. Msk:  Strength and tone appear normal for age. Extremities: No clubbing or cyanosis. No edema.  Distal pedal pulses are 2+ and equal bilaterally. Skin: Warm and Dry Neuro: Alert and oriented X 3. CN III-XII intact Grossly normal sensory and motor function . Psych:  Responds to questions appropriately with a normal affect.      Labs: Cardiac Enzymes No results for input(s): CKTOTAL, CKMB, TROPONINI in the last 72 hours. CBC Lab Results  Component Value Date   WBC 8.4 12/04/2012   HGB 13.3 12/16/2015   HCT 39.0 12/16/2015   MCV 86.8 12/04/2012   PLT 275.0 12/04/2012   PROTIME: No results for input(s): LABPROT, INR in the last 72 hours. Chemistry No results for input(s): NA, K, CL, CO2, BUN, CREATININE, CALCIUM, PROT, BILITOT, ALKPHOS, ALT, AST, GLUCOSE in the last  168 hours.  Invalid input(s): LABALBU Lipids No results found for: CHOL, HDL, LDLCALC, TRIG BNP No results found for: PROBNP Thyroid Function Tests: No results for input(s): TSH, T4TOTAL, T3FREE, THYROIDAB in the last 72 hours.  Invalid input(s): FREET3 Miscellaneous No results found for: DDIMER  Radiology/Studies:  No results found.  EKG: none obtained   Assessment and Plan:  Hyperadrenergic POTS  Hypoparathyroidism  Stress  Fatigue   Her impression is taht carvedilol contributes to her fatigue,  We will try different betablockers--labetolol 200 bid, atenolol 50 qd, meto tartrate 50 bid    We have reviewed the physiology as relates to showers and cooler water.   She is encouraged to work on aerobic exercise.   Her GI symptoms are modestly well controlled at this point. Zofran might be something to use later.   I suggested that she consider grief counseling as relates to all the loss opportunity through high school and college with which she is still doing. I mentioned restoration place.   I have also given her the name of another patient with POTS  More than 50% of 75 min was  spent in counseling related to the above    Virl Axe

## 2016-06-06 ENCOUNTER — Encounter: Payer: Self-pay | Admitting: Endocrinology

## 2016-06-06 ENCOUNTER — Ambulatory Visit (INDEPENDENT_AMBULATORY_CARE_PROVIDER_SITE_OTHER): Payer: BLUE CROSS/BLUE SHIELD | Admitting: Endocrinology

## 2016-06-06 VITALS — BP 122/78 | HR 68 | Ht 67.0 in | Wt 141.0 lb

## 2016-06-06 DIAGNOSIS — E209 Hypoparathyroidism, unspecified: Secondary | ICD-10-CM

## 2016-06-06 LAB — BASIC METABOLIC PANEL
BUN: 12 mg/dL (ref 6–23)
CALCIUM: 8.4 mg/dL (ref 8.4–10.5)
CO2: 29 mEq/L (ref 19–32)
CREATININE: 0.77 mg/dL (ref 0.40–1.20)
Chloride: 100 mEq/L (ref 96–112)
GFR: 93.53 mL/min (ref >60.00)
Glucose, Bld: 93 mg/dL (ref 70–99)
Potassium: 3.8 mEq/L (ref 3.5–5.1)
Sodium: 138 mEq/L (ref 135–145)

## 2016-06-06 LAB — PHOSPHORUS: PHOSPHORUS: 4.5 mg/dL (ref 2.3–4.6)

## 2016-06-06 LAB — MAGNESIUM: MAGNESIUM: 1.7 mg/dL (ref 1.5–2.5)

## 2016-06-06 NOTE — Progress Notes (Signed)
Subjective:    Patient ID: Alisha Copeland, female    DOB: 12/06/1985, 30 y.o.   MRN: [id number]  HPI Pt returns for f/u of primary hypoparathyroidism (dx'ed at age 88; she has been on rocaltrol since then; at age 1, pt says she was hospitalized in ICU for ARF, due to overtreatment/hypercalcemia; she has no h/o adrenal dz, infertility, vitiligo, urolithiasis, ceilac disease, yeast infections, pernicious anemia, or liver disease; she is considering a pregnancy at some point, but hesitates, due this condition; pt says she had genetic testing many years ago, but no hereditary syndrome was found; she declines Natpara).  pt states she feels well in general, and takes meds as rx'ed.  she is not considering a pregnancy now.  Past Medical History:  Diagnosis Date  . Asthma   . Chronic kidney disease    UTI's  . GERD (gastroesophageal reflux disease)   . Hypertension   . Hypoparathyroidism ([place])   . POTS (postural orthostatic tachycardia syndrome)     No past surgical history on file.  Social History   Social History  . Marital status: Single    Spouse name: N/A  . Number of children: N/A  . Years of education: N/A   Occupational History  . Not on file.   Social History Main Topics  . Smoking status: Never Smoker  . Smokeless tobacco: Never Used  . Alcohol use Yes     Comment: occassionaly  . Drug use: No  . Sexual activity: Yes    Birth control/ protection: Pill   Other Topics Concern  . Not on file   Social History Narrative  . No narrative on file    Current Outpatient Prescriptions on File Prior to Visit  Medication Sig Dispense Refill  . adapalene (DIFFERIN) 0.1 % gel Apply 1 application topically daily as needed. acne  0  . albuterol (PROVENTIL HFA;VENTOLIN HFA) 108 (90 BASE) MCG/ACT inhaler Inhale 2 puffs into the lungs every 6 (six) hours as needed for wheezing. 1 Inhaler 1  . ALPRAZolam (XANAX) 0.5 MG tablet Take 0.5 mg by mouth daily as needed for anxiety or  sleep.     . calcitRIOL (ROCALTROL) 0.5 MCG capsule Take 1 mcg by mouth 2 (two) times daily.     . calcium carbonate (OS-CAL) 600 MG TABS Take 600-1,200 mg by mouth 2 (two) times daily with a meal. Take 1200mg  in the morning and 600mg  at night    . EPINEPHrine 0.3 mg/0.3 mL IJ SOAJ injection Inject 0.3 mg into the muscle as directed. Inject into the muscle once as needed for anaphylaxis reaction    . Fexofenadine HCl (ALLERGY 24-HR PO) Take 180 mg by mouth daily.    . hydrOXYzine (ATARAX/VISTARIL) 25 MG tablet Take 25 mg by mouth 3 (three) times daily as needed for itching.    . labetalol (NORMODYNE) 200 MG tablet Take 1 tablet (200 mg total) by mouth 2 (two) times daily. 30 tablet 0  . Norgestimate-Eth Estradiol (MONONESSA PO) Take 1 tablet by mouth daily.      No current facility-administered medications on file prior to visit.     Allergies  Allergen Reactions  . Beta Adrenergic Blockers     Lethargic, increased tachycardia  . Cisapride Other (See Comments)    Prolonged QT syndrome  . Methylphenidate Hcl Other (See Comments)    Increased tachycardia  . Nitrofurantoin Nausea And Vomiting  . Propranolol Other (See Comments)    Lethargy and fatigue  . Sertraline Hcl  Diarrhea  . Sulfamethoxazole-Trimethoprim Nausea And Vomiting  . Amphetamine Palpitations  . Ciprofloxacin Hives and Rash  . Dextroamphetamine Palpitations  . Vancomycin Hives and Rash    Family History  Problem Relation Age of Onset  . Hypertension Father   . Irritable bowel syndrome Mother   . Hypertension Paternal Grandfather   . Lung cancer Maternal Grandfather   . Diabetes Maternal Grandmother   . Crohn's disease Maternal Grandmother   . Colon cancer Neg Hx   . Rectal cancer Neg Hx   . Stomach cancer Neg Hx   . Other Neg Hx     hypoparathyroidism    BP 122/78   Pulse 68   Ht 5\' 7"  (1.702 m)   Wt 141 lb (64 kg)   SpO2 97%   BMI 22.08 kg/m     Review of Systems Denies muscle cramps      Objective:   Physical Exam VITAL SIGNS:  See vs page.  GENERAL: no distress.  NECK: There is no palpable thyroid enlargement.  No thyroid nodule is palpable.  No palpable lymphadenopathy at the anterior neck. Ext: no edema.    Lab Results  Component Value Date   PTH 2 (L) 06/06/2016   CALCIUM 8.6 06/06/2016   CALCIUM 8.4 06/06/2016   CAION 0.89 (L) 12/16/2015   PHOS 4.5 06/06/2016       Assessment & Plan:  Hypoparathyroidism: well-controlled.  Please continue the same medication.

## 2016-06-06 NOTE — Patient Instructions (Signed)
blood tests are requested for you today.  We'll let you know about the results. Please come back for a follow-up appointment in 6 months In view of your medical condition, you should avoid pregnancy until we have decided it is safe.

## 2016-06-07 LAB — PTH, INTACT AND CALCIUM
CALCIUM: 8.6 mg/dL (ref 8.4–10.5)
PTH: 2 pg/mL — AB (ref 14–64)

## 2016-06-30 ENCOUNTER — Other Ambulatory Visit: Payer: Self-pay | Admitting: *Deleted

## 2016-06-30 MED ORDER — LABETALOL HCL 200 MG PO TABS: 200.0000 mg | ORAL_TABLET | Freq: Two times a day (BID) | ORAL | 1 refills | Status: DC

## 2016-06-30 NOTE — Addendum Note (Signed)
Addended by: Domenica Reamer R on: 06/30/2016 04:35 PM   Modules accepted: Orders

## 2016-07-27 ENCOUNTER — Encounter: Payer: Self-pay | Admitting: Endocrinology

## 2016-07-27 ENCOUNTER — Other Ambulatory Visit: Payer: Self-pay | Admitting: Endocrinology

## 2016-07-27 MED ORDER — CALCITRIOL 0.5 MCG PO CAPS: 1.0000 ug | ORAL_CAPSULE | Freq: Two times a day (BID) | ORAL | 3 refills | Status: DC

## 2016-08-14 ENCOUNTER — Encounter: Payer: Self-pay | Admitting: Internal Medicine

## 2016-09-01 ENCOUNTER — Ambulatory Visit: Payer: BLUE CROSS/BLUE SHIELD | Admitting: Internal Medicine

## 2016-09-28 ENCOUNTER — Encounter: Payer: Self-pay | Admitting: Internal Medicine

## 2016-10-18 ENCOUNTER — Ambulatory Visit (INDEPENDENT_AMBULATORY_CARE_PROVIDER_SITE_OTHER): Payer: BLUE CROSS/BLUE SHIELD | Admitting: Internal Medicine

## 2016-10-18 ENCOUNTER — Encounter: Payer: Self-pay | Admitting: Internal Medicine

## 2016-10-18 VITALS — BP 128/78 | HR 84 | Ht 67.0 in | Wt 139.8 lb

## 2016-10-18 DIAGNOSIS — G909 Disorder of the autonomic nervous system, unspecified: Secondary | ICD-10-CM

## 2016-10-18 DIAGNOSIS — G901 Familial dysautonomia [Riley-Day]: Secondary | ICD-10-CM

## 2016-10-18 NOTE — Progress Notes (Signed)
Patient Care Team: Celene Squibb, MD as PCP - General (Internal Medicine)   HPI  Alisha Copeland is a 31 y.o. female Seen in follow-up for POTS a diagnosis which she has carried for 15+ years.  She's been doing pretty well although she recently has been struggling with a sinus infection which is aggravated some of her symptoms.  She was able to complete all of her college work and she is going to walk in May  . Her allergist has expressed concerns about beta blockers in the context of her having required epinephrine for anaphylactic reactions.  Records and Results Reviewed   Past Medical History:  Diagnosis Date  . Asthma   . Chronic kidney disease    UTI's  . GERD (gastroesophageal reflux disease)   . Hypertension   . Hypoparathyroidism ([place])   . POTS (postural orthostatic tachycardia syndrome)     History reviewed. No pertinent surgical history.  Current Outpatient Prescriptions  Medication Sig Dispense Refill  . adapalene (DIFFERIN) 0.1 % gel Apply 1 application topically daily as needed. acne  0  . albuterol (PROVENTIL HFA;VENTOLIN HFA) 108 (90 BASE) MCG/ACT inhaler Inhale 2 puffs into the lungs every 6 (six) hours as needed for wheezing. 1 Inhaler 1  . ALPRAZolam (XANAX) 0.5 MG tablet Take 0.5 mg by mouth daily as needed for anxiety or sleep.     . calcitRIOL (ROCALTROL) 0.5 MCG capsule Take 2 capsules (1 mcg total) by mouth 2 (two) times daily. 180 capsule 3  . calcium carbonate (OS-CAL) 600 MG TABS Take 600-1,200 mg by mouth 2 (two) times daily with a meal. Take 1200mg  in the morning and 600mg  at night    . EPINEPHrine 0.3 mg/0.3 mL IJ SOAJ injection Inject 0.3 mg into the muscle as directed. Inject into the muscle once as needed for anaphylaxis reaction    . Fexofenadine HCl (ALLERGY 24-HR PO) Take 180 mg by mouth daily.    . hydrOXYzine (ATARAX/VISTARIL) 25 MG tablet Take 25 mg by mouth 3 (three) times daily as needed for itching.    . labetalol  (NORMODYNE) 200 MG tablet Take 200 mg by mouth daily.    Marland Kitchen MONONESSA 0.25-35 MG-MCG tablet Take 1 tablet by mouth daily.  10   No current facility-administered medications for this visit.     Allergies  Allergen Reactions  . Beta Adrenergic Blockers     Lethargic, increased tachycardia  . Cisapride Other (See Comments)    Prolonged QT syndrome  . Methylphenidate Hcl Other (See Comments)    Increased tachycardia  . Nitrofurantoin Nausea And Vomiting  . Propranolol Other (See Comments)    Lethargy and fatigue  . Sertraline Hcl Diarrhea  . Sulfamethoxazole-Trimethoprim Nausea And Vomiting  . Amphetamine Palpitations  . Ciprofloxacin Hives and Rash  . Dextroamphetamine Palpitations  . Vancomycin Hives and Rash      Review of Systems negative except from HPI and PMH  Physical Exam BP 128/78   Pulse 84   Ht 5\' 7"  (1.702 m)   Wt 139 lb 12.8 oz (63.4 kg)   SpO2 98%   BMI 21.90 kg/m  Well developed and well nourished in no acute distress HENT normal E scleral and icterus clear Neck Supple JVP flat; carotids brisk and full Clear to ausculation  *Regular rate and rhythm, no murmurs gallops or rub Soft with active bowel sounds No clubbing cyanosis  Edema Alert and oriented, grossly normal motor and sensory function Skin Warm and  Dry  ECG was not obtained today  Assessment and  Plan  Dysautonomia  Overall she's been doing quite well. She's been able to exercise.  We discussed the beta blockers and her epinephrine. For right now we will continue her on it.   We spent more than 50% of our >25 min visit in face to face counseling regarding the above       Current medicines are reviewed at length with the patient today .  The patient does not  have concerns regarding medicines.

## 2016-10-18 NOTE — Patient Instructions (Addendum)
Medication Instructions: - Your physician has recommended you make the following change in your medication:  1) Labetalol 200 mg- take 1/2 tablet (100 mg) by mouth twice daily  Labwork: - none ordered  Procedures/Testing: - none ordered  Follow-Up: - Your physician wants you to follow-up in: June 2018 with Dr. Caryl Comes. You will receive a reminder letter in the mail two months in advance. If you don't receive a letter, please call our office to schedule the follow-up appointment.  Any Additional Special Instructions Will Be Listed Below (If Applicable).     If you need a refill on your cardiac medications before your next appointment, please call your pharmacy.

## 2016-11-06 ENCOUNTER — Other Ambulatory Visit: Payer: Self-pay | Admitting: Internal Medicine

## 2016-12-22 ENCOUNTER — Telehealth: Payer: Self-pay | Admitting: Internal Medicine

## 2016-12-22 ENCOUNTER — Encounter: Payer: Self-pay | Admitting: Internal Medicine

## 2016-12-22 NOTE — Telephone Encounter (Signed)
Have you take labetolol 200 mg bid   [facility] will be 140  Increase as necessary   ? Clonidine of benefit

## 2016-12-22 NOTE — Telephone Encounter (Signed)
Patient walked in to the office today to schedule a follow up appointment with Dr. Caryl Comes. She reported she just had her blood pressure checked and it was 160/109. She reports that she has had similar readings over the last week- BP taken after she took her medications.  Patient with a history of dysautonomia. She saw Dr.Klein on 10/18/16 and was started on labetalol 200 mg- 1/2 tablet twice daily. I advised I would need to review with Dr. Caryl Comes and call her back.

## 2016-12-22 NOTE — Telephone Encounter (Signed)
Follow up   Pt is calling to follow up about her BP.

## 2016-12-25 NOTE — Telephone Encounter (Signed)
Pt reports Dr. Caryl Comes called her after hours Friday and advised to increase Labetalol to 200 mg BID. She reports not tolerating increase -- experiencing fatigue, weakness, wheezing, dizziness and near-syncope. Her BPs are unchanged, even w/ increased Labetalol.  She returned to original dosing of 100 mg BID last night secondary to S.E.. Reports BPs averaged 150s yesterday.  This morning she reports BP 162/107, HR 101 (she took while I was on the phone w/ her).  She has not taken her morning medications yet as she just woke up. She starts a new job Tuesday and does not want to feel like this.  She would like another medication to try to help w/ lowering her BP. She understands triage nurse will forward to Dr. Caryl Comes for his review, and office will call her once he advises.

## 2016-12-25 NOTE — Telephone Encounter (Signed)
We can try amlodipine 2.5

## 2016-12-26 MED ORDER — AMLODIPINE BESYLATE 2.5 MG PO TABS: 2.5000 mg | ORAL_TABLET | Freq: Every day | ORAL | 6 refills | Status: DC

## 2016-12-26 NOTE — Telephone Encounter (Signed)
Pt agreeable to trying Amlodipine.  She thinks she has tried this before but willing to give another try. Rx called into [facility]. Advised to call office if this does not help bring BP down and we can see about titrating it up for control.  She will also call if any new S.E. begin after starting new med. She thanks me for calling and helping with this.

## 2016-12-30 ENCOUNTER — Encounter: Payer: Self-pay | Admitting: Internal Medicine

## 2017-01-04 ENCOUNTER — Telehealth: Payer: Self-pay | Admitting: Internal Medicine

## 2017-01-04 NOTE — Telephone Encounter (Signed)
error 

## 2017-01-05 ENCOUNTER — Encounter (HOSPITAL_COMMUNITY): Payer: Self-pay | Admitting: Emergency Medicine

## 2017-01-05 ENCOUNTER — Ambulatory Visit (HOSPITAL_COMMUNITY)
Admission: EM | Admit: 2017-01-05 | Discharge: 2017-01-05 | Disposition: A | Discharge status: Home / Self Care | Payer: BLUE CROSS/BLUE SHIELD | Attending: Internal Medicine | Admitting: Internal Medicine

## 2017-01-05 DIAGNOSIS — R0981 Nasal congestion: Secondary | ICD-10-CM | POA: Diagnosis not present

## 2017-01-05 DIAGNOSIS — J111 Influenza due to unidentified influenza virus with other respiratory manifestations: Secondary | ICD-10-CM

## 2017-01-05 DIAGNOSIS — R51 Headache: Secondary | ICD-10-CM | POA: Diagnosis not present

## 2017-01-05 DIAGNOSIS — Z79899 Other long term (current) drug therapy: Secondary | ICD-10-CM | POA: Insufficient documentation

## 2017-01-05 DIAGNOSIS — R509 Fever, unspecified: Secondary | ICD-10-CM | POA: Diagnosis not present

## 2017-01-05 DIAGNOSIS — R69 Illness, unspecified: Secondary | ICD-10-CM

## 2017-01-05 LAB — POCT RAPID STREP A: STREPTOCOCCUS, GROUP A SCREEN (DIRECT): NEGATIVE

## 2017-01-05 MED ORDER — PREDNISONE 50 MG PO TABS: 50.0000 mg | ORAL_TABLET | Freq: Every day | ORAL | 0 refills | Status: DC

## 2017-01-05 MED ORDER — TRIAMCINOLONE ACETONIDE 55 MCG/ACT NA AERO: 2.0000 | INHALATION_SPRAY | Freq: Every day | NASAL | 0 refills | Status: DC

## 2017-01-05 NOTE — ED Triage Notes (Signed)
PT reports fever, sore throat, body aches, and headache since yesterday. PT has history of POTS and sees cardiology. PT reports she is always hypertensive.

## 2017-01-05 NOTE — ED Provider Notes (Signed)
[place]    CSN: [id number] Arrival date & time: 01/05/17  1448     History   Chief Complaint Chief Complaint  Patient presents with  . Sore Throat    HPI Alisha Copeland is a 31 y.o. female. She presents today with onset of fever to 100.8, headache, sore throat, yesterday.  Marked nasal congestion, some runny nose.  Not really coughing.  Achiness.  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  Had a flu shot.  Hx POTS, sees cardiologist.  Variable heart rate.  Not taking decongestant.  Had a bad reaction to tamiflu/ibuprofen/robitussin last year, anaphylactic reaction, saw allergist and trigger for event was unclear.  Afraid to take many meds.      HPI  Past Medical History:  Diagnosis Date  . Asthma   . Chronic kidney disease    UTI's  . GERD (gastroesophageal reflux disease)   . Hypertension   . Hypoparathyroidism ([place])   . POTS (postural orthostatic tachycardia syndrome)     Patient Active Problem List   Diagnosis Date Noted  . POTS (postural orthostatic tachycardia syndrome) 04/28/2015  . HYPERTENSION, BENIGN 02/14/2010  . DYSAUTONOMIA 02/14/2010  . DEPRESSIVE DISORDER NOT ELSEWHERE CLASSIFIED 12/06/2009  . HEADACHE 07/15/2008  . INFLUENZA WITH OTHER MANIFESTATIONS 07/14/2008  . POSTURAL HYPOTENSION, HX OF 07/14/2008  . HYPOPARATHYROIDISM 01/15/2007  . MIGRAINE HEADACHE 01/15/2007  . CYSTITIS, ACUTE 01/15/2007  . TACHYCARDIA 01/15/2007  . ALLERGIC DRUG REACTION, HX OF 01/15/2007    History reviewed. No pertinent surgical history.   Home Medications    Prior to Admission medications   Medication Sig Start Date End Date Taking? Authorizing Provider  calcitRIOL (ROCALTROL) 0.5 MCG capsule Take 2 capsules (1 mcg total) by mouth 2 (two) times daily. Patient taking differently: Take 0.5 mcg by mouth 2 (two) times daily.  07/27/16  Yes Renato Shin, MD  calcium carbonate (OS-CAL) 600 MG TABS Take 600-1,200 mg by  mouth 2 (two) times daily with a meal. Take 1200mg  in the morning and 600mg  at night   Yes Historical Provider, MD  adapalene (DIFFERIN) 0.1 % gel Apply 1 application topically daily as needed. acne 11/11/15   Historical Provider, MD  albuterol (PROVENTIL HFA;VENTOLIN HFA) 108 (90 BASE) MCG/ACT inhaler Inhale 2 puffs into the lungs every 6 (six) hours as needed for wheezing. 08/12/12   Janne Napoleon, NP  ALPRAZolam Duanne Moron) 0.5 MG tablet Take 0.5 mg by mouth daily as needed for anxiety or sleep.     Historical Provider, MD  amLODipine (NORVASC) 2.5 MG tablet Take 1 tablet (2.5 mg total) by mouth daily. 12/26/16   Deboraha Sprang, MD  EPINEPHrine 0.3 mg/0.3 mL IJ SOAJ injection Inject 0.3 mg into the muscle as directed. Inject into the muscle once as needed for anaphylaxis reaction    Historical Provider, MD  Fexofenadine HCl (ALLERGY 24-HR PO) Take 180 mg by mouth daily.    Historical Provider, MD  hydrOXYzine (ATARAX/VISTARIL) 25 MG tablet Take 25 mg by mouth 3 (three) times daily as needed for itching.    Historical Provider, MD  labetalol (NORMODYNE) 200 MG tablet Take 0.5 tablet (100 mg) by mouth twice daily    Historical Provider, MD  labetalol (NORMODYNE) 200 MG tablet Take 0.5 tablets (100 mg total) by mouth 2 (two) times daily. 11/07/16   Deboraha Sprang, MD  MONONESSA 0.25-35 MG-MCG tablet Take 1 tablet by mouth daily. 09/28/16   Historical Provider, MD  predniSONE (DELTASONE)  50 MG tablet Take 1 tablet (50 mg total) by mouth daily. 01/05/17   Sherlene Shams, MD  triamcinolone (NASACORT AQ) 55 MCG/ACT AERO nasal inhaler Place 2 sprays into the nose daily. 01/05/17   Sherlene Shams, MD    Family History Family History  Problem Relation Age of Onset  . Hypertension Father   . Irritable bowel syndrome Mother   . Hypertension Paternal Grandfather   . Lung cancer Maternal Grandfather   . Diabetes Maternal Grandmother   . Crohn's disease Maternal Grandmother   . Colon cancer Neg Hx   . Rectal cancer  Neg Hx   . Stomach cancer Neg Hx   . Other Neg Hx     hypoparathyroidism    Social History Social History  Substance Use Topics  . Smoking status: Never Smoker  . Smokeless tobacco: Never Used  . Alcohol use Yes     Comment: occassionaly     Allergies   Beta adrenergic blockers; Cisapride; Methylphenidate hcl; Nitrofurantoin; Propranolol; Sertraline hcl; Sulfamethoxazole-trimethoprim; Amphetamine; Ciprofloxacin; Dextroamphetamine; and Vancomycin   Review of Systems Review of Systems  All other systems reviewed and are negative.    Physical Exam Triage Vital Signs ED Triage Vitals  Enc Vitals Group     BP 01/05/17 1505 (!) 203/115     Pulse Rate 01/05/17 1505 (!) 113     Resp 01/05/17 1505 16     Temp 01/05/17 1505 (!) 100.5 F (38.1 C)     Temp Source 01/05/17 1505 Oral     SpO2 01/05/17 1505 98 %     Weight 01/05/17 1505 134 lb (60.8 kg)     Height 01/05/17 1505 5\' 7"  (1.702 m)     Pain Score 01/05/17 1507 5     Pain Loc --    Updated Vital Signs BP (!) 154/112   Pulse (!) 113   Temp (!) 100.5 F (38.1 C) (Oral)   Resp 16   Ht 5\' 7"  (1.702 m)   Wt 134 lb (60.8 kg)   LMP 12/22/2016   SpO2 98%   BMI 20.99 kg/m   Physical Exam  Constitutional: She is oriented to person, place, and time. No distress.  Alert, nicely groomed Sounds very congested  HENT:  Head: Atraumatic.  B TMs translucent, R TM flushed slightly pink. Moderate nasal congestion Throat injected, post nasal drainage evident  Eyes:  Conjugate gaze, slight conjunctival injection bilat, no discharge  Neck: Neck supple.  Cardiovascular: Regular rhythm.   HR 110s  Pulmonary/Chest: No respiratory distress. She has no wheezes. She has no rales.  Lungs clear, symmetric breath sounds  Abdominal: She exhibits no distension.  Musculoskeletal: Normal range of motion.  No leg swelling  Neurological: She is alert and oriented to person, place, and time.  Skin: Skin is warm and dry.  No cyanosis    Nursing note and vitals reviewed.    UC Treatments / Results   Procedures Procedures (including critical care time) None today  Final Clinical Impressions(s) / UC Diagnoses   Final diagnoses:  Influenza-like illness   Rest and push fluids.  Recheck for persistent (>3-4 more days) fever >100.5, increasing phlegm production/nasal discharge, or if not starting to improve in a few days.  Note for work, return to work on 01/09/17.  Prescriptions for nasal steroid spray and prednisone sent to the [facility] [address].   New Prescriptions New Prescriptions   PREDNISONE (DELTASONE) 50 MG TABLET    Take 1 tablet (50 mg  total) by mouth daily.   TRIAMCINOLONE (NASACORT AQ) 55 MCG/ACT AERO NASAL INHALER    Place 2 sprays into the nose daily.     Sherlene Shams, MD 01/07/17 2212

## 2017-01-05 NOTE — ED Notes (Signed)
PT took tylenol at 0800

## 2017-01-05 NOTE — Discharge Instructions (Addendum)
Rest and push fluids.  Recheck for persistent (>3-4 more days) fever >100.5, increasing phlegm production/nasal discharge, or if not starting to improve in a few days.  Note for work, return to work on 01/09/17.  Prescriptions for nasal steroid spray and prednisone sent to the [facility] [address].

## 2017-01-08 LAB — CULTURE, GROUP A STREP (THRC)

## 2017-01-10 ENCOUNTER — Encounter: Payer: Self-pay | Admitting: Endocrinology

## 2017-01-10 ENCOUNTER — Ambulatory Visit (INDEPENDENT_AMBULATORY_CARE_PROVIDER_SITE_OTHER): Payer: BLUE CROSS/BLUE SHIELD | Admitting: Endocrinology

## 2017-01-10 VITALS — BP 138/86 | HR 94 | Ht 67.0 in | Wt 134.0 lb

## 2017-01-10 DIAGNOSIS — E209 Hypoparathyroidism, unspecified: Secondary | ICD-10-CM

## 2017-01-10 DIAGNOSIS — R739 Hyperglycemia, unspecified: Secondary | ICD-10-CM

## 2017-01-10 NOTE — Progress Notes (Signed)
Subjective:    Patient ID: Alisha Copeland, female    DOB: 09-13-1986, 31 y.o.   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  she is not considering a pregnancy now.  She has intermittent leg cramps.  She takes rocaltrol 0.5 mcg bid.  She was recently ill with abd pain.  During this, she had HTN, but she feels better now.   HTN: she has mild doe.  Past Medical History:  Diagnosis Date  . Asthma   . Chronic kidney disease    UTI's  . GERD (gastroesophageal reflux disease)   . Hypertension   . Hypoparathyroidism ([place])   . POTS (postural orthostatic tachycardia syndrome)     No past surgical history on file.  Social History   Social History  . Marital status: Married    Spouse name: N/A  . Number of children: N/A  . Years of education: N/A   Occupational History  . Not on file.   Social History Main Topics  . Smoking status: Never Smoker  . Smokeless tobacco: Never Used  . Alcohol use Yes     Comment: occassionaly  . Drug use: No  . Sexual activity: Yes    Birth control/ protection: Pill   Other Topics Concern  . Not on file   Social History Narrative  . No narrative on file    Current Outpatient Prescriptions on File Prior to Visit  Medication Sig Dispense Refill  . adapalene (DIFFERIN) 0.1 % gel Apply 1 application topically daily as needed. acne  0  . albuterol (PROVENTIL HFA;VENTOLIN HFA) 108 (90 BASE) MCG/ACT inhaler Inhale 2 puffs into the lungs every 6 (six) hours as  needed for wheezing. 1 Inhaler 1  . ALPRAZolam (XANAX) 0.5 MG tablet Take 0.5 mg by mouth daily as needed for anxiety or sleep.     . calcitRIOL (ROCALTROL) 0.5 MCG capsule Take 2 capsules (1 mcg total) by mouth 2 (two) times daily. (Patient taking differently: Take 0.5 mcg by mouth 2 (two) times daily. ) 180 capsule 3  . calcium carbonate (OS-CAL) 600 MG TABS Take 600-1,200 mg by mouth 2 (two) times daily with a meal. Take 1200mg  in the morning and 600mg  at night    . EPINEPHrine 0.3 mg/0.3 mL IJ SOAJ injection Inject 0.3 mg into the muscle as directed. Inject into the muscle once as needed for anaphylaxis reaction    . Fexofenadine HCl (ALLERGY 24-HR PO) Take 180 mg by mouth daily.    . hydrOXYzine (ATARAX/VISTARIL) 25 MG tablet Take 25 mg by mouth 3 (three) times daily as needed for itching.    . labetalol (NORMODYNE) 200 MG tablet Take 0.5 tablets (100 mg total) by mouth 2 (two) times daily. 30 tablet 11  . MONONESSA 0.25-35 MG-MCG tablet Take 1 tablet by mouth daily.  10   No current facility-administered medications on file prior to visit.     Allergies  Allergen Reactions  . Beta Adrenergic Blockers     Lethargic, increased tachycardia  . Cisapride Other (See  Comments)    Prolonged QT syndrome  . Methylphenidate Hcl Other (See Comments)    Increased tachycardia  . Nitrofurantoin Nausea And Vomiting  . Propranolol Other (See Comments)    Lethargy and fatigue  . Sertraline Hcl Diarrhea  . Sulfamethoxazole-Trimethoprim Nausea And Vomiting  . Amphetamine Palpitations  . Ciprofloxacin Hives and Rash  . Dextroamphetamine Palpitations  . Vancomycin Hives and Rash    Family History  Problem Relation Age of Onset  . Hypertension Father   . Irritable bowel syndrome Mother   . Hypertension Paternal Grandfather   . Lung cancer Maternal Grandfather   . Diabetes Maternal Grandmother   . Crohn's disease Maternal Grandmother   . Colon cancer Neg Hx   . Rectal cancer Neg Hx   .  Stomach cancer Neg Hx   . Other Neg Hx     hypoparathyroidism    BP 138/86   Pulse 94   Ht 5\' 7"  (1.702 m)   Wt 134 lb (60.8 kg)   LMP 12/22/2016   SpO2 97%   BMI 20.99 kg/m   Review of Systems Denies n/v.  She has intermittent numbness of the feet.      Objective:   Physical Exam VITAL SIGNS:  See vs page GENERAL: no distress NECK: There is no palpable thyroid enlargement.  No thyroid nodule is palpable.  No palpable lymphadenopathy at the anterior neck.  LUNGS:  Clear to auscultation HEART:  Regular rate and rhythm without murmurs noted. Normal S1,S2.   Ext: no edema.  Lab Results  Component Value Date   PTH 3 (L) 01/10/2017   CALCIUM 8.4 (L) 01/10/2017   CALCIUM 8.4 (L) 01/10/2017   CAION 0.89 (L) 12/16/2015   PHOS 4.0 01/10/2017   Random glucose=126    Assessment & Plan:  HTN: well-controlled: Please continue the same medication.  Hypoparathyroidism: this is the best control this pt should aim for, given she declines natpara.  Hyperglycemia: new, but mild.  She has increased risk for type 1 DM, so we'll follow.   Patient Instructions  blood tests are requested for you today.  We'll let you know about the results.  Let's check the ultrasound.  you will receive a phone call, about a day and time for an appointment.   Please come back for a follow-up appointment in 6 months.  In view of your medical condition, you should avoid pregnancy until we have decided it is safe.   Please continue the same medication for blood pressure.

## 2017-01-10 NOTE — Patient Instructions (Addendum)
blood tests are requested for you today.  We'll let you know about the results.  Let's check the ultrasound.  you will receive a phone call, about a day and time for an appointment.   Please come back for a follow-up appointment in 6 months.  In view of your medical condition, you should avoid pregnancy until we have decided it is safe.   Please continue the same medication for blood pressure.

## 2017-01-11 LAB — PHOSPHORUS: PHOSPHORUS: 4 mg/dL (ref 2.5–4.5)

## 2017-01-11 LAB — BASIC METABOLIC PANEL
BUN: 24 mg/dL (ref 7–25)
CO2: 26 mmol/L (ref 20–31)
Calcium: 8.4 mg/dL — ABNORMAL LOW (ref 8.6–10.2)
Chloride: 99 mmol/L (ref 98–110)
Creat: 0.73 mg/dL (ref 0.50–1.10)
Glucose, Bld: 126 mg/dL — ABNORMAL HIGH (ref 65–99)
POTASSIUM: 3.6 mmol/L (ref 3.5–5.3)
Sodium: 138 mmol/L (ref 135–146)

## 2017-01-11 LAB — PTH, INTACT AND CALCIUM
Calcium: 8.4 mg/dL — ABNORMAL LOW (ref 8.6–10.2)
PTH: 3 pg/mL — AB (ref 14–64)

## 2017-01-11 LAB — MAGNESIUM: Magnesium: 1.9 mg/dL (ref 1.5–2.5)

## 2017-01-12 DIAGNOSIS — R739 Hyperglycemia, unspecified: Secondary | ICD-10-CM | POA: Insufficient documentation

## 2017-01-25 ENCOUNTER — Ambulatory Visit
Admission: RE | Admit: 2017-01-25 | Discharge: 2017-01-25 | Disposition: A | Discharge status: Home / Self Care | Payer: BC Managed Care – PPO | Source: Ambulatory Visit | Attending: Endocrinology | Admitting: Endocrinology

## 2017-01-25 DIAGNOSIS — E209 Hypoparathyroidism, unspecified: Secondary | ICD-10-CM

## 2017-01-26 HISTORY — PX: US KIDNEY BILATERAL (ARMC HX): HXRAD1406

## 2017-03-28 ENCOUNTER — Ambulatory Visit (INDEPENDENT_AMBULATORY_CARE_PROVIDER_SITE_OTHER): Payer: BC Managed Care – PPO | Admitting: Internal Medicine

## 2017-03-28 VITALS — BP 174/110 | HR 81 | Ht 67.0 in | Wt 154.0 lb

## 2017-03-28 DIAGNOSIS — G909 Disorder of the autonomic nervous system, unspecified: Secondary | ICD-10-CM

## 2017-03-28 DIAGNOSIS — I10 Essential (primary) hypertension: Secondary | ICD-10-CM | POA: Diagnosis not present

## 2017-03-28 DIAGNOSIS — G901 Familial dysautonomia [Riley-Day]: Secondary | ICD-10-CM

## 2017-03-28 MED ORDER — CLONIDINE HCL 0.2 MG/24HR TD PTWK: 0.2000 mg | MEDICATED_PATCH | TRANSDERMAL | 6 refills | Status: DC

## 2017-03-28 MED ORDER — CARVEDILOL 6.25 MG PO TABS: 6.2500 mg | ORAL_TABLET | Freq: Two times a day (BID) | ORAL | 6 refills | Status: DC

## 2017-03-28 NOTE — Patient Instructions (Addendum)
Medication Instructions: - Your physician has recommended you make the following change in your medication:  1) Stop labetolol 2) Start coreg (carvedilol) 6.25 mg- take one tablet by mouth twice daily 3) Start clonidine TTS 2 patch- apply one patch onto skin once a week  Labwork: - none ordered  Procedures/Testing: - Your physician has requested that you have a renal artery duplex. During this test, an ultrasound is used to evaluate blood flow to the kidneys. Allow one hour for this exam. Do not eat after midnight the day before and avoid carbonated beverages. Take your medications as you usually do.  Follow-Up: - Your physician recommends that you schedule a follow-up appointment in: 4 months with Dr. Caryl Comes.   Any Additional Special Instructions Will Be Listed Below (If Applicable).     If you need a refill on your cardiac medications before your next appointment, please call your pharmacy.

## 2017-03-28 NOTE — Progress Notes (Signed)
Patient Care Team: Celene Squibb, MD as PCP - General (Internal Medicine)   HPI  Alisha Copeland is a 31 y.o. female Seen in follow-up for POTS a diagnosis which she has carried for 15+ years. Evaluation at [facility] included a tilt table test where she was identified as having hyper adrenergic response She is also struggled with hypertension.  She's been on beta blockers and clonidine. More recently her blood pressures have been in the 200 range on occasion more commonly in the 170s. She does not recall an evaluation for secondary hypertension.  She does have hypoparathyroidism-- She did have a renal ultrasound recently that demonstrated normal kidney size.  She has had less dizziness--some HA and leg pain Exercising well Fluid replete But cutting down on sodium  Records and Results Reviewed   Past Medical History:  Diagnosis Date  . Asthma   . Chronic kidney disease    UTI's  . GERD (gastroesophageal reflux disease)   . Hypertension   . Hypoparathyroidism ([place])   . POTS (postural orthostatic tachycardia syndrome)     No past surgical history on file.  Current Outpatient Prescriptions  Medication Sig Dispense Refill  . adapalene (DIFFERIN) 0.1 % gel Apply 1 application topically daily as needed. acne  0  . albuterol (PROVENTIL HFA;VENTOLIN HFA) 108 (90 BASE) MCG/ACT inhaler Inhale 2 puffs into the lungs every 6 (six) hours as needed for wheezing. 1 Inhaler 1  . ALPRAZolam (XANAX) 0.5 MG tablet Take 0.5 mg by mouth daily as needed for anxiety or sleep.     . calcitRIOL (ROCALTROL) 0.5 MCG capsule Take 2 capsules (1 mcg total) by mouth 2 (two) times daily. (Patient taking differently: Take 0.5 mcg by mouth 2 (two) times daily. ) 180 capsule 3  . calcium carbonate (OS-CAL) 600 MG TABS Take 600-1,200 mg by mouth 2 (two) times daily with a meal. Take 1200mg  in the morning and 600mg  at night    . EPINEPHrine 0.3 mg/0.3 mL IJ SOAJ injection Inject 0.3 mg into the muscle as  directed. Inject into the muscle once as needed for anaphylaxis reaction    . Fexofenadine HCl (ALLERGY 24-HR PO) Take 180 mg by mouth daily.    . hydrOXYzine (ATARAX/VISTARIL) 25 MG tablet Take 25 mg by mouth 3 (three) times daily as needed for itching.    . labetalol (NORMODYNE) 200 MG tablet Take 200 mg by mouth daily.    Marland Kitchen MONONESSA 0.25-35 MG-MCG tablet Take 1 tablet by mouth daily.  10   No current facility-administered medications for this visit.     Allergies  Allergen Reactions  . Beta Adrenergic Blockers     Lethargic, increased tachycardia  . Cisapride Other (See Comments)    Prolonged QT syndrome  . Methylphenidate Hcl Other (See Comments)    Increased tachycardia  . Nitrofurantoin Nausea And Vomiting  . Propranolol Other (See Comments)    Lethargy and fatigue  . Sertraline Hcl Diarrhea  . Sulfamethoxazole-Trimethoprim Nausea And Vomiting  . Amphetamine Palpitations  . Ciprofloxacin Hives and Rash  . Dextroamphetamine Palpitations  . Vancomycin Hives and Rash      Review of Systems negative except from HPI and PMH  Physical Exam BP (!) 174/110   Pulse 81   Ht 5\' 7"  (1.702 m)   Wt 154 lb (69.9 kg)   SpO2 98%   BMI 24.12 kg/m  Well developed and nourished in no acute distress HENT normal Neck supple with JVP-flat Clear Regular  rate and rhythm, no murmurs or gallops Abd-soft with active BS No Clubbing cyanosis edema Skin-warm and dry A & Oriented  Grossly normal sensory and motor function   ECG sinus 81 15/08/39  Assessment and  Plan  Dysautonomia  Hypertension  Hypoparathyroidism  The patient has significant hypertension. I wonder as to whether it secondary. We will check renal vascular ultrasound. In addition, she is not tolerating the labetalol because of fatigue. We will defer back on carvedilol and had some fatigue. We will also try and add low-dose clonidine to see if we can get her blood pressures in the 1:30 range.  She is on a great  job of withholding salt.  More than 50% of 25  min was spent in counseling related to the above          Current medicines are reviewed at length with the patient today .  The patient does not  have concerns regarding medicines.

## 2017-05-03 ENCOUNTER — Ambulatory Visit (HOSPITAL_COMMUNITY)
Admission: RE | Admit: 2017-05-03 | Discharge: 2017-05-03 | Disposition: A | Discharge status: Home / Self Care | Payer: BC Managed Care – PPO | Source: Ambulatory Visit | Attending: Cardiology | Admitting: Cardiology

## 2017-05-03 DIAGNOSIS — I10 Essential (primary) hypertension: Secondary | ICD-10-CM | POA: Diagnosis not present

## 2017-05-03 DIAGNOSIS — I700 Atherosclerosis of aorta: Secondary | ICD-10-CM | POA: Diagnosis not present

## 2017-05-03 DIAGNOSIS — K551 Chronic vascular disorders of intestine: Secondary | ICD-10-CM | POA: Diagnosis not present

## 2017-05-03 DIAGNOSIS — I774 Celiac artery compression syndrome: Secondary | ICD-10-CM | POA: Diagnosis not present

## 2017-05-24 ENCOUNTER — Telehealth: Payer: Self-pay | Admitting: Internal Medicine

## 2017-05-24 DIAGNOSIS — I10 Essential (primary) hypertension: Secondary | ICD-10-CM

## 2017-05-24 MED ORDER — LABETALOL HCL 200 MG PO TABS: 200.0000 mg | ORAL_TABLET | Freq: Two times a day (BID) | ORAL | Status: DC

## 2017-05-24 NOTE — Telephone Encounter (Signed)
New message    Pt c/o medication issue:  1. Name of Medication:   cloNIDine (CATAPRES - DOSED IN MG/24 HR) 0.2 mg/24hr patch Place 1 patch (0.2 mg total) onto the skin once a week.   carvedilol (COREG) 6.25 MG tablet Take 1 tablet (6.25 mg total) by mouth 2 (two) times daily.     2. How are you currently taking this medication (dosage and times per day)?  As prescribed   3. Are you having a reaction (difficulty breathing--STAT)? Yes , has rash all over arms and legs neck , face , chest   4. What is your medication issue?  She has already taken off the patch, she started having a reaction in July and she has been on Prednisone and it went a way and came back worse

## 2017-05-24 NOTE — Telephone Encounter (Signed)
Called and spoke with patient. We'll have her stop carvedilol and clonidine. She has labetalol at home and will begin at 200 mg twice a day. She will let us know next week She's feeling and how her blood pressure is. Most recently blood pressures at home have been in the 140/100 range. Has she ever seen nephrology?  I called her back and she said no. I will talk with one of the nephrologists.

## 2017-05-24 NOTE — Telephone Encounter (Signed)
Spoke with the patient- she was started on coreg and clonidine patch around 03/28/17- she states she developed a rash around early July which looked like bug bites.  She went to her PCP and was started on prednisone as they did not know what this was- she did see some improvement with prednisone, but her last dose was 05/18/17. She has started to notice this is coming back on her face, neck, upper extremities, and chest area. She denies any difficulty swallowing.  She saw dermatology recently and they suggested this was a medication reaction. She took her clonidine patch off this morning, but did take coreg today. I advised her with starting 2 meds at the same time, it is hard to tell which may be causing the reaction for her.  I informed her I will review with Dr. Caryl Comes today and call her back, but we may need to keep her off the clonidine through the weekend to see if she starts to notice some improvement. She is agreeable.

## 2017-05-28 ENCOUNTER — Encounter: Payer: Self-pay | Admitting: Internal Medicine

## 2017-07-13 ENCOUNTER — Ambulatory Visit (INDEPENDENT_AMBULATORY_CARE_PROVIDER_SITE_OTHER): Payer: BC Managed Care – PPO | Admitting: Endocrinology

## 2017-07-13 DIAGNOSIS — E209 Hypoparathyroidism, unspecified: Secondary | ICD-10-CM | POA: Diagnosis not present

## 2017-07-13 NOTE — Progress Notes (Signed)
Subjective:    Patient ID: Alisha Copeland, female    DOB: [birth year], 31 y.o.   MRN: [id number]  HPI Pt returns for f/u of primary hypoparathyroidism (dx'ed at age 48; she has been on rocaltrol since then; at age 44, pt says she was hospitalized in ICU for ARF, due to overtreatment/hypercalcemia; she has no h/o adrenal dz, infertility, vitiligo, urolithiasis, ceilac disease, yeast infections, pernicious anemia, or liver disease; she is considering a pregnancy at some point, but hesitates, due this condition; pt says she had genetic testing many years ago, but no hereditary syndrome was found; she declines Natpara).  she is not considering a pregnancy now.  She has slight intermittent leg cramps, but no assoc numbness.  She takes rocaltrol 0.5 mcg bid.   Past Medical History:  Diagnosis Date  . Asthma   . Chronic kidney disease    UTI's  . GERD (gastroesophageal reflux disease)   . Hypertension   . Hypoparathyroidism ([place])   . POTS (postural orthostatic tachycardia syndrome)     No past surgical history on file.  Social History   Social History  . Marital status: Married    Spouse name: N/A  . Number of children: N/A  . Years of education: N/A   Occupational History  . Not on file.   Social History Main Topics  . Smoking status: Never Smoker  . Smokeless tobacco: Never Used  . Alcohol use Yes     Comment: occassionaly  . Drug use: No  . Sexual activity: Yes    Birth control/ protection: Pill   Other Topics Concern  . Not on file   Social History Narrative  . No narrative on file    Current Outpatient Prescriptions on File Prior to Visit  Medication Sig Dispense Refill  . adapalene (DIFFERIN) 0.1 % gel Apply 1 application topically daily as needed. acne  0  . albuterol (PROVENTIL HFA;VENTOLIN HFA) 108 (90 BASE) MCG/ACT inhaler Inhale 2 puffs into the lungs every 6 (six) hours as needed for wheezing. 1 Inhaler 1  . ALPRAZolam (XANAX) 0.5 MG tablet Take 0.5 mg by  mouth daily as needed for anxiety or sleep.     . calcitRIOL (ROCALTROL) 0.5 MCG capsule Take 2 capsules (1 mcg total) by mouth 2 (two) times daily. (Patient taking differently: Take 0.5 mcg by mouth 2 (two) times daily. ) 180 capsule 3  . calcium carbonate (OS-CAL) 600 MG TABS Take 600-1,200 mg by mouth 2 (two) times daily with a meal. Take 1200mg  in the morning and 600mg  at night    . EPINEPHrine 0.3 mg/0.3 mL IJ SOAJ injection Inject 0.3 mg into the muscle as directed. Inject into the muscle once as needed for anaphylaxis reaction    . Fexofenadine HCl (ALLERGY 24-HR PO) Take 180 mg by mouth daily.    . hydrOXYzine (ATARAX/VISTARIL) 25 MG tablet Take 25 mg by mouth 3 (three) times daily as needed for itching.    . labetalol (NORMODYNE) 200 MG tablet Take 1 tablet (200 mg total) by mouth 2 (two) times daily.    Marland Kitchen MONONESSA 0.25-35 MG-MCG tablet Take 1 tablet by mouth daily.  10   No current facility-administered medications on file prior to visit.     Allergies  Allergen Reactions  . Beta Adrenergic Blockers     Lethargic, increased tachycardia  . Cisapride Other (See Comments)    Prolonged QT syndrome  . Methylphenidate Hcl Other (See Comments)    Increased tachycardia  .  Nitrofurantoin Nausea And Vomiting  . Propranolol Other (See Comments)    Lethargy and fatigue  . Sertraline Hcl Diarrhea  . Sulfamethoxazole-Trimethoprim Nausea And Vomiting  . Amphetamine Palpitations  . Ciprofloxacin Hives and Rash  . Dextroamphetamine Palpitations  . Vancomycin Hives and Rash    Family History  Problem Relation Age of Onset  . Hypertension Father   . Irritable bowel syndrome Mother   . Hypertension Paternal Grandfather   . Lung cancer Maternal Grandfather   . Diabetes Maternal Grandmother   . Crohn's disease Maternal Grandmother   . Colon cancer Neg Hx   . Rectal cancer Neg Hx   . Stomach cancer Neg Hx   . Other Neg Hx        hypoparathyroidism    There were no vitals taken for  this visit.   Review of Systems She has weight gain    Objective:   Physical Exam GENERAL: no distress NECK: There is no palpable thyroid enlargement.  No thyroid nodule is palpable.  No palpable lymphadenopathy at the anterior neck.      Assessment & Plan:  Hypoparathyroidism: due for recheck HTN: not well-controlled POTS: we can't increase BP meds  Patient Instructions  blood tests are requested for you today.  We'll let you know about the results.  Please come back for a follow-up appointment in 6 months.  In view of your medical condition, you should avoid pregnancy until we have decided it is safe.   Please continue the same medication for blood pressure, and see Dr Caryl Comes follow.  Please let know if you want to take "Natpara."

## 2017-07-13 NOTE — Patient Instructions (Addendum)
blood tests are requested for you today.  We'll let you know about the results.  Please come back for a follow-up appointment in 6 months.  In view of your medical condition, you should avoid pregnancy until we have decided it is safe.   Please continue the same medication for blood pressure, and see Dr Caryl Comes follow.  Please let know if you want to take "Natpara."

## 2017-07-14 LAB — BASIC METABOLIC PANEL
BUN: 17 mg/dL (ref 7–25)
CALCIUM: 9.2 mg/dL (ref 8.6–10.2)
CO2: 27 mmol/L (ref 20–32)
Chloride: 99 mmol/L (ref 98–110)
Creat: 0.8 mg/dL (ref 0.50–1.10)
Glucose, Bld: 81 mg/dL (ref 65–99)
POTASSIUM: 3.8 mmol/L (ref 3.5–5.3)
SODIUM: 137 mmol/L (ref 135–146)

## 2017-07-14 LAB — T4, FREE: Free T4: 0.9 ng/dL (ref 0.8–1.8)

## 2017-07-14 LAB — VITAMIN D 25 HYDROXY (VIT D DEFICIENCY, FRACTURES): VIT D 25 HYDROXY: 34 ng/mL (ref 30–100)

## 2017-07-14 LAB — TSH: TSH: 1.14 mIU/L

## 2017-07-15 ENCOUNTER — Encounter: Payer: Self-pay | Admitting: Internal Medicine

## 2017-07-17 NOTE — Telephone Encounter (Signed)
Per patient advise request:  Alisha Sprang, MD  to [name]       9:45 AM  I have spoken to Dr Hollie Salk, a nephologist about you to make sure we have thought about your blood pressure well  She asked [place] to order a blood test in anticipation of seeing her ( her office will call) and also agreed taht we should get an echo to look at secondary effects on your heart  Thanks  [place] read by [name] at 10:38 AM on 07/17/2017.    Echo & renin-aldosterone level ordered and sent to scheduling to contact the patient.  Dx- hypertension.

## 2017-07-24 ENCOUNTER — Ambulatory Visit (HOSPITAL_COMMUNITY): Payer: BC Managed Care – PPO | Attending: Internal Medicine

## 2017-07-24 ENCOUNTER — Other Ambulatory Visit: Payer: Self-pay

## 2017-07-24 ENCOUNTER — Other Ambulatory Visit: Payer: BC Managed Care – PPO

## 2017-07-24 DIAGNOSIS — I10 Essential (primary) hypertension: Secondary | ICD-10-CM

## 2017-07-24 HISTORY — PX: TRANSTHORACIC ECHOCARDIOGRAM: SHX275

## 2017-07-27 LAB — ALDOSTERONE + RENIN ACTIVITY W/ RATIO
ALDOS/RENIN RATIO: 0.8 (ref 0.0–30.0)
ALDOSTERONE: 3.2 ng/dL (ref 0.0–30.0)
RENIN: 4.172 ng/mL/h (ref 0.167–5.380)

## 2017-07-30 ENCOUNTER — Telehealth: Payer: Self-pay

## 2017-07-30 DIAGNOSIS — I10 Essential (primary) hypertension: Secondary | ICD-10-CM

## 2017-07-30 NOTE — Telephone Encounter (Signed)
Pt is aware and agreeable to results. Sent referral to Dr. Hollie Salk at [facility] because no one form their office had contacted her yet and when I called to check on status of referral they informed me they had no record of patient needing to see Dr. Hollie Salk. We are both agreeable.

## 2017-07-30 NOTE — Telephone Encounter (Signed)
lmtcb for lab results

## 2017-07-30 NOTE — Telephone Encounter (Signed)
Called Dr. Bishop [place] office to check on status of referral for patient per Dr. Caryl Comes. [facility] informed me that they had not received any referral and asked me to send one so they could contact patient and arrange appointment for patient. I am agreeable to sending Epic Referral.

## 2017-09-03 ENCOUNTER — Encounter: Payer: Self-pay | Admitting: Internal Medicine

## 2017-09-13 ENCOUNTER — Ambulatory Visit: Payer: BC Managed Care – PPO | Admitting: Internal Medicine

## 2017-09-13 ENCOUNTER — Encounter: Payer: Self-pay | Admitting: Internal Medicine

## 2017-09-13 VITALS — BP 157/105 | HR 80 | Ht 67.0 in | Wt 164.8 lb

## 2017-09-13 DIAGNOSIS — I10 Essential (primary) hypertension: Secondary | ICD-10-CM | POA: Diagnosis not present

## 2017-09-13 DIAGNOSIS — G901 Familial dysautonomia [Riley-Day]: Secondary | ICD-10-CM

## 2017-09-13 NOTE — Patient Instructions (Signed)
Medication Instructions:  Your physician recommends that you continue on your current medications as directed. Please refer to the Current Medication list given to you today.  Please call back to let us know which of the four (4) medications (losartan 50 mg, lisinopril 5 mg, diltiazem 120 mg, and spironolactone 25 mg) worked best for you.   Labwork: None ordered  Testing/Procedures: None ordered  Follow-Up: Your physician recommends that you schedule a follow-up appointment in May or June 2019.   Any Other Special Instructions Will Be Listed Below (If Applicable).     If you need a refill on your cardiac medications before your next appointment, please call your pharmacy.

## 2017-09-13 NOTE — Progress Notes (Signed)
Patient Care Team: Celene Squibb, MD as PCP - General (Internal Medicine)   HPI  Alisha Copeland is a 31 y.o. female Seen in follow-up for POTS a diagnosis which she has carried for 15+ years. Evaluation at [facility] included a tilt table test where she was identified as having hyper adrenergic response She is also struggled with hypertension.  She's been on beta blockers and clonidine. More recently her blood pressures have been in the 200 range on occasion more commonly in the 170s. She does not recall an evaluation for secondary hypertension.  She does have hypoparathyroidism-- She did have a renal ultrasound recently that demonstrated normal kidney size.  She has had less dizziness--some HA and leg pain Exercising well Fluid replete Been cutting down on sodium  Has struggled with BP   Has not tolerated labetolol and clonidine  metoprolol propranolol carvedilol amlodipine verapamil   Past Medical History:  Diagnosis Date  . Asthma   . Chronic kidney disease    UTI's  . GERD (gastroesophageal reflux disease)   . Hypertension   . Hypoparathyroidism ([place])   . POTS (postural orthostatic tachycardia syndrome)     No past surgical history on file.  Current Outpatient Medications  Medication Sig Dispense Refill  . adapalene (DIFFERIN) 0.1 % gel Apply 1 application topically daily as needed. acne  0  . albuterol (PROVENTIL HFA;VENTOLIN HFA) 108 (90 BASE) MCG/ACT inhaler Inhale 2 puffs into the lungs every 6 (six) hours as needed for wheezing. 1 Inhaler 1  . calcitRIOL (ROCALTROL) 0.5 MCG capsule Take 2 capsules (1 mcg total) by mouth 2 (two) times daily. (Patient taking differently: Take 0.5 mcg by mouth 2 (two) times daily. ) 180 capsule 3  . calcium carbonate (OS-CAL) 600 MG TABS Take 600-1,200 mg by mouth 2 (two) times daily with a meal. Take 1200mg  in the morning and 600mg  at night    . EPINEPHrine 0.3 mg/0.3 mL IJ SOAJ injection Inject 0.3 mg into the muscle as  directed. Inject into the muscle once as needed for anaphylaxis reaction    . Fexofenadine HCl (ALLERGY 24-HR PO) Take 180 mg by mouth daily.     No current facility-administered medications for this visit.     Allergies  Allergen Reactions  . Beta Adrenergic Blockers     Lethargic, increased tachycardia  . Cisapride Other (See Comments)    Prolonged QT syndrome  . Methylphenidate Hcl Other (See Comments)    Increased tachycardia  . Nitrofurantoin Nausea And Vomiting  . Propranolol Other (See Comments)    Lethargy and fatigue  . Sertraline Hcl Diarrhea  . Sulfamethoxazole-Trimethoprim Nausea And Vomiting  . Amphetamine Palpitations  . Ciprofloxacin Hives and Rash  . Dextroamphetamine Palpitations  . Labetalol Rash  . Mononessa [Norgestimate-Eth Estradiol] Rash  . Sulfa Antibiotics Rash    Other reaction(s): GI Intolerance  . Vancomycin Hives and Rash      Review of Systems negative except from HPI and PMH  Physical Exam BP (!) 157/105   Pulse 80   Ht 5\' 7"  (1.702 m)   Wt 164 lb 12.8 oz (74.8 kg)   BMI 25.81 kg/m  Well developed and nourished in no acute distress HENT normal Neck supple with JVP-flat Carotids brisk and full without bruits Clear Regular rate and rhythm, no murmurs or gallops Abd-soft with active BS without hepatomegaly No Clubbing cyanosis edema Skin-warm and dry A & Oriented  Grossly normal sensory and motor function [name]  ECG  Sinus 80  15/09/4     Assessment and  Plan  Dysautonomia  Hypertension  We continue to struggle with managing her blood pressure.  She has a potential appointment with Dr. Hollie Salk.  I will discuss with her the value of following up with this versus going to hypertensive clinic.  She reminds me that the cost of the tests are things that she is still paying back to 2015.  She was unable to tolerate the clonidine or labetalol.  We will try her on low-dose diltiazem, spironolactone, lisinopril, or losartan.   She will try them in random order.  We are looking first for drug that she can tolerate.       Current medicines are reviewed at length with the patient today .  The patient does not  have concerns regarding medicines.

## 2017-09-14 ENCOUNTER — Ambulatory Visit: Payer: BC Managed Care – PPO | Admitting: Internal Medicine

## 2017-10-19 ENCOUNTER — Telehealth: Payer: Self-pay

## 2017-10-19 DIAGNOSIS — I10 Essential (primary) hypertension: Secondary | ICD-10-CM

## 2017-10-19 MED ORDER — SPIRONOLACTONE 25 MG PO TABS: 25.0000 mg | ORAL_TABLET | Freq: Every day | ORAL | 1 refills | Status: DC

## 2017-10-19 NOTE — Telephone Encounter (Signed)
Pt confirms she is taking spironolactone 25 mg QD. Will send in refill. She will come in 10/29/17 for BMET per Dr. Caryl Comes for surveillance

## 2017-10-29 ENCOUNTER — Other Ambulatory Visit: Payer: BC Managed Care – PPO

## 2017-10-29 DIAGNOSIS — I10 Essential (primary) hypertension: Secondary | ICD-10-CM

## 2017-10-30 LAB — BASIC METABOLIC PANEL
BUN / CREAT RATIO: 18 (ref 9–23)
BUN: 13 mg/dL (ref 6–20)
CALCIUM: 7.9 mg/dL — AB (ref 8.7–10.2)
CO2: 25 mmol/L (ref 20–29)
CREATININE: 0.74 mg/dL (ref 0.57–1.00)
Chloride: 103 mmol/L (ref 96–106)
GFR calc non Af Amer: 108 mL/min/{1.73_m2} (ref >59)
GFR, EST AFRICAN AMERICAN: 125 mL/min/{1.73_m2} (ref >59)
Glucose: 97 mg/dL (ref 65–99)
Potassium: 4 mmol/L (ref 3.5–5.2)
Sodium: 142 mmol/L (ref 134–144)

## 2017-11-22 ENCOUNTER — Other Ambulatory Visit: Payer: Self-pay | Admitting: Endocrinology

## 2017-11-26 LAB — BASIC METABOLIC PANEL
BUN: 12 (ref 4–21)
CO2: 26 — AB (ref 13–22)
Chloride: 99 (ref 99–108)
Creatinine: 0.7 (ref 0.5–1.1)
Glucose: 100
Potassium: 3.7 (ref 3.4–5.3)
Sodium: 138 (ref 137–147)

## 2017-11-26 LAB — COMPREHENSIVE METABOLIC PANEL
Albumin: 4.8 (ref 3.5–5.0)
Calcium: 8.2 — AB (ref 8.7–10.7)
GFR calc Af Amer: 131
GFR calc non Af Amer: 114

## 2017-12-30 ENCOUNTER — Other Ambulatory Visit: Payer: Self-pay | Admitting: Internal Medicine

## 2018-01-10 ENCOUNTER — Other Ambulatory Visit: Payer: Self-pay | Admitting: Endocrinology

## 2018-01-11 ENCOUNTER — Ambulatory Visit: Payer: BC Managed Care – PPO | Admitting: Endocrinology

## 2018-01-14 ENCOUNTER — Encounter: Payer: Self-pay | Admitting: Endocrinology

## 2018-01-14 ENCOUNTER — Ambulatory Visit: Payer: BC Managed Care – PPO | Admitting: Endocrinology

## 2018-01-14 VITALS — BP 118/80 | HR 84 | Wt 165.0 lb

## 2018-01-14 DIAGNOSIS — E209 Hypoparathyroidism, unspecified: Secondary | ICD-10-CM | POA: Diagnosis not present

## 2018-01-14 LAB — MAGNESIUM: MAGNESIUM: 1.7 mg/dL (ref 1.5–2.5)

## 2018-01-14 LAB — VITAMIN D 25 HYDROXY (VIT D DEFICIENCY, FRACTURES): VITD: 24.3 ng/mL — AB (ref 30.00–100.00)

## 2018-01-14 NOTE — Patient Instructions (Addendum)
blood tests are requested for you today.  We'll let you know about the results.  Please come back for a follow-up appointment in 6 months, or sooner if the pregancy happens However, in view of your medical condition, you should avoid pregnancy until we have decided it is safe.

## 2018-01-14 NOTE — Progress Notes (Signed)
Subjective:    Patient ID: Alisha Copeland, female    DOB: [birth year], 32 y.o.   MRN: [id number]  HPI Pt returns for f/u of primary hypoparathyroidism (dx'ed at age 53; she has been on rocaltrol since then; at age 63, pt says she was hospitalized in ICU for ARF, due to overtreatment/hypercalcemia; she has no h/o adrenal dz, infertility, vitiligo, urolithiasis, ceilac disease, yeast infections, pernicious anemia, or liver disease; she is considering a pregnancy at some point, but hesitates, due this condition; pt says she had genetic testing many years ago, but no hereditary syndrome was found; she declines Natpara).  she is not considering a pregnancy now.  Pt says she inconsistently takes the rocaltrol and calcium.  pt states she feels well in general, except for mild muscle weakness.  She takes no vit-D supplement.  Past Medical History:  Diagnosis Date  . Asthma   . Chronic kidney disease    UTI's  . GERD (gastroesophageal reflux disease)   . Hypertension   . Hypoparathyroidism ([place])   . POTS (postural orthostatic tachycardia syndrome)     History reviewed. No pertinent surgical history.  Social History   Socioeconomic History  . Marital status: Married    Spouse name: Not on file  . Number of children: Not on file  . Years of education: Not on file  . Highest education level: Not on file  Occupational History  . Not on file  Social Needs  . Financial resource strain: Not on file  . Food insecurity:    Worry: Not on file    Inability: Not on file  . Transportation needs:    Medical: Not on file    Non-medical: Not on file  Tobacco Use  . Smoking status: Never Smoker  . Smokeless tobacco: Never Used  Substance and Sexual Activity  . Alcohol use: Yes    Comment: occassionaly  . Drug use: No  . Sexual activity: Yes    Birth control/protection: Pill  Lifestyle  . Physical activity:    Days per week: Not on file    Minutes per session: Not on file  . Stress: Not  on file  Relationships  . Social connections:    Talks on phone: Not on file    Gets together: Not on file    Attends religious service: Not on file    Active member of club or organization: Not on file    Attends meetings of clubs or organizations: Not on file    Relationship status: Not on file  . Intimate partner violence:    Fear of current or ex partner: Not on file    Emotionally abused: Not on file    Physically abused: Not on file    Forced sexual activity: Not on file  Other Topics Concern  . Not on file  Social History Narrative  . Not on file    Current Outpatient Medications on File Prior to Visit  Medication Sig Dispense Refill  . adapalene (DIFFERIN) 0.1 % gel Apply 1 application topically daily as needed. acne  0  . albuterol (PROVENTIL HFA;VENTOLIN HFA) 108 (90 BASE) MCG/ACT inhaler Inhale 2 puffs into the lungs every 6 (six) hours as needed for wheezing. 1 Inhaler 1  . calcitRIOL (ROCALTROL) 0.5 MCG capsule TAKE 2 CAPSULES(1 MCG) BY MOUTH TWICE DAILY 180 capsule 0  . calcium carbonate (OS-CAL) 600 MG TABS Take 600-1,200 mg by mouth 2 (two) times daily with a meal. Take 1200mg  in the morning  and 600mg  at night    . EPINEPHrine 0.3 mg/0.3 mL IJ SOAJ injection Inject 0.3 mg into the muscle as directed. Inject into the muscle once as needed for anaphylaxis reaction    . Fexofenadine HCl (ALLERGY 24-HR PO) Take 180 mg by mouth daily.    Marland Kitchen losartan-hydrochlorothiazide (HYZAAR) 50-12.5 MG tablet Take 1 tablet by mouth daily.    Marland Kitchen spironolactone (ALDACTONE) 25 MG tablet TAKE 1 TABLET(25 MG) BY MOUTH DAILY 30 tablet 7   No current facility-administered medications on file prior to visit.     Allergies  Allergen Reactions  . Beta Adrenergic Blockers     Lethargic, increased tachycardia  . Cisapride Other (See Comments)    Prolonged QT syndrome  . Methylphenidate Hcl Other (See Comments)    Increased tachycardia  . Nitrofurantoin Nausea And Vomiting  . Propranolol  Other (See Comments)    Lethargy and fatigue  . Sertraline Hcl Diarrhea  . Sulfamethoxazole-Trimethoprim Nausea And Vomiting  . Amphetamine Palpitations  . Ciprofloxacin Hives and Rash  . Dextroamphetamine Palpitations  . Labetalol Rash  . Mononessa [Norgestimate-Eth Estradiol] Rash  . Sulfa Antibiotics Rash    Other reaction(s): GI Intolerance  . Vancomycin Hives and Rash    Family History  Problem Relation Age of Onset  . Hypertension Father   . Irritable bowel syndrome Mother   . Hypertension Paternal Grandfather   . Lung cancer Maternal Grandfather   . Diabetes Maternal Grandmother   . Crohn's disease Maternal Grandmother   . Colon cancer Neg Hx   . Rectal cancer Neg Hx   . Stomach cancer Neg Hx   . Other Neg Hx        hypoparathyroidism    BP 118/80 (BP Location: Left Arm, Patient Position: Sitting, Cuff Size: Normal)   Pulse 84   Wt 165 lb (74.8 kg)   SpO2 97%   BMI 25.84 kg/m    Review of Systems Denies cramps and numbness.     Objective:   Physical Exam VITAL SIGNS:  See vs page GENERAL: no distress NECK: There is no palpable thyroid enlargement.  No thyroid nodule is palpable.  No palpable lymphadenopathy at the anterior neck.       Assessment & Plan:  Hypoparathyroidism, due for recheck  Patient Instructions  blood tests are requested for you today.  We'll let you know about the results.  Please come back for a follow-up appointment in 6 months, or sooner if the pregancy happens However, in view of your medical condition, you should avoid pregnancy until we have decided it is safe.

## 2018-01-16 LAB — PTH, INTACT AND CALCIUM
CALCIUM: 8 mg/dL — AB (ref 8.6–10.2)
PTH: 19 pg/mL (ref 14–64)

## 2018-01-23 ENCOUNTER — Telehealth: Payer: Self-pay | Admitting: Endocrinology

## 2018-01-23 NOTE — Telephone Encounter (Signed)
Patient would like a call back to discuss her lab results. Please advise

## 2018-01-25 NOTE — Telephone Encounter (Signed)
LVM for patient to call back with questions

## 2018-01-30 ENCOUNTER — Encounter: Payer: Self-pay | Admitting: Endocrinology

## 2018-07-16 ENCOUNTER — Ambulatory Visit (INDEPENDENT_AMBULATORY_CARE_PROVIDER_SITE_OTHER): Payer: Managed Care, Other (non HMO) | Admitting: Endocrinology

## 2018-07-16 ENCOUNTER — Encounter: Payer: Self-pay | Admitting: Endocrinology

## 2018-07-16 VITALS — BP 118/76 | HR 72 | Ht 67.0 in | Wt 154.0 lb

## 2018-07-16 DIAGNOSIS — E209 Hypoparathyroidism, unspecified: Secondary | ICD-10-CM | POA: Diagnosis not present

## 2018-07-16 MED ORDER — CALCITRIOL 0.5 MCG PO CAPS: 2.0000 ug | ORAL_CAPSULE | Freq: Every day | ORAL | 3 refills | Status: DC

## 2018-07-16 NOTE — Patient Instructions (Addendum)
I have sent a prescription to your pharmacy, to resume the calcitriol blood tests are requested for you today.  Please do in 2 weeks. We'll let you know about the results.  Please come back for a follow-up appointment in 6 months, or sooner if the pregancy happens.   However, in view of your medical condition, you should avoid pregnancy until we have decided it is safe.

## 2018-07-16 NOTE — Progress Notes (Signed)
Subjective:    Patient ID: Alisha Copeland, female    DOB: Nov 15, 1985, 32 y.o.   MRN: [id number]  HPI Pt returns for f/u of primary hypoparathyroidism (dx'ed at age 65; she has been on rocaltrol since then; at age 65, pt says she was hospitalized in ICU for ARF, due to overtreatment/hypercalcemia; she has no h/o adrenal dz, infertility, vitiligo, urolithiasis, ceilac disease, yeast infections, pernicious anemia, or liver disease; she is considering a pregnancy at some point, but hesitates, due this condition; pt says she had genetic testing many years ago, but no hereditary syndrome was found; she declines Natpara).  she is not considering a pregnancy now. She has been off rocaltrol x a few days, due to a gap in insurance.  Since then, she has slight cramps in the legs.  She takes a multivitamin, but not a dedicated vit-d supplement.   Past Medical History:  Diagnosis Date  . Asthma   . Chronic kidney disease    UTI's  . GERD (gastroesophageal reflux disease)   . Hypertension   . Hypoparathyroidism ([place])   . POTS (postural orthostatic tachycardia syndrome)     No past surgical history on file.  Social History   Socioeconomic History  . Marital status: Married    Spouse name: Not on file  . Number of children: Not on file  . Years of education: Not on file  . Highest education level: Not on file  Occupational History  . Not on file  Social Needs  . Financial resource strain: Not on file  . Food insecurity:    Worry: Not on file    Inability: Not on file  . Transportation needs:    Medical: Not on file    Non-medical: Not on file  Tobacco Use  . Smoking status: Never Smoker  . Smokeless tobacco: Never Used  Substance and Sexual Activity  . Alcohol use: Yes    Comment: occassionaly  . Drug use: No  . Sexual activity: Yes    Birth control/protection: Pill  Lifestyle  . Physical activity:    Days per week: Not on file    Minutes per session: Not on file  . Stress:  Not on file  Relationships  . Social connections:    Talks on phone: Not on file    Gets together: Not on file    Attends religious service: Not on file    Active member of club or organization: Not on file    Attends meetings of clubs or organizations: Not on file    Relationship status: Not on file  . Intimate partner violence:    Fear of current or ex partner: Not on file    Emotionally abused: Not on file    Physically abused: Not on file    Forced sexual activity: Not on file  Other Topics Concern  . Not on file  Social History Narrative  . Not on file    Current Outpatient Medications on File Prior to Visit  Medication Sig Dispense Refill  . adapalene (DIFFERIN) 0.1 % gel Apply 1 application topically daily as needed. acne  0  . albuterol (PROVENTIL HFA;VENTOLIN HFA) 108 (90 BASE) MCG/ACT inhaler Inhale 2 puffs into the lungs every 6 (six) hours as needed for wheezing. 1 Inhaler 1  . calcium carbonate (OS-CAL) 600 MG TABS Take 600-1,200 mg by mouth 2 (two) times daily with a meal. Take 1200mg  in the morning and 600mg  at night    . EPINEPHrine 0.3  mg/0.3 mL IJ SOAJ injection Inject 0.3 mg into the muscle as directed. Inject into the muscle once as needed for anaphylaxis reaction    . Fexofenadine HCl (ALLERGY 24-HR PO) Take 180 mg by mouth daily.    Marland Kitchen losartan-hydrochlorothiazide (HYZAAR) 50-12.5 MG tablet Take 1 tablet by mouth daily.    Marland Kitchen spironolactone (ALDACTONE) 25 MG tablet TAKE 1 TABLET(25 MG) BY MOUTH DAILY 30 tablet 7   No current facility-administered medications on file prior to visit.     Allergies  Allergen Reactions  . Beta Adrenergic Blockers     Lethargic, increased tachycardia  . Cisapride Other (See Comments)    Prolonged QT syndrome  . Methylphenidate Hcl Other (See Comments)    Increased tachycardia  . Nitrofurantoin Nausea And Vomiting  . Propranolol Other (See Comments)    Lethargy and fatigue  . Sertraline Hcl Diarrhea  .  Sulfamethoxazole-Trimethoprim Nausea And Vomiting  . Amphetamine Palpitations  . Ciprofloxacin Hives and Rash  . Dextroamphetamine Palpitations  . Labetalol Rash  . Mononessa [Norgestimate-Eth Estradiol] Rash  . Sulfa Antibiotics Rash    Other reaction(s): GI Intolerance  . Vancomycin Hives and Rash    Family History  Problem Relation Age of Onset  . Hypertension Father   . Irritable bowel syndrome Mother   . Hypertension Paternal Grandfather   . Lung cancer Maternal Grandfather   . Diabetes Maternal Grandmother   . Crohn's disease Maternal Grandmother   . Colon cancer Neg Hx   . Rectal cancer Neg Hx   . Stomach cancer Neg Hx   . Other Neg Hx        hypoparathyroidism    BP 118/76 (BP Location: Left Arm)   Pulse 72   Ht 5\' 7"  (1.702 m)   Wt 154 lb (69.9 kg)   LMP 06/23/2018   SpO2 98%   BMI 24.12 kg/m    Review of Systems Denies numbness.      Objective:   Physical Exam VITAL SIGNS:  See vs page GENERAL: no distress MSK: normal strength of the UE's.   Gait: normal and steady      Assessment & Plan:  Hypothyroidism: due for recheck Missing medication: we'll resume before rechecking  Patient Instructions  I have sent a prescription to your pharmacy, to resume the calcitriol blood tests are requested for you today.  Please do in 2 weeks. We'll let you know about the results.  Please come back for a follow-up appointment in 6 months, or sooner if the pregancy happens.   However, in view of your medical condition, you should avoid pregnancy until we have decided it is safe.

## 2018-07-24 HISTORY — PX: DOPPLER ECHOCARDIOGRAPHY: SHX263

## 2018-08-07 ENCOUNTER — Telehealth: Payer: Self-pay | Admitting: Endocrinology

## 2018-08-07 NOTE — Telephone Encounter (Signed)
No good alternative.  At [facility], you can buy 100 caps for $36, without insurance.  You don't have to be a member to go to their pharmacy.  I can prescribe for you a larger quantity, of course.  OK to send?

## 2018-08-07 NOTE — Telephone Encounter (Signed)
Patient called re: Patient cannot afford Calcitriol-it is $600 for a 3 month supply with insurance. Patient wants to know if there is anyway of getting the above medication at a much lower cost or if there is a generic that could be substituted. Please call patient at ph#  [phone] to advise.

## 2018-08-07 NOTE — Telephone Encounter (Signed)
Please advise 

## 2018-08-08 MED ORDER — CALCITRIOL 0.5 MCG PO CAPS: 2.0000 ug | ORAL_CAPSULE | Freq: Every day | ORAL | 3 refills | Status: DC

## 2018-08-08 NOTE — Telephone Encounter (Signed)
Please call the pt to discuss

## 2018-08-08 NOTE — Telephone Encounter (Signed)
Called pt to discuss. She has agreed for a Rx to be sent to [facility] without use of her insurance. Would rather pay $36.00 than to proceed with use of insurance for this medication. Pt is aware to check with pharmacy later today for pick up.

## 2018-08-08 NOTE — Telephone Encounter (Signed)
Ok, I have sent a prescription to [facility].  Pt might also want to look this up on "[facility]."

## 2018-11-06 ENCOUNTER — Other Ambulatory Visit (INDEPENDENT_AMBULATORY_CARE_PROVIDER_SITE_OTHER): Payer: Managed Care, Other (non HMO)

## 2018-11-06 DIAGNOSIS — E209 Hypoparathyroidism, unspecified: Secondary | ICD-10-CM | POA: Diagnosis not present

## 2018-11-07 LAB — PTH, INTACT AND CALCIUM
CALCIUM: 8.3 mg/dL — AB (ref 8.6–10.2)
PTH: 1 pg/mL — ABNORMAL LOW (ref 14–64)

## 2018-11-07 LAB — VITAMIN D 25 HYDROXY (VIT D DEFICIENCY, FRACTURES): VITD: 24.21 ng/mL — ABNORMAL LOW (ref 30.00–100.00)

## 2018-12-02 ENCOUNTER — Encounter: Payer: Self-pay | Admitting: Endocrinology

## 2018-12-02 NOTE — Telephone Encounter (Signed)
Dr. Loanne Drilling is out ill today. Please advise about Calcitrol dosage in conjunction with pregnancy.

## 2018-12-13 ENCOUNTER — Other Ambulatory Visit: Payer: Self-pay

## 2018-12-13 ENCOUNTER — Encounter: Payer: Self-pay | Admitting: Endocrinology

## 2018-12-13 ENCOUNTER — Ambulatory Visit (INDEPENDENT_AMBULATORY_CARE_PROVIDER_SITE_OTHER): Payer: Managed Care, Other (non HMO) | Admitting: Endocrinology

## 2018-12-13 VITALS — BP 122/72 | HR 99 | Ht 67.0 in | Wt 141.8 lb

## 2018-12-13 DIAGNOSIS — E209 Hypoparathyroidism, unspecified: Secondary | ICD-10-CM

## 2018-12-13 LAB — BASIC METABOLIC PANEL WITH GFR
BUN: 18 mg/dL (ref 6–23)
CO2: 26 meq/L (ref 19–32)
Calcium: 9.9 mg/dL (ref 8.4–10.5)
Chloride: 100 meq/L (ref 96–112)
Creatinine, Ser: 0.73 mg/dL (ref 0.40–1.20)
GFR: 92.07 mL/min
Glucose, Bld: 106 mg/dL — ABNORMAL HIGH (ref 70–99)
Potassium: 3.4 meq/L — ABNORMAL LOW (ref 3.5–5.1)
Sodium: 137 meq/L (ref 135–145)

## 2018-12-13 LAB — TSH: TSH: 1.3 u[IU]/mL (ref 0.35–4.50)

## 2018-12-13 NOTE — Patient Instructions (Signed)
Blood tests are requested for you today.  We'll let you know about the results.  Please come back for a follow-up appointment in 4-6 weeks.

## 2018-12-13 NOTE — Progress Notes (Signed)
Subjective:    Patient ID: Alisha Copeland, female    DOB: [birth year], 33 y.o.   MRN: [id number]  HPI Pt returns for f/u of primary hypoparathyroidism (dx'ed at age 18; she has been on rocaltrol since then; at age 81, pt says she was hospitalized in ICU for ARF, due to overtreatment/hypercalcemia; she has no h/o adrenal dz, infertility, vitiligo, urolithiasis, ceilac disease, yeast infections, pernicious anemia, or liver disease; she is considering a pregnancy at some point, but hesitates, due this condition; pt says she had genetic testing many years ago, but no hereditary syndrome was found; she declines Natpara).  She takes a multivitamin, but not a dedicated vit-d supplement. She had a miscarriage a few mos ago, and is now [redacted] weeks pregnant.  She stopped allegra, losartan-HCTZ, and aldactone.  Past Medical History:  Diagnosis Date  . Asthma   . Chronic kidney disease    UTI's  . GERD (gastroesophageal reflux disease)   . Hypertension   . Hypoparathyroidism ([place])   . POTS (postural orthostatic tachycardia syndrome)     No past surgical history on file.  Social History   Socioeconomic History  . Marital status: Married    Spouse name: Not on file  . Number of children: Not on file  . Years of education: Not on file  . Highest education level: Not on file  Occupational History  . Not on file  Social Needs  . Financial resource strain: Not on file  . Food insecurity:    Worry: Not on file    Inability: Not on file  . Transportation needs:    Medical: Not on file    Non-medical: Not on file  Tobacco Use  . Smoking status: Never Smoker  . Smokeless tobacco: Never Used  Substance and Sexual Activity  . Alcohol use: Yes    Comment: occassionaly  . Drug use: No  . Sexual activity: Yes    Birth control/protection: Pill  Lifestyle  . Physical activity:    Days per week: Not on file    Minutes per session: Not on file  . Stress: Not on file  Relationships  . Social  connections:    Talks on phone: Not on file    Gets together: Not on file    Attends religious service: Not on file    Active member of club or organization: Not on file    Attends meetings of clubs or organizations: Not on file    Relationship status: Not on file  . Intimate partner violence:    Fear of current or ex partner: Not on file    Emotionally abused: Not on file    Physically abused: Not on file    Forced sexual activity: Not on file  Other Topics Concern  . Not on file  Social History Narrative  . Not on file    Current Outpatient Medications on File Prior to Visit  Medication Sig Dispense Refill  . adapalene (DIFFERIN) 0.1 % gel Apply 1 application topically daily as needed. acne  0  . albuterol (PROVENTIL HFA;VENTOLIN HFA) 108 (90 BASE) MCG/ACT inhaler Inhale 2 puffs into the lungs every 6 (six) hours as needed for wheezing. 1 Inhaler 1  . calcitRIOL (ROCALTROL) 0.5 MCG capsule Take 4 capsules (2 mcg total) by mouth daily. 300 capsule 3  . calcium carbonate (OS-CAL) 600 MG TABS Take 600-1,200 mg by mouth 2 (two) times daily with a meal. Take 1200mg  in the morning and 600mg  at night    .  EPINEPHrine 0.3 mg/0.3 mL IJ SOAJ injection Inject 0.3 mg into the muscle as directed. Inject into the muscle once as needed for anaphylaxis reaction     No current facility-administered medications on file prior to visit.     Allergies  Allergen Reactions  . Beta Adrenergic Blockers     Lethargic, increased tachycardia  . Cisapride Other (See Comments)    Prolonged QT syndrome  . Methylphenidate Hcl Other (See Comments)    Increased tachycardia  . Nitrofurantoin Nausea And Vomiting  . Propranolol Other (See Comments)    Lethargy and fatigue  . Sertraline Hcl Diarrhea  . Sulfamethoxazole-Trimethoprim Nausea And Vomiting  . Amphetamine Palpitations  . Ciprofloxacin Hives and Rash  . Dextroamphetamine Palpitations  . Labetalol Rash  . Mononessa [Norgestimate-Eth Estradiol]  Rash  . Sulfa Antibiotics Rash    Other reaction(s): GI Intolerance  . Vancomycin Hives and Rash    Family History  Problem Relation Age of Onset  . Hypertension Father   . Irritable bowel syndrome Mother   . Hypertension Paternal Grandfather   . Lung cancer Maternal Grandfather   . Diabetes Maternal Grandmother   . Crohn's disease Maternal Grandmother   . Colon cancer Neg Hx   . Rectal cancer Neg Hx   . Stomach cancer Neg Hx   . Other Neg Hx        hypoparathyroidism    BP 122/72 (BP Location: Left Arm, Patient Position: Sitting, Cuff Size: Normal)   Pulse 99   Ht 5\' 7"  (1.702 m)   Wt 141 lb 12.8 oz (64.3 kg)   SpO2 98%   BMI 22.21 kg/m    Review of Systems Denies muscle cramps.      Objective:   Physical Exam VITAL SIGNS:  See vs page GENERAL: no distress Gait: normal and steady.       Assessment & Plan:  Hypoparathyroidism: recheck today Pregnancy: new. Goal Ca++ is normal in this setting  Patient Instructions  Blood tests are requested for you today.  We'll let you know about the results.  Please come back for a follow-up appointment in 4-6 weeks.

## 2018-12-16 LAB — PTH, INTACT AND CALCIUM
Calcium: 10.1 mg/dL (ref 8.6–10.2)
PTH: 1 pg/mL — ABNORMAL LOW (ref 14–64)

## 2018-12-16 LAB — VITAMIN D 25 HYDROXY (VIT D DEFICIENCY, FRACTURES): VITD: 27.36 ng/mL — AB (ref 30.00–100.00)

## 2019-01-13 IMAGING — US US RENAL
1 series · 14 of 25 positions shown · non-contrast
Comparison: Abdominal ultrasound 12/06/2012

CLINICAL DATA: Hypoparathyroidism. Evaluation for renal
calcification.

EXAM:
RENAL / URINARY TRACT ULTRASOUND COMPLETE

[Series 1: us renal · 0.23mm/px · 14 of 43 slices shown]
[im 1/43]
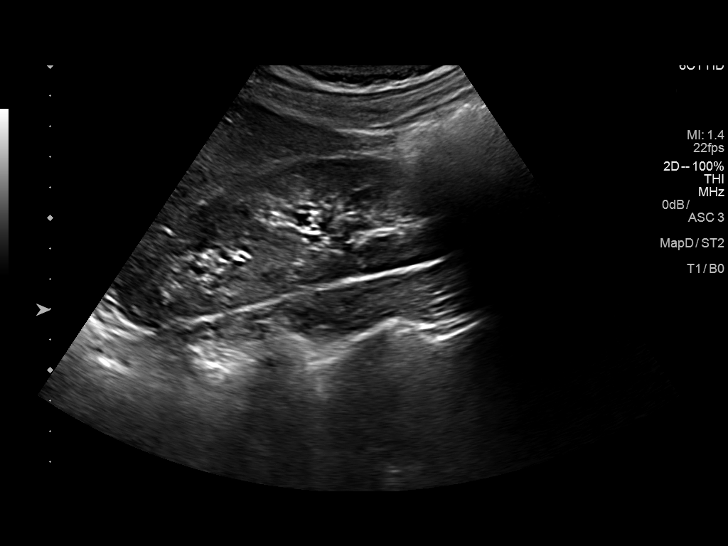
[im 4/43]
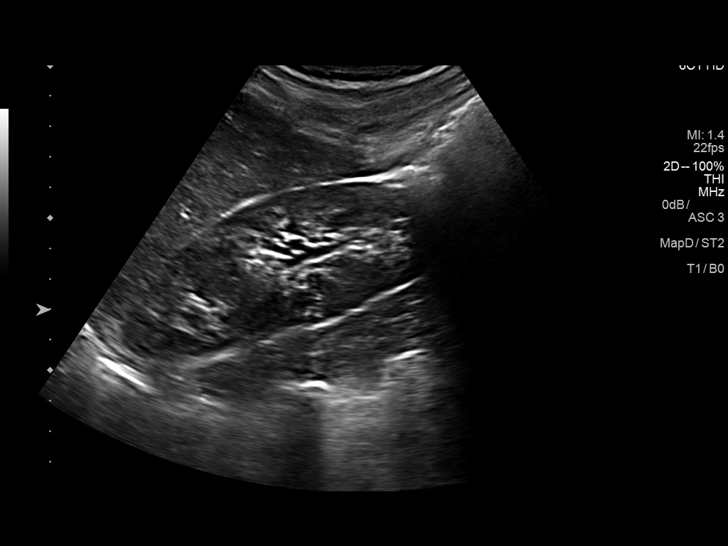
[im 8/43]
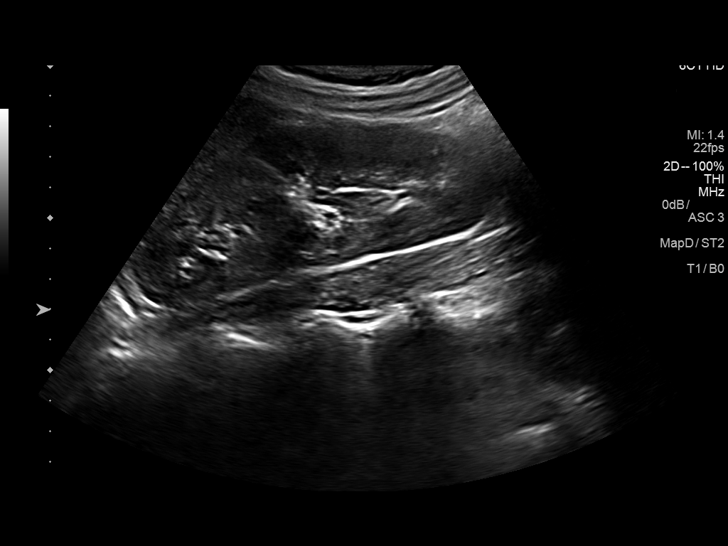
[im 11/43]
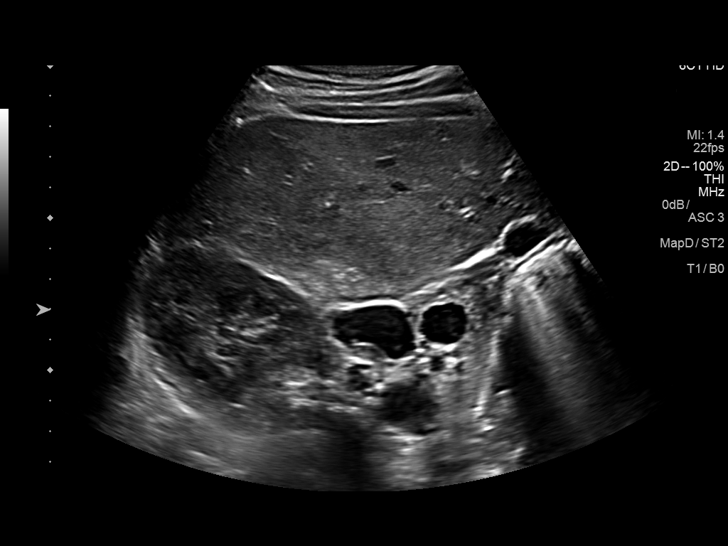
[im 15/43]
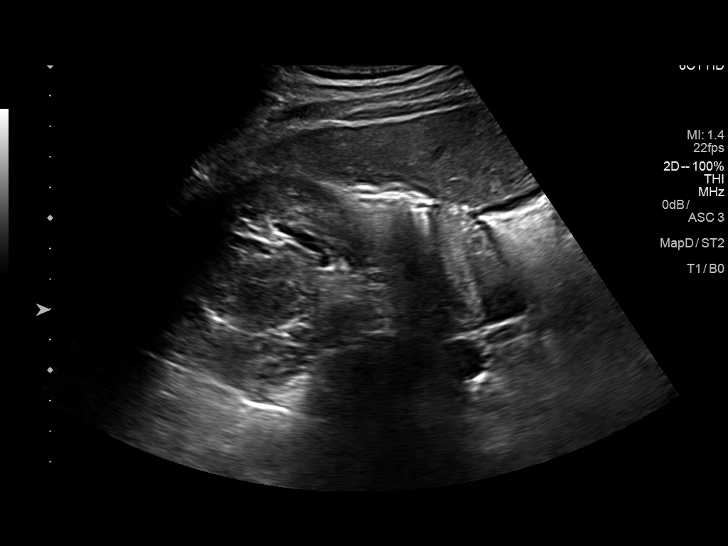
[im 16/43]
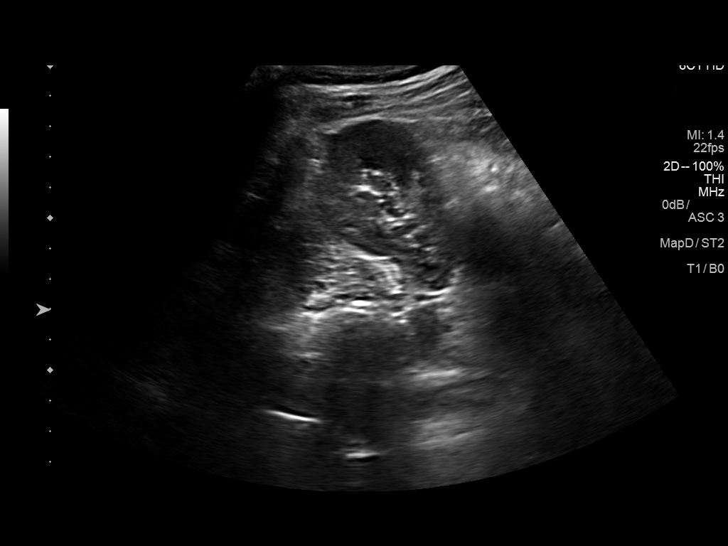
[im 20/43]
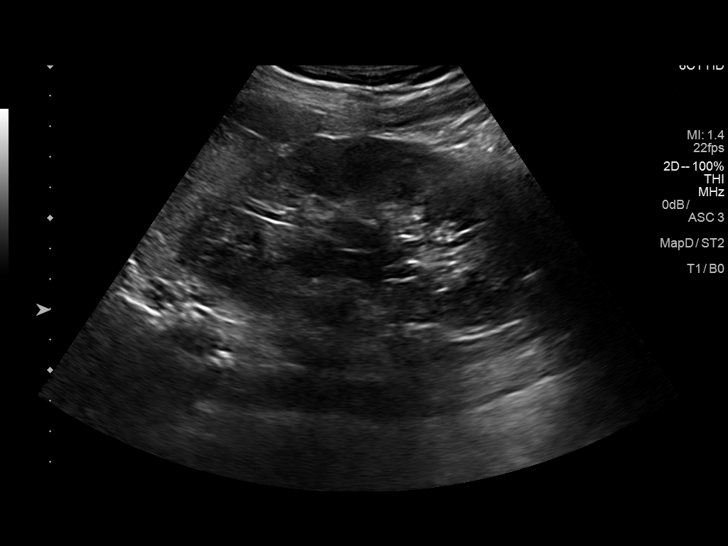
[im 23/43]
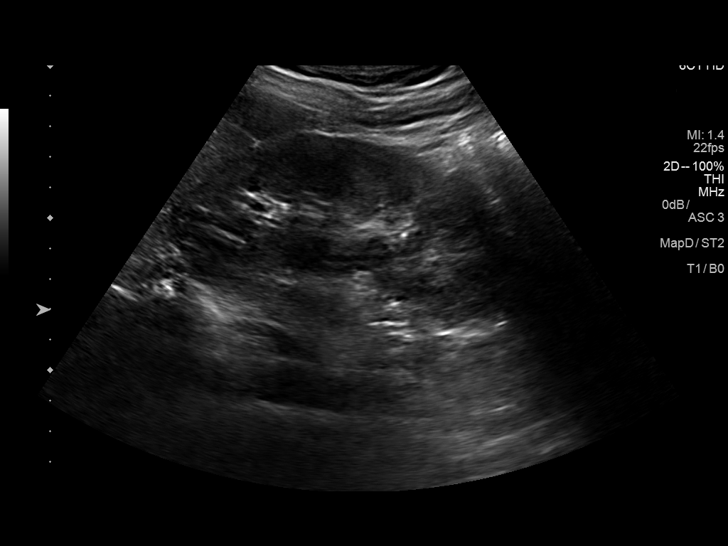
[im 27/43]
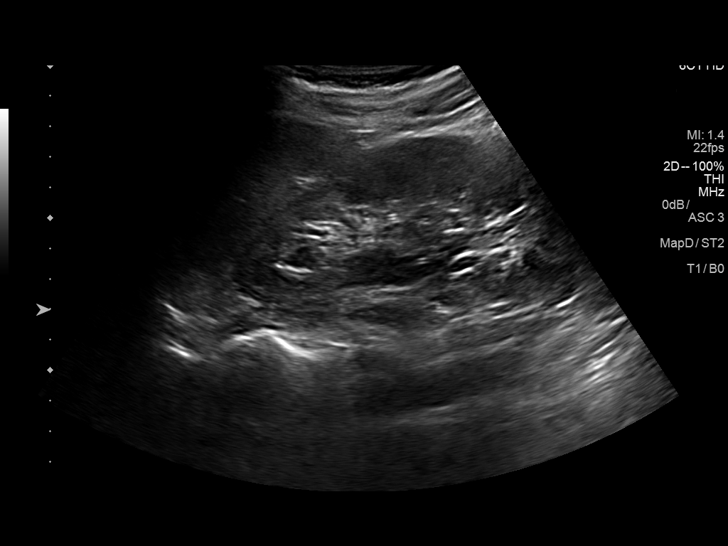
[im 29/43]
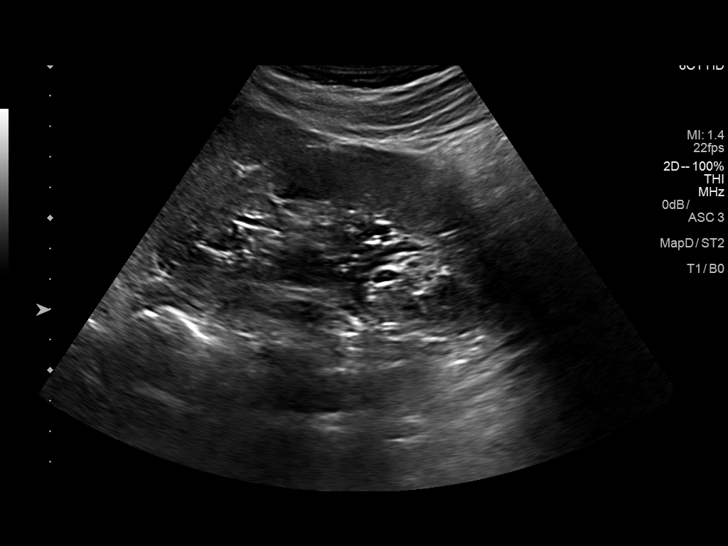
[im 32/43]
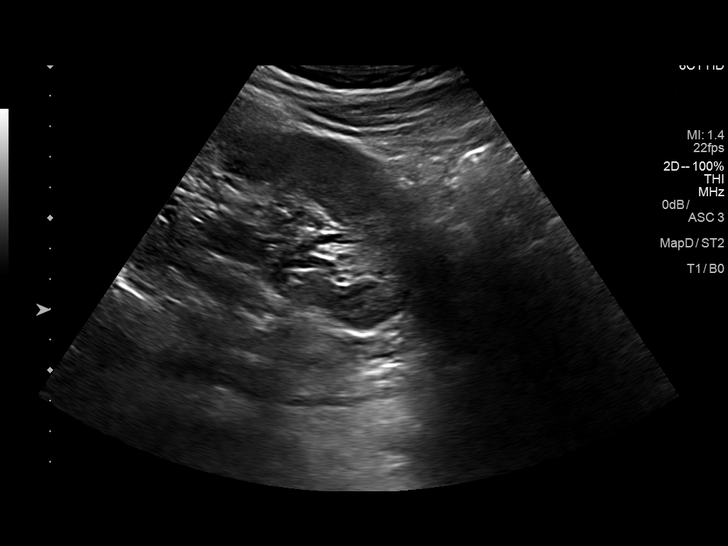
[im 36/43]
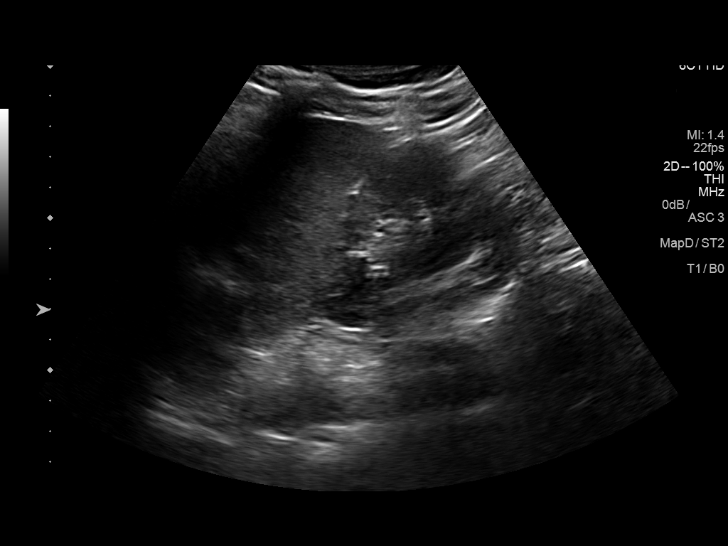
[im 39/43]
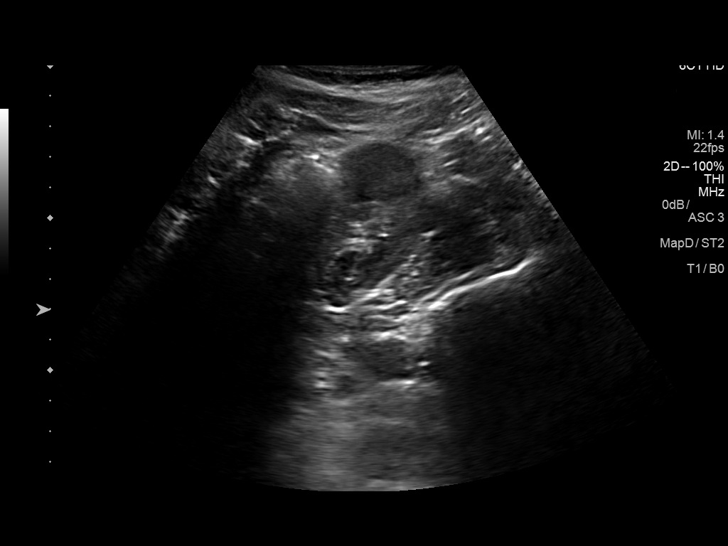
[im 43/43]
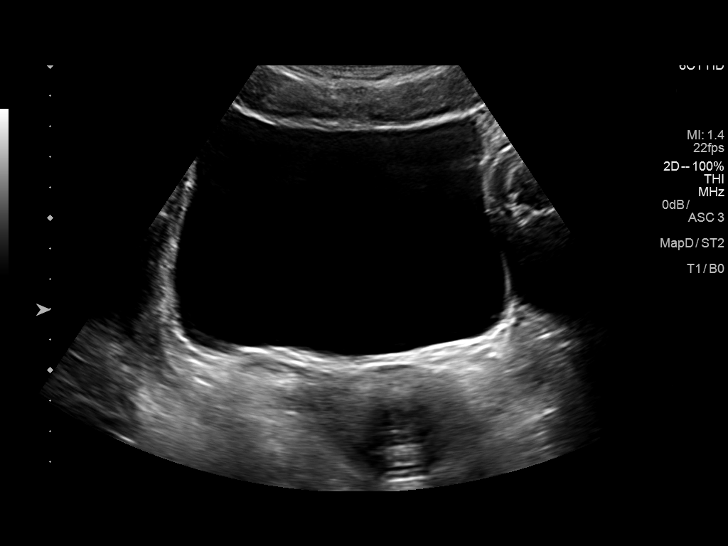

[14 of 25 positions shown; findings below may reference images not displayed]

FINDINGS: Right Kidney:

Length: 12.3 cm. Echogenicity within normal limits. No mass or
hydronephrosis visualized.

Left Kidney:

Length: 11.5 cm. Echogenicity within normal limits. No mass or
hydronephrosis visualized.

Bladder:

Appears normal for degree of bladder distention.
IMPRESSION: Unremarkable renal ultrasound.

## 2019-01-15 ENCOUNTER — Ambulatory Visit: Payer: 59 | Admitting: Endocrinology

## 2019-01-15 ENCOUNTER — Ambulatory Visit (INDEPENDENT_AMBULATORY_CARE_PROVIDER_SITE_OTHER): Payer: Managed Care, Other (non HMO) | Admitting: Endocrinology

## 2019-01-15 ENCOUNTER — Other Ambulatory Visit: Payer: Self-pay

## 2019-01-15 DIAGNOSIS — E209 Hypoparathyroidism, unspecified: Secondary | ICD-10-CM

## 2019-01-15 NOTE — Progress Notes (Addendum)
Subjective:    Patient ID: Alisha Copeland, female    DOB: Sep 07, 1986, 33 y.o.   MRN: [id number]  HPI  telehealth visit today via doxy video visit.  Alternatives to telehealth are presented to this patient, and the patient agrees to the telehealth visit. Pt is advised of the cost of the visit, and agrees to this, also.   Patient is at home, and I am at the office.   Pt returns for f/u of primary hypoparathyroidism (dx'ed at age 75; she has been on rocaltrol since then; at age 62, pt says she was hospitalized in ICU for ARF, due to overtreatment/hypercalcemia; she has no h/o adrenal dz, infertility, vitiligo, urolithiasis, ceilac disease, yeast infections, pernicious anemia, or liver disease; she is considering a pregnancy at some point, but hesitates, due this condition; pt says she had genetic testing many years ago, but no hereditary syndrome was found; she declines Natpara).  She takes a multivitamin, but not a dedicated vit-d supplement--just PNV. She is now 10 4/[redacted] weeks pregnant.  pt states she feels well in general.  Denies numbness and muscle cramps.   Past Medical History:  Diagnosis Date  . Asthma   . Chronic kidney disease    UTI's  . GERD (gastroesophageal reflux disease)   . Hypertension   . Hypoparathyroidism ([place])   . POTS (postural orthostatic tachycardia syndrome)     No past surgical history on file.  Social History   Socioeconomic History  . Marital status: Married    Spouse name: Not on file  . Number of children: Not on file  . Years of education: Not on file  . Highest education level: Not on file  Occupational History  . Not on file  Social Needs  . Financial resource strain: Not on file  . Food insecurity:    Worry: Not on file    Inability: Not on file  . Transportation needs:    Medical: Not on file    Non-medical: Not on file  Tobacco Use  . Smoking status: Never Smoker  . Smokeless tobacco: Never Used  Substance and Sexual Activity  .  Alcohol use: Yes    Comment: occassionaly  . Drug use: No  . Sexual activity: Yes    Birth control/protection: Pill  Lifestyle  . Physical activity:    Days per week: Not on file    Minutes per session: Not on file  . Stress: Not on file  Relationships  . Social connections:    Talks on phone: Not on file    Gets together: Not on file    Attends religious service: Not on file    Active member of club or organization: Not on file    Attends meetings of clubs or organizations: Not on file    Relationship status: Not on file  . Intimate partner violence:    Fear of current or ex partner: Not on file    Emotionally abused: Not on file    Physically abused: Not on file    Forced sexual activity: Not on file  Other Topics Concern  . Not on file  Social History Narrative  . Not on file    Current Outpatient Medications on File Prior to Visit  Medication Sig Dispense Refill  . albuterol (PROVENTIL HFA;VENTOLIN HFA) 108 (90 BASE) MCG/ACT inhaler Inhale 2 puffs into the lungs every 6 (six) hours as needed for wheezing. 1 Inhaler 1  . calcitRIOL (ROCALTROL) 0.5 MCG capsule Take 4 capsules (  2 mcg total) by mouth daily. 300 capsule 3  . calcium carbonate (OS-CAL) 600 MG TABS Take 600-1,200 mg by mouth 2 (two) times daily with a meal. Take 1200mg  in the morning and 600mg  at night    . EPINEPHrine 0.3 mg/0.3 mL IJ SOAJ injection Inject 0.3 mg into the muscle as directed. Inject into the muscle once as needed for anaphylaxis reaction    . labetalol (NORMODYNE) 100 MG tablet labetalol 100 mg tablet    . adapalene (DIFFERIN) 0.1 % gel Apply 1 application topically daily as needed. acne  0   No current facility-administered medications on file prior to visit.     Allergies  Allergen Reactions  . Beta Adrenergic Blockers     Lethargic, increased tachycardia  . Cisapride Other (See Comments)    Prolonged QT syndrome  . Methylphenidate Hcl Other (See Comments)    Increased tachycardia  .  Nitrofurantoin Nausea And Vomiting  . Propranolol Other (See Comments)    Lethargy and fatigue  . Sertraline Hcl Diarrhea  . Sulfamethoxazole-Trimethoprim Nausea And Vomiting  . Amphetamine Palpitations  . Ciprofloxacin Hives and Rash  . Dextroamphetamine Palpitations  . Labetalol Rash  . Mononessa [Norgestimate-Eth Estradiol] Rash  . Sulfa Antibiotics Rash    Other reaction(s): GI Intolerance  . Vancomycin Hives and Rash    Family History  Problem Relation Age of Onset  . Hypertension Father   . Irritable bowel syndrome Mother   . Hypertension Paternal Grandfather   . Lung cancer Maternal Grandfather   . Diabetes Maternal Grandmother   . Crohn's disease Maternal Grandmother   . Colon cancer Neg Hx   . Rectal cancer Neg Hx   . Stomach cancer Neg Hx   . Other Neg Hx        hypoparathyroidism     Review of Systems Denies nausea.     Objective:   Physical Exam      Assessment & Plan:  Hypoparathyroidism: due for recheck.  Pregnancy: in this setting, goal is low-normal Ca++ level.    Patient Instructions  Please continue the same medication. When you go to [facility] in 2 days, please see if they can add a vitamin-D and BMET (both E20.9).  Please redo the blood tests in approx 4 weeks.  Please come back for a follow-up appointment in 2 months.

## 2019-01-15 NOTE — Patient Instructions (Addendum)
Please continue the same medication. When you go to [facility] in 2 days, please see if they can add a vitamin-D and BMET (both E20.9).  Please redo the blood tests in approx 4 weeks.  Please come back for a follow-up appointment in 2 months.

## 2019-01-17 LAB — OB RESULTS CONSOLE HIV ANTIBODY (ROUTINE TESTING): HIV: NONREACTIVE

## 2019-01-17 LAB — OB RESULTS CONSOLE GC/CHLAMYDIA
Chlamydia: NEGATIVE
Gonorrhea: NEGATIVE

## 2019-01-17 LAB — OB RESULTS CONSOLE ABO/RH: RH Type: NEGATIVE

## 2019-01-17 LAB — OB RESULTS CONSOLE RPR: RPR: NONREACTIVE

## 2019-01-17 LAB — OB RESULTS CONSOLE ANTIBODY SCREEN: Antibody Screen: NEGATIVE

## 2019-01-17 LAB — OB RESULTS CONSOLE RUBELLA ANTIBODY, IGM: Rubella: IMMUNE

## 2019-01-17 LAB — OB RESULTS CONSOLE HEPATITIS B SURFACE ANTIGEN: Hepatitis B Surface Ag: NEGATIVE

## 2019-01-21 ENCOUNTER — Telehealth: Payer: Self-pay | Admitting: Endocrinology

## 2019-01-21 NOTE — Telephone Encounter (Signed)
[facility] / Dr. Julien Girt  Fax # [phone]  Patient has called in regards to Dr. Loanne Drilling stating he was going to send over her lab orders to her OBGYN. They have not received it.

## 2019-01-21 NOTE — Telephone Encounter (Signed)
Recipients Sent On Sent By Routed Reports   Marylynn Pearson, MD     01/21/2019 10:59 AM Aleatha Borer, LPN BASIC METABOLIC PANEL ([id number])     Cover Page Message : Attached is a copy of her BMP order       Marylynn Pearson, MD     01/21/2019 10:58 AM Altamese Deguire, LPN VITAMIN D 25 HYDROXY (VIT D DEFICIENCY, FRACTURES) ([id number])     Cover Page Message : Attached is the order for her Vit D level.       Above orders faxed electronically today as requested. See above.

## 2019-03-04 ENCOUNTER — Other Ambulatory Visit (INDEPENDENT_AMBULATORY_CARE_PROVIDER_SITE_OTHER): Payer: Managed Care, Other (non HMO)

## 2019-03-04 ENCOUNTER — Other Ambulatory Visit: Payer: Self-pay

## 2019-03-04 DIAGNOSIS — E209 Hypoparathyroidism, unspecified: Secondary | ICD-10-CM

## 2019-03-04 LAB — BASIC METABOLIC PANEL
BUN: 12 mg/dL (ref 6–23)
CO2: 23 mEq/L (ref 19–32)
Calcium: 8.9 mg/dL (ref 8.4–10.5)
Chloride: 102 mEq/L (ref 96–112)
Creatinine, Ser: 0.53 mg/dL (ref 0.40–1.20)
GFR: 133.03 mL/min (ref >60.00)
Glucose, Bld: 68 mg/dL — ABNORMAL LOW (ref 70–99)
Potassium: 3.8 mEq/L (ref 3.5–5.1)
Sodium: 133 mEq/L — ABNORMAL LOW (ref 135–145)

## 2019-03-04 LAB — VITAMIN D 25 HYDROXY (VIT D DEFICIENCY, FRACTURES): VITD: 21.08 ng/mL — ABNORMAL LOW (ref 30.00–100.00)

## 2019-03-14 ENCOUNTER — Other Ambulatory Visit: Payer: Self-pay

## 2019-03-18 ENCOUNTER — Ambulatory Visit (INDEPENDENT_AMBULATORY_CARE_PROVIDER_SITE_OTHER): Payer: Managed Care, Other (non HMO) | Admitting: Endocrinology

## 2019-03-18 ENCOUNTER — Encounter: Payer: Self-pay | Admitting: Endocrinology

## 2019-03-18 ENCOUNTER — Other Ambulatory Visit: Payer: Self-pay

## 2019-03-18 VITALS — BP 118/64 | HR 92 | Temp 98.4°F | Wt 158.0 lb

## 2019-03-18 DIAGNOSIS — R739 Hyperglycemia, unspecified: Secondary | ICD-10-CM

## 2019-03-18 DIAGNOSIS — E559 Vitamin D deficiency, unspecified: Secondary | ICD-10-CM

## 2019-03-18 DIAGNOSIS — E209 Hypoparathyroidism, unspecified: Secondary | ICD-10-CM

## 2019-03-18 LAB — BASIC METABOLIC PANEL
BUN: 11 mg/dL (ref 6–23)
CO2: 24 mEq/L (ref 19–32)
Calcium: 8.9 mg/dL (ref 8.4–10.5)
Chloride: 103 mEq/L (ref 96–112)
Creatinine, Ser: 0.48 mg/dL (ref 0.40–1.20)
GFR: 149.12 mL/min (ref >60.00)
Glucose, Bld: 77 mg/dL (ref 70–99)
Potassium: 3.7 mEq/L (ref 3.5–5.1)
Sodium: 134 mEq/L — ABNORMAL LOW (ref 135–145)

## 2019-03-18 LAB — HEMOGLOBIN A1C: Hgb A1c MFr Bld: 4.9 % (ref 4.6–6.5)

## 2019-03-18 LAB — VITAMIN D 25 HYDROXY (VIT D DEFICIENCY, FRACTURES): VITD: 29.29 ng/mL — ABNORMAL LOW (ref 30.00–100.00)

## 2019-03-18 NOTE — Progress Notes (Signed)
Subjective:    Patient ID: Alisha Copeland, female    DOB: [birth year], 33 y.o.   MRN: [id number]  HPI Pt returns for f/u of primary hypoparathyroidism (dx'ed at age 89; she has been on rocaltrol since then; at age 74, pt says she was hospitalized in ICU for ARF, due to overtreatment/hypercalcemia; she has no h/o adrenal dz, infertility, vitiligo, urolithiasis, ceilac disease, yeast infections, pernicious anemia, or liver disease; she is considering a pregnancy at some point, but hesitates, due this condition; pt says she had genetic testing many years ago, but no hereditary syndrome was found; she declines Natpara).  She takes a multivitamin, but not a dedicated vit-d supplement--just PNV. She is now [redacted] weeks pregnant.  pt states she feels well in general.  She has slight cramps in the feet, but no assoc numbness. She takes vit-D, 400 units/day (plus what is in PNV).   Past Medical History:  Diagnosis Date  . Asthma   . Chronic kidney disease    UTI's  . GERD (gastroesophageal reflux disease)   . Hypertension   . Hypoparathyroidism ([place])   . POTS (postural orthostatic tachycardia syndrome)     No past surgical history on file.  Social History   Socioeconomic History  . Marital status: Married    Spouse name: Not on file  . Number of children: Not on file  . Years of education: Not on file  . Highest education level: Not on file  Occupational History  . Not on file  Social Needs  . Financial resource strain: Not on file  . Food insecurity:    Worry: Not on file    Inability: Not on file  . Transportation needs:    Medical: Not on file    Non-medical: Not on file  Tobacco Use  . Smoking status: Never Smoker  . Smokeless tobacco: Never Used  Substance and Sexual Activity  . Alcohol use: Yes    Comment: occassionaly  . Drug use: No  . Sexual activity: Yes    Birth control/protection: Pill  Lifestyle  . Physical activity:    Days per week: Not on file    Minutes per  session: Not on file  . Stress: Not on file  Relationships  . Social connections:    Talks on phone: Not on file    Gets together: Not on file    Attends religious service: Not on file    Active member of club or organization: Not on file    Attends meetings of clubs or organizations: Not on file    Relationship status: Not on file  . Intimate partner violence:    Fear of current or ex partner: Not on file    Emotionally abused: Not on file    Physically abused: Not on file    Forced sexual activity: Not on file  Other Topics Concern  . Not on file  Social History Narrative  . Not on file    Current Outpatient Medications on File Prior to Visit  Medication Sig Dispense Refill  . adapalene (DIFFERIN) 0.1 % gel Apply 1 application topically daily as needed. acne  0  . albuterol (PROVENTIL HFA;VENTOLIN HFA) 108 (90 BASE) MCG/ACT inhaler Inhale 2 puffs into the lungs every 6 (six) hours as needed for wheezing. 1 Inhaler 1  . calcitRIOL (ROCALTROL) 0.5 MCG capsule Take 4 capsules (2 mcg total) by mouth daily. 300 capsule 3  . calcium carbonate (OS-CAL) 600 MG TABS Take 600-1,200 mg by  mouth 2 (two) times daily with a meal. Take 1200mg  in the morning and 600mg  at night    . EPINEPHrine 0.3 mg/0.3 mL IJ SOAJ injection Inject 0.3 mg into the muscle as directed. Inject into the muscle once as needed for anaphylaxis reaction    . labetalol (NORMODYNE) 100 MG tablet labetalol 100 mg tablet     No current facility-administered medications on file prior to visit.     Allergies  Allergen Reactions  . Beta Adrenergic Blockers     Lethargic, increased tachycardia  . Cisapride Other (See Comments)    Prolonged QT syndrome  . Methylphenidate Hcl Other (See Comments)    Increased tachycardia  . Nitrofurantoin Nausea And Vomiting  . Propranolol Other (See Comments)    Lethargy and fatigue  . Sertraline Hcl Diarrhea  . Sulfamethoxazole-Trimethoprim Nausea And Vomiting  . Amphetamine  Palpitations  . Ciprofloxacin Hives and Rash  . Dextroamphetamine Palpitations  . Labetalol Rash  . Mononessa [Norgestimate-Eth Estradiol] Rash  . Sulfa Antibiotics Rash    Other reaction(s): GI Intolerance  . Vancomycin Hives and Rash    Family History  Problem Relation Age of Onset  . Hypertension Father   . Irritable bowel syndrome Mother   . Hypertension Paternal Grandfather   . Lung cancer Maternal Grandfather   . Diabetes Maternal Grandmother   . Crohn's disease Maternal Grandmother   . Colon cancer Neg Hx   . Rectal cancer Neg Hx   . Stomach cancer Neg Hx   . Other Neg Hx        hypoparathyroidism    BP 118/64 (BP Location: Left Arm, Patient Position: Sitting, Cuff Size: Normal)   Pulse 92   Temp 98.4 F (36.9 C) (Oral)   Wt 158 lb (71.7 kg)   SpO2 97%   BMI 24.75 kg/m    Review of Systems She has gained 15 lbs so far.  Denies n/v.      Objective:   Physical Exam VITAL SIGNS:  See vs page GENERAL: no distress NECK: thyroid is slightly and diffusely enlarged.  No thyroid nodule is palpable.  No palpable lymphadenopathy at the anterior neck.      Lab Results  Component Value Date   CREATININE 0.48 03/18/2019   BUN 11 03/18/2019   NA 134 (L) 03/18/2019   K 3.7 03/18/2019   CL 103 03/18/2019   CO2 24 03/18/2019   Lab Results  Component Value Date   PTH 1 (L) 12/13/2018   CALCIUM 8.9 03/18/2019   CAION 0.89 (L) 12/16/2015   PHOS 4.0 01/10/2017   25-OH vit-D=29    Assessment & Plan:  Hypoparathyroidism: well-controlled.  Please continue the same calcitriol Pregnancy: we need to follow closer during this time.  Vit-D def: improved control.  Please continue the same medication  Patient Instructions  Blood tests are requested for you today.  We'll let you know about the results.   Please come back for a follow-up appointment in 2 months.

## 2019-03-18 NOTE — Patient Instructions (Addendum)
Blood tests are requested for you today.  We'll let you know about the results.  Please come back for a follow-up appointment in 2 months.   

## 2019-04-14 LAB — COMPREHENSIVE METABOLIC PANEL
Albumin: 3.8 (ref 3.5–5.0)
Calcium: 9.2 (ref 8.7–10.7)
GFR calc Af Amer: 138
GFR calc non Af Amer: 120
Globulin: 2.5

## 2019-04-14 LAB — IRON,TIBC AND FERRITIN PANEL
%SAT: 16
Ferritin: 13
Iron: 81
TIBC: 508
UIBC: 427

## 2019-04-14 LAB — BASIC METABOLIC PANEL
BUN: 11 (ref 4–21)
CO2: 19 (ref 13–22)
Chloride: 101 (ref 99–108)
Creatinine: 0.6 (ref 0.5–1.1)
Glucose: 84
Potassium: 3.9 (ref 3.4–5.3)
Sodium: 135 — AB (ref 137–147)

## 2019-04-14 LAB — HEPATIC FUNCTION PANEL
ALT: 26 (ref 7–35)
AST: 17 (ref 13–35)
Alkaline Phosphatase: 77 (ref 25–125)

## 2019-04-14 LAB — CBC AND DIFFERENTIAL
HCT: 31 — AB (ref 36–46)
Hemoglobin: 10.7 — AB (ref 12.0–16.0)
Neutrophils Absolute: 8.4
Platelets: 331 (ref 150–399)
WBC: 11.6

## 2019-04-14 LAB — CBC: RBC: 3.53 — AB (ref 3.87–5.11)

## 2019-05-30 ENCOUNTER — Other Ambulatory Visit: Payer: Self-pay

## 2019-06-03 ENCOUNTER — Ambulatory Visit (INDEPENDENT_AMBULATORY_CARE_PROVIDER_SITE_OTHER): Payer: Managed Care, Other (non HMO) | Admitting: Endocrinology

## 2019-06-03 ENCOUNTER — Other Ambulatory Visit: Payer: Self-pay

## 2019-06-03 ENCOUNTER — Encounter: Payer: Self-pay | Admitting: Endocrinology

## 2019-06-03 VITALS — BP 126/82 | HR 100 | Ht 67.0 in | Wt 185.2 lb

## 2019-06-03 DIAGNOSIS — E209 Hypoparathyroidism, unspecified: Secondary | ICD-10-CM

## 2019-06-03 DIAGNOSIS — E559 Vitamin D deficiency, unspecified: Secondary | ICD-10-CM

## 2019-06-03 NOTE — Progress Notes (Signed)
Subjective:    Patient ID: Alisha Copeland, female    DOB: Oct 18, 1985, 33 y.o.   MRN: [id number]  HPI Pt returns for f/u of primary hypoparathyroidism (dx'ed at age 73; she has been on rocaltrol since then; at age 62, pt says she was hospitalized in ICU for ARF, due to overtreatment/hypercalcemia; she has no h/o adrenal dz, infertility, vitiligo, urolithiasis, ceilac disease, yeast infections, pernicious anemia, or liver disease; she is considering a pregnancy at some point, but hesitates, due this condition; pt says she had genetic testing many years ago, but no hereditary syndrome was found; she declines Natpara).  She takes a multivitamin, but not a dedicated vit-d supplement--just PNV. She is now [redacted] weeks pregnant.  pt states she feels well in general.  She has slight cramps in the legs and feet, but no assoc numbness.  She has gained 40 lbs so far.  She has required tums for heartburn.   Past Medical History:  Diagnosis Date  . Asthma   . Chronic kidney disease    UTI's  . GERD (gastroesophageal reflux disease)   . Hypertension   . Hypoparathyroidism ([place])   . POTS (postural orthostatic tachycardia syndrome)     No past surgical history on file.  Social History   Socioeconomic History  . Marital status: Married    Spouse name: Not on file  . Number of children: Not on file  . Years of education: Not on file  . Highest education level: Not on file  Occupational History  . Not on file  Social Needs  . Financial resource strain: Not on file  . Food insecurity    Worry: Not on file    Inability: Not on file  . Transportation needs    Medical: Not on file    Non-medical: Not on file  Tobacco Use  . Smoking status: Never Smoker  . Smokeless tobacco: Never Used  Substance and Sexual Activity  . Alcohol use: Yes    Comment: occassionaly  . Drug use: No  . Sexual activity: Yes    Birth control/protection: Pill  Lifestyle  . Physical activity    Days per week: Not on  file    Minutes per session: Not on file  . Stress: Not on file  Relationships  . Social Herbalist on phone: Not on file    Gets together: Not on file    Attends religious service: Not on file    Active member of club or organization: Not on file    Attends meetings of clubs or organizations: Not on file    Relationship status: Not on file  . Intimate partner violence    Fear of current or ex partner: Not on file    Emotionally abused: Not on file    Physically abused: Not on file    Forced sexual activity: Not on file  Other Topics Concern  . Not on file  Social History Narrative  . Not on file    Current Outpatient Medications on File Prior to Visit  Medication Sig Dispense Refill  . adapalene (DIFFERIN) 0.1 % gel Apply 1 application topically daily as needed. acne  0  . albuterol (PROVENTIL HFA;VENTOLIN HFA) 108 (90 BASE) MCG/ACT inhaler Inhale 2 puffs into the lungs every 6 (six) hours as needed for wheezing. 1 Inhaler 1  . calcitRIOL (ROCALTROL) 0.5 MCG capsule Take 4 capsules (2 mcg total) by mouth daily. 300 capsule 3  . calcium carbonate (OS-CAL)  600 MG TABS Take 600-1,200 mg by mouth 2 (two) times daily with a meal. Take 1200mg  in the morning and 600mg  at night    . EPINEPHrine 0.3 mg/0.3 mL IJ SOAJ injection Inject 0.3 mg into the muscle as directed. Inject into the muscle once as needed for anaphylaxis reaction    . labetalol (NORMODYNE) 100 MG tablet labetalol 100 mg tablet     No current facility-administered medications on file prior to visit.     Allergies  Allergen Reactions  . Beta Adrenergic Blockers     Lethargic, increased tachycardia  . Cisapride Other (See Comments)    Prolonged QT syndrome  . Methylphenidate Hcl Other (See Comments)    Increased tachycardia  . Nitrofurantoin Nausea And Vomiting  . Propranolol Other (See Comments)    Lethargy and fatigue  . Sertraline Hcl Diarrhea  . Sulfamethoxazole-Trimethoprim Nausea And Vomiting  .  Amphetamine Palpitations  . Ciprofloxacin Hives and Rash  . Dextroamphetamine Palpitations  . Labetalol Rash  . Mononessa [Norgestimate-Eth Estradiol] Rash  . Sulfa Antibiotics Rash    Other reaction(s): GI Intolerance  . Vancomycin Hives and Rash    Family History  Problem Relation Age of Onset  . Hypertension Father   . Irritable bowel syndrome Mother   . Hypertension Paternal Grandfather   . Lung cancer Maternal Grandfather   . Diabetes Maternal Grandmother   . Crohn's disease Maternal Grandmother   . Colon cancer Neg Hx   . Rectal cancer Neg Hx   . Stomach cancer Neg Hx   . Other Neg Hx        hypoparathyroidism    BP 126/82 (BP Location: Left Arm, Patient Position: Sitting, Cuff Size: Normal)   Pulse 100   Ht 5\' 7"  (1.702 m)   Wt 185 lb 3.2 oz (84 kg)   SpO2 97%   BMI 29.01 kg/m    Review of Systems She has slight ankle swelling    Objective:   Physical Exam VITAL SIGNS:  See vs page. GENERAL: no distress.  NECK: There is no palpable thyroid enlargement.  No thyroid nodule is palpable.  No palpable lymphadenopathy at the anterior neck.     Lab Results  Component Value Date   PTH 1 (L) 12/13/2018   CALCIUM 9.0 06/03/2019   CAION 0.89 (L) 12/16/2015   PHOS 4.0 01/10/2017       Assessment & Plan:  Hyponatremia: recheck today Hypoparathyroidism: well-controlled   Patient Instructions  Blood tests are requested for you today.  We'll let you know about the results.   Please come back for a follow-up appointment in 3-4 months.

## 2019-06-03 NOTE — Patient Instructions (Signed)
Blood tests are requested for you today.  We'll let you know about the results.   Please come back for a follow-up appointment in 3-4 months.

## 2019-06-04 LAB — BASIC METABOLIC PANEL
BUN: 14 mg/dL (ref 6–23)
CO2: 23 mEq/L (ref 19–32)
Calcium: 9 mg/dL (ref 8.4–10.5)
Chloride: 101 mEq/L (ref 96–112)
Creatinine, Ser: 0.62 mg/dL (ref 0.40–1.20)
GFR: 110.84 mL/min (ref >60.00)
Glucose, Bld: 108 mg/dL — ABNORMAL HIGH (ref 70–99)
Potassium: 3.5 mEq/L (ref 3.5–5.1)
Sodium: 134 mEq/L — ABNORMAL LOW (ref 135–145)

## 2019-06-04 LAB — VITAMIN D 25 HYDROXY (VIT D DEFICIENCY, FRACTURES): VITD: 28.01 ng/mL — ABNORMAL LOW (ref 30.00–100.00)

## 2019-06-14 ENCOUNTER — Other Ambulatory Visit: Payer: Self-pay | Admitting: Endocrinology

## 2019-07-10 LAB — OB RESULTS CONSOLE GBS: GBS: NEGATIVE

## 2019-07-21 ENCOUNTER — Encounter (HOSPITAL_COMMUNITY): Payer: Self-pay | Admitting: *Deleted

## 2019-07-21 ENCOUNTER — Telehealth (HOSPITAL_COMMUNITY): Payer: Self-pay | Admitting: *Deleted

## 2019-07-21 DIAGNOSIS — Z3403 Encounter for supervision of normal first pregnancy, third trimester: Secondary | ICD-10-CM

## 2019-07-21 NOTE — Telephone Encounter (Signed)
Preadmission screen  

## 2019-07-22 ENCOUNTER — Other Ambulatory Visit (HOSPITAL_COMMUNITY)
Admission: RE | Admit: 2019-07-22 | Discharge: 2019-07-22 | Disposition: A | Discharge status: Home / Self Care | Payer: Managed Care, Other (non HMO) | Source: Ambulatory Visit | Attending: Obstetrics & Gynecology | Admitting: Obstetrics & Gynecology

## 2019-07-22 ENCOUNTER — Other Ambulatory Visit: Payer: Self-pay

## 2019-07-22 DIAGNOSIS — Z20828 Contact with and (suspected) exposure to other viral communicable diseases: Secondary | ICD-10-CM | POA: Insufficient documentation

## 2019-07-22 LAB — COMPREHENSIVE METABOLIC PANEL
ALT: 31 U/L (ref 0–44)
AST: 32 U/L (ref 15–41)
Albumin: 2.5 g/dL — ABNORMAL LOW (ref 3.5–5.0)
Alkaline Phosphatase: 136 U/L — ABNORMAL HIGH (ref 38–126)
Anion gap: 11 (ref 5–15)
BUN: 12 mg/dL (ref 6–20)
CO2: 21 mmol/L — ABNORMAL LOW (ref 22–32)
Calcium: 8.2 mg/dL — ABNORMAL LOW (ref 8.9–10.3)
Chloride: 104 mmol/L (ref 98–111)
Creatinine, Ser: 0.73 mg/dL (ref 0.44–1.00)
GFR calc Af Amer: >60 mL/min (ref >60)
GFR calc non Af Amer: >60 mL/min (ref >60)
Glucose, Bld: 130 mg/dL — ABNORMAL HIGH (ref 70–99)
Potassium: 3.4 mmol/L — ABNORMAL LOW (ref 3.5–5.1)
Sodium: 136 mmol/L (ref 135–145)
Total Bilirubin: 0.3 mg/dL (ref 0.3–1.2)
Total Protein: 5.6 g/dL — ABNORMAL LOW (ref 6.5–8.1)

## 2019-07-22 LAB — CBC
HCT: 32.5 % — ABNORMAL LOW (ref 36.0–46.0)
Hemoglobin: 10.7 g/dL — ABNORMAL LOW (ref 12.0–15.0)
MCH: 29.5 pg (ref 26.0–34.0)
MCHC: 32.9 g/dL (ref 30.0–36.0)
MCV: 89.5 fL (ref 80.0–100.0)
Platelets: 240 10*3/uL (ref 150–400)
RBC: 3.63 MIL/uL — ABNORMAL LOW (ref 3.87–5.11)
RDW: 17.1 % — ABNORMAL HIGH (ref 11.5–15.5)
WBC: 7.1 10*3/uL (ref 4.0–10.5)
nRBC: 0 % (ref 0.0–0.2)

## 2019-07-22 LAB — RPR: RPR Ser Ql: NONREACTIVE

## 2019-07-22 LAB — SARS CORONAVIRUS 2 BY RT PCR (HOSPITAL ORDER, PERFORMED IN ~~LOC~~ HOSPITAL LAB): SARS Coronavirus 2: NEGATIVE

## 2019-07-22 NOTE — MAU Note (Signed)
Pt denies any Covid-19 symptoms.

## 2019-07-24 ENCOUNTER — Encounter (HOSPITAL_COMMUNITY): Payer: Self-pay

## 2019-07-24 ENCOUNTER — Inpatient Hospital Stay (HOSPITAL_COMMUNITY): Payer: Managed Care, Other (non HMO) | Admitting: Anesthesiology

## 2019-07-24 ENCOUNTER — Other Ambulatory Visit: Payer: Self-pay

## 2019-07-24 ENCOUNTER — Inpatient Hospital Stay (HOSPITAL_COMMUNITY)
Admission: AD | Admit: 2019-07-24 | Discharge: 2019-07-28 | DRG: 786 | Disposition: A | Discharge status: Home / Self Care | Payer: Managed Care, Other (non HMO) | Attending: Obstetrics and Gynecology | Admitting: Obstetrics and Gynecology

## 2019-07-24 ENCOUNTER — Inpatient Hospital Stay (HOSPITAL_COMMUNITY): Payer: Managed Care, Other (non HMO)

## 2019-07-24 DIAGNOSIS — O9902 Anemia complicating childbirth: Secondary | ICD-10-CM | POA: Diagnosis present

## 2019-07-24 DIAGNOSIS — Z20828 Contact with and (suspected) exposure to other viral communicable diseases: Secondary | ICD-10-CM | POA: Diagnosis present

## 2019-07-24 DIAGNOSIS — E209 Hypoparathyroidism, unspecified: Secondary | ICD-10-CM | POA: Diagnosis present

## 2019-07-24 DIAGNOSIS — D509 Iron deficiency anemia, unspecified: Secondary | ICD-10-CM | POA: Diagnosis present

## 2019-07-24 DIAGNOSIS — O1002 Pre-existing essential hypertension complicating childbirth: Secondary | ICD-10-CM | POA: Diagnosis present

## 2019-07-24 DIAGNOSIS — Z3403 Encounter for supervision of normal first pregnancy, third trimester: Secondary | ICD-10-CM

## 2019-07-24 DIAGNOSIS — O411230 Chorioamnionitis, third trimester, not applicable or unspecified: Secondary | ICD-10-CM | POA: Diagnosis present

## 2019-07-24 DIAGNOSIS — Z3A38 38 weeks gestation of pregnancy: Secondary | ICD-10-CM

## 2019-07-24 DIAGNOSIS — Z3490 Encounter for supervision of normal pregnancy, unspecified, unspecified trimester: Secondary | ICD-10-CM

## 2019-07-24 DIAGNOSIS — A1801 Tuberculosis of spine: Secondary | ICD-10-CM | POA: Diagnosis present

## 2019-07-24 DIAGNOSIS — O99892 Other specified diseases and conditions complicating childbirth: Secondary | ICD-10-CM | POA: Diagnosis present

## 2019-07-24 LAB — CBC
HCT: 34.5 % — ABNORMAL LOW (ref 36.0–46.0)
Hemoglobin: 11.9 g/dL — ABNORMAL LOW (ref 12.0–15.0)
MCH: 30.4 pg (ref 26.0–34.0)
MCHC: 34.5 g/dL (ref 30.0–36.0)
MCV: 88.2 fL (ref 80.0–100.0)
Platelets: 234 10*3/uL (ref 150–400)
RBC: 3.91 MIL/uL (ref 3.87–5.11)
RDW: 17.4 % — ABNORMAL HIGH (ref 11.5–15.5)
WBC: 14.5 10*3/uL — ABNORMAL HIGH (ref 4.0–10.5)
nRBC: 0 % (ref 0.0–0.2)

## 2019-07-24 MED ORDER — TERBUTALINE SULFATE 1 MG/ML IJ SOLN: 0.2500 mg | Freq: Once | INTRAMUSCULAR | Status: DC | PRN

## 2019-07-24 MED ORDER — OXYCODONE-ACETAMINOPHEN 5-325 MG PO TABS: 1.0000 | ORAL_TABLET | ORAL | Status: DC | PRN

## 2019-07-24 MED ORDER — OXYTOCIN BOLUS FROM INFUSION: 500.0000 mL | Freq: Once | INTRAVENOUS | Status: DC

## 2019-07-24 MED ORDER — LIDOCAINE HCL (PF) 1 % IJ SOLN: 30.0000 mL | INTRAMUSCULAR | Status: DC | PRN

## 2019-07-24 MED ORDER — EPHEDRINE 5 MG/ML INJ: 10.0000 mg | INTRAVENOUS | Status: DC | PRN

## 2019-07-24 MED ORDER — FENTANYL CITRATE (PF) 100 MCG/2ML IJ SOLN
50.0000 ug | INTRAMUSCULAR | Status: DC | PRN
  Administered 2019-07-24: 50 ug via INTRAVENOUS
  Administered 2019-07-24 (×2): 100 ug via INTRAVENOUS
  Administered 2019-07-24: 21:00:00 50 ug via INTRAVENOUS
  Filled 2019-07-24 (×4): qty 2

## 2019-07-24 MED ORDER — ACETAMINOPHEN 325 MG PO TABS
650.0000 mg | ORAL_TABLET | ORAL | Status: DC | PRN
  Administered 2019-07-25: 650 mg via ORAL
  Filled 2019-07-24: qty 2

## 2019-07-24 MED ORDER — PHENYLEPHRINE 40 MCG/ML (10ML) SYRINGE FOR IV PUSH (FOR BLOOD PRESSURE SUPPORT)
80.0000 ug | PREFILLED_SYRINGE | INTRAVENOUS | Status: DC | PRN
  Filled 2019-07-24: qty 10

## 2019-07-24 MED ORDER — FENTANYL-BUPIVACAINE-NACL 0.5-0.125-0.9 MG/250ML-% EP SOLN
12.0000 mL/h | EPIDURAL | Status: DC | PRN
  Administered 2019-07-25: 12 mL/h via EPIDURAL
  Filled 2019-07-24 (×2): qty 250

## 2019-07-24 MED ORDER — LACTATED RINGERS IV SOLN: 500.0000 mL | INTRAVENOUS | Status: DC | PRN

## 2019-07-24 MED ORDER — LACTATED RINGERS IV SOLN
INTRAVENOUS | Status: DC
  Administered 2019-07-24 – 2019-07-25 (×4): via INTRAVENOUS

## 2019-07-24 MED ORDER — LIDOCAINE HCL (PF) 1 % IJ SOLN
INTRAMUSCULAR | Status: DC | PRN
  Administered 2019-07-24: 10 mL via EPIDURAL
  Administered 2019-07-24: 2 mL via EPIDURAL

## 2019-07-24 MED ORDER — ZOLPIDEM TARTRATE 5 MG PO TABS: 5.0000 mg | ORAL_TABLET | Freq: Every evening | ORAL | Status: DC | PRN

## 2019-07-24 MED ORDER — LACTATED RINGERS IV SOLN
500.0000 mL | Freq: Once | INTRAVENOUS | Status: AC
  Administered 2019-07-24: 20:00:00 500 mL via INTRAVENOUS

## 2019-07-24 MED ORDER — OXYTOCIN 40 UNITS IN NORMAL SALINE INFUSION - SIMPLE MED: 2.5000 [IU]/h | INTRAVENOUS | Status: DC

## 2019-07-24 MED ORDER — OXYTOCIN 40 UNITS IN NORMAL SALINE INFUSION - SIMPLE MED
1.0000 m[IU]/min | INTRAVENOUS | Status: DC
  Administered 2019-07-24: 2 m[IU]/min via INTRAVENOUS
  Filled 2019-07-24: qty 1000

## 2019-07-24 MED ORDER — PHENYLEPHRINE 40 MCG/ML (10ML) SYRINGE FOR IV PUSH (FOR BLOOD PRESSURE SUPPORT): 80.0000 ug | PREFILLED_SYRINGE | INTRAVENOUS | Status: DC | PRN

## 2019-07-24 MED ORDER — SOD CITRATE-CITRIC ACID 500-334 MG/5ML PO SOLN
30.0000 mL | ORAL | Status: DC | PRN
  Administered 2019-07-25: 30 mL via ORAL
  Filled 2019-07-24 (×2): qty 30

## 2019-07-24 MED ORDER — MISOPROSTOL 25 MCG QUARTER TABLET
25.0000 ug | ORAL_TABLET | ORAL | Status: DC | PRN
  Administered 2019-07-24 (×4): 25 ug via VAGINAL
  Filled 2019-07-24 (×4): qty 1

## 2019-07-24 MED ORDER — DIPHENHYDRAMINE HCL 50 MG/ML IJ SOLN: 12.5000 mg | INTRAMUSCULAR | Status: DC | PRN

## 2019-07-24 MED ORDER — ONDANSETRON HCL 4 MG/2ML IJ SOLN
4.0000 mg | Freq: Four times a day (QID) | INTRAMUSCULAR | Status: DC | PRN
  Administered 2019-07-25: 4 mg via INTRAVENOUS
  Filled 2019-07-24: qty 2

## 2019-07-24 MED ORDER — OXYCODONE-ACETAMINOPHEN 5-325 MG PO TABS: 2.0000 | ORAL_TABLET | ORAL | Status: DC | PRN

## 2019-07-24 MED ORDER — SODIUM CHLORIDE (PF) 0.9 % IJ SOLN
INTRAMUSCULAR | Status: DC | PRN
  Administered 2019-07-24: 12 mL/h via EPIDURAL

## 2019-07-24 NOTE — H&P (Signed)
Alisha Copeland is a 33 y.o. female G1 at 52 weeks presenting for IOL secondary to [facility] on labetalol 200 BID.  She denies HA, CP/SOB, RUQ pain, and visual disturbance.  Additionally, the patient has hypoparathyroidism and has been stable this pregnancy; managed by Dr. Loanne Drilling ([facility]).  Patient has iron deficiency anemia and is s/p iron infusion on 10/2.  Patient also has POTS; stable this pregnancy on no medication.  GBS negative.  Patient received 2nd dose of VMP at 0448 this am.  She feels mild CTX.  Active FM.  OB History    Gravida  2   Para      Term      Preterm      AB  1   Living        SAB  1   TAB      Ectopic      Multiple      Live Births             Past Medical History:  Diagnosis Date  . Anemia   . Asthma   . Chronic kidney disease    UTI's  . GERD (gastroesophageal reflux disease)   . Hypertension   . Hypoparathyroidism ([place])   . POTS (postural orthostatic tachycardia syndrome)    History reviewed. No pertinent surgical history. Family History: family history includes Crohn's disease in her maternal grandmother; Diabetes in her maternal grandmother; Hypertension in her father and paternal grandfather; Irritable bowel syndrome in her mother; Lung cancer in her maternal grandfather. Social History:  reports that she has never smoked. She has never used smokeless tobacco. She reports current alcohol use. She reports that she does not use drugs.     Maternal Diabetes: No Genetic Screening: Normal Maternal Ultrasounds/Referrals: Normal Fetal Ultrasounds or other Referrals:  None Maternal Substance Abuse:  No Significant Maternal Medications:  Meds include: Other: labetalol Significant Maternal Lab Results:  Group B Strep negative Other Comments:  None  ROS Maternal Medical History:  Fetal activity: Perceived fetal activity is normal.   Last perceived fetal movement was within the past hour.    Prenatal complications: PIH.   Prenatal  Complications - Diabetes: none.    Dilation: Closed Effacement (%): Thick Station: -3 Exam by:: Lexie Ament, RN Blood pressure (!) 143/92, pulse 85, temperature 98 F (36.7 C), temperature source Oral, resp. rate 16, height 5\' 7"  (1.702 m), weight 90.8 kg, last menstrual period 10/30/2018. Maternal Exam:  Uterine Assessment: Contraction strength is mild.  Contraction frequency is rare.   Abdomen: Patient reports no abdominal tenderness. Fundal height is c/w dates.   Estimated fetal weight is 8#.       Fetal Exam Fetal Monitor Review: Baseline rate: 120.  Variability: moderate (6-25 bpm).   Pattern: no decelerations and accelerations present.    Fetal State Assessment: Category I - tracings are normal.     Physical Exam  Constitutional: She is oriented to person, place, and time. She appears well-developed and well-nourished.  Respiratory: Effort normal.  GI: Soft. There is no rebound and no guarding.  Neurological: She is alert and oriented to person, place, and time.  Skin: Skin is warm and dry.  Psychiatric: She has a normal mood and affect. Her behavior is normal.    Prenatal labs: ABO, Rh: --/--/A NEG (10/06 0820) Antibody: POS (10/06 0820) Rubella: Immune (04/03 0000) RPR: NON REACTIVE (10/06 0821)  HBsAg: Negative (04/03 0000)  HIV: Non-reactive (04/03 0000)  GBS: Negative/-- (09/24 0000)  Assessment/Plan: 33yo G1 at [redacted]w[redacted]d for IOL secondary to Sc[facility]x on medication -Continue VMP -CLEA when desired -CHTN-treat with IV labetalol and magnesium sulfate prn; no symptoms of pre-E at this time -Anticipate NSVD   Linda Hedges 07/24/2019, 7:20 AM

## 2019-07-24 NOTE — Progress Notes (Signed)
Alisha Copeland is a 33 y.o. G2P0010 at [redacted]w[redacted]d by ultrasound admitted for induction of labor due to Hypertension.  Subjective: Feeling mild CTX.  Active FM  Objective: BP 137/90   Pulse 87   Temp 98 F (36.7 C) (Axillary)   Resp 18   Ht 5\' 7"  (1.702 m)   Wt 90.8 kg   LMP 10/30/2018   BMI 31.36 kg/m  No intake/output data recorded. No intake/output data recorded.  FHT:  FHR: 130 bpm, variability: moderate,  accelerations:  Present,  decelerations:  Absent UC:   regular, every 2 minutes SVE:   Dilation: Fingertip Effacement (%): Thick Station: -3 Exam by:: Casilda Carls RN  Foley bulb placed with 60 cc NS  Labs: Lab Results  Component Value Date   WBC 7.1 07/22/2019   HGB 10.7 (L) 07/22/2019   HCT 32.5 (L) 07/22/2019   MCV 89.5 07/22/2019   PLT 240 07/22/2019    Assessment / Plan: IOL progressing well on VMP, Foley bulb added  Labor: Progressing normally.  Will add pitocin 4 h after VMP placed. Preeclampsia:  n/a Fetal Wellbeing:  Category I Pain Control:  Labor support without medications I/D:  n/a Anticipated MOD:  NSVD  Linda Hedges 07/24/2019, 1:58 PM

## 2019-07-24 NOTE — Anesthesia Procedure Notes (Signed)
Epidural Patient location during procedure: OB Start time: 07/24/2019 9:02 PM End time: 07/24/2019 9:12 PM  Staffing Anesthesiologist: Pervis Hocking, DO Performed: anesthesiologist   Preanesthetic Checklist Completed: patient identified, pre-op evaluation, timeout performed, IV checked, risks and benefits discussed and monitors and equipment checked  Epidural Patient position: sitting Prep: site prepped and draped and DuraPrep Patient monitoring: continuous pulse ox, blood pressure, heart rate and cardiac monitor Approach: midline Location: L3-L4 Injection technique: LOR air  Needle:  Needle type: Tuohy  Needle gauge: 17 G Needle length: 9 cm Needle insertion depth: 5 cm Catheter type: closed end flexible Catheter size: 19 Gauge Catheter at skin depth: 11 cm Test dose: negative  Assessment Sensory level: T8 Events: blood not aspirated, injection not painful, no injection resistance, negative IV test and no paresthesia  Additional Notes Patient identified. Risks/Benefits/Options discussed with patient including but not limited to bleeding, infection, nerve damage, paralysis, failed block, incomplete pain control, headache, blood pressure changes, nausea, vomiting, reactions to medication both or allergic, itching and postpartum back pain. Confirmed with bedside nurse the patient's most recent platelet count. Confirmed with patient that they are not currently taking any anticoagulation, have any bleeding history or any family history of bleeding disorders. Patient expressed understanding and wished to proceed. All questions were answered. Sterile technique was used throughout the entire procedure. Please see nursing notes for vital signs. Test dose was given through epidural catheter and negative prior to continuing to dose epidural or start infusion. Warning signs of high block given to the patient including shortness of breath, tingling/numbness in hands, complete motor  block, or any concerning symptoms with instructions to call for help. Patient was given instructions on fall risk and not to get out of bed. All questions and concerns addressed with instructions to call with any issues or inadequate analgesia.  Reason for block:procedure for pain

## 2019-07-24 NOTE — Anesthesia Preprocedure Evaluation (Signed)
Anesthesia Evaluation  Patient identified by MRN, date of birth, ID band Patient awake    Reviewed: Allergy & Precautions, NPO status , Patient's Chart, lab work & pertinent test results  Airway Mallampati: II  TM Distance: >3 FB Neck ROM: Full    Dental no notable dental hx.    Pulmonary asthma ,    Pulmonary exam normal breath sounds clear to auscultation       Cardiovascular hypertension, Pt. on medications and Pt. on home beta blockers Normal cardiovascular exam Rhythm:Regular Rate:Normal  POTS   Neuro/Psych  Headaches, PSYCHIATRIC DISORDERS Depression    GI/Hepatic Neg liver ROS, GERD  ,  Endo/Other  hypoparathyroidism  Renal/GU negative Renal ROS  negative genitourinary   Musculoskeletal negative musculoskeletal ROS (+)   Abdominal   Peds negative pediatric ROS (+)  Hematology  (+) anemia , Iron deficiency anemia s/p iron transfusion 07/18/19   Anesthesia Other Findings   Reproductive/Obstetrics (+) Pregnancy                             Anesthesia Physical Anesthesia Plan  ASA: III  Anesthesia Plan: Epidural   Post-op Pain Management:    Induction:   PONV Risk Score and Plan: 2  Airway Management Planned: Natural Airway  Additional Equipment: None  Intra-op Plan:   Post-operative Plan:   Informed Consent: I have reviewed the patients History and Physical, chart, labs and discussed the procedure including the risks, benefits and alternatives for the proposed anesthesia with the patient or authorized representative who has indicated his/her understanding and acceptance.       Plan Discussed with:   Anesthesia Plan Comments:         Anesthesia Quick Evaluation

## 2019-07-25 ENCOUNTER — Encounter (HOSPITAL_COMMUNITY)
Admission: AD | Disposition: A | Discharge status: Home / Self Care | Payer: Self-pay | Source: Home / Self Care | Attending: Obstetrics and Gynecology

## 2019-07-25 SURGERY — Surgical Case
Anesthesia: Epidural

## 2019-07-25 MED ORDER — DIPHENHYDRAMINE HCL 50 MG/ML IJ SOLN: 12.5000 mg | INTRAMUSCULAR | Status: DC | PRN

## 2019-07-25 MED ORDER — CLINDAMYCIN PHOSPHATE 900 MG/50ML IV SOLN
900.0000 mg | Freq: Three times a day (TID) | INTRAVENOUS | Status: AC
  Administered 2019-07-26 (×2): 900 mg via INTRAVENOUS
  Filled 2019-07-25 (×2): qty 50

## 2019-07-25 MED ORDER — SODIUM CHLORIDE 0.9 % IV SOLN
INTRAVENOUS | Status: DC | PRN
  Administered 2019-07-25: 18:00:00 via INTRAVENOUS

## 2019-07-25 MED ORDER — HYDROMORPHONE HCL 1 MG/ML IJ SOLN
0.2500 mg | INTRAMUSCULAR | Status: DC | PRN
  Administered 2019-07-25 (×2): 0.5 mg via INTRAVENOUS

## 2019-07-25 MED ORDER — HYDROMORPHONE HCL 1 MG/ML IJ SOLN: 0.2000 mg | INTRAMUSCULAR | Status: DC | PRN

## 2019-07-25 MED ORDER — MORPHINE SULFATE (PF) 0.5 MG/ML IJ SOLN
INTRAMUSCULAR | Status: AC
  Filled 2019-07-25: qty 10

## 2019-07-25 MED ORDER — NALOXONE HCL 4 MG/10ML IJ SOLN
1.0000 ug/kg/h | INTRAVENOUS | Status: DC | PRN
  Filled 2019-07-25: qty 5

## 2019-07-25 MED ORDER — KETOROLAC TROMETHAMINE 30 MG/ML IJ SOLN
30.0000 mg | Freq: Four times a day (QID) | INTRAMUSCULAR | Status: AC
  Administered 2019-07-26 (×3): 30 mg via INTRAVENOUS
  Filled 2019-07-25 (×3): qty 1

## 2019-07-25 MED ORDER — ZOLPIDEM TARTRATE 5 MG PO TABS: 5.0000 mg | ORAL_TABLET | Freq: Every evening | ORAL | Status: DC | PRN

## 2019-07-25 MED ORDER — OXYCODONE HCL 5 MG/5ML PO SOLN: 5.0000 mg | Freq: Once | ORAL | Status: DC | PRN

## 2019-07-25 MED ORDER — DIPHENHYDRAMINE HCL 25 MG PO CAPS: 25.0000 mg | ORAL_CAPSULE | Freq: Four times a day (QID) | ORAL | Status: DC | PRN

## 2019-07-25 MED ORDER — TETANUS-DIPHTH-ACELL PERTUSSIS 5-2.5-18.5 LF-MCG/0.5 IM SUSP: 0.5000 mL | Freq: Once | INTRAMUSCULAR | Status: DC

## 2019-07-25 MED ORDER — DIBUCAINE (PERIANAL) 1 % EX OINT: 1.0000 "application " | TOPICAL_OINTMENT | CUTANEOUS | Status: DC | PRN

## 2019-07-25 MED ORDER — ONDANSETRON HCL 4 MG/2ML IJ SOLN
INTRAMUSCULAR | Status: AC
  Filled 2019-07-25: qty 2

## 2019-07-25 MED ORDER — NALBUPHINE SYRINGE 5 MG/0.5 ML
5.0000 mg | INJECTION | Freq: Once | INTRAMUSCULAR | Status: DC | PRN
  Filled 2019-07-25: qty 0.5

## 2019-07-25 MED ORDER — OXYTOCIN 40 UNITS IN NORMAL SALINE INFUSION - SIMPLE MED
INTRAVENOUS | Status: AC
  Filled 2019-07-25: qty 1000

## 2019-07-25 MED ORDER — GENTAMICIN SULFATE 40 MG/ML IJ SOLN
5.0000 mg/kg | INTRAVENOUS | Status: DC
  Administered 2019-07-25: 450 mg via INTRAVENOUS
  Filled 2019-07-25: qty 11.25

## 2019-07-25 MED ORDER — SENNOSIDES-DOCUSATE SODIUM 8.6-50 MG PO TABS
2.0000 | ORAL_TABLET | ORAL | Status: DC
  Administered 2019-07-25: 2 via ORAL
  Filled 2019-07-25: qty 2

## 2019-07-25 MED ORDER — OXYCODONE HCL 5 MG PO TABS: 5.0000 mg | ORAL_TABLET | Freq: Once | ORAL | Status: DC | PRN

## 2019-07-25 MED ORDER — DEXAMETHASONE SODIUM PHOSPHATE 10 MG/ML IJ SOLN
INTRAMUSCULAR | Status: AC
  Filled 2019-07-25: qty 1

## 2019-07-25 MED ORDER — NALBUPHINE SYRINGE 5 MG/0.5 ML
5.0000 mg | INJECTION | INTRAMUSCULAR | Status: DC | PRN
  Filled 2019-07-25: qty 0.5

## 2019-07-25 MED ORDER — SCOPOLAMINE 1 MG/3DAYS TD PT72
MEDICATED_PATCH | TRANSDERMAL | Status: DC | PRN
  Administered 2019-07-25: 1 via TRANSDERMAL

## 2019-07-25 MED ORDER — HYDROMORPHONE HCL 1 MG/ML IJ SOLN
INTRAMUSCULAR | Status: AC
  Filled 2019-07-25: qty 0.5

## 2019-07-25 MED ORDER — OXYCODONE HCL 5 MG PO TABS
5.0000 mg | ORAL_TABLET | ORAL | Status: DC | PRN
  Administered 2019-07-26 – 2019-07-27 (×3): 5 mg via ORAL
  Filled 2019-07-25 (×3): qty 1

## 2019-07-25 MED ORDER — SODIUM CHLORIDE 0.9 % IV SOLN
2.0000 g | Freq: Four times a day (QID) | INTRAVENOUS | Status: DC
  Administered 2019-07-25: 2 g via INTRAVENOUS
  Filled 2019-07-25 (×3): qty 2000

## 2019-07-25 MED ORDER — LACTATED RINGERS IV SOLN
INTRAVENOUS | Status: DC
  Administered 2019-07-25: via INTRAVENOUS

## 2019-07-25 MED ORDER — LACTATED RINGERS IV SOLN
INTRAVENOUS | Status: DC | PRN
  Administered 2019-07-25 (×2): via INTRAVENOUS

## 2019-07-25 MED ORDER — CLINDAMYCIN PHOSPHATE 900 MG/50ML IV SOLN
900.0000 mg | Freq: Once | INTRAVENOUS | Status: AC
  Administered 2019-07-25: 900 mg via INTRAVENOUS

## 2019-07-25 MED ORDER — MENTHOL 3 MG MT LOZG: 1.0000 | LOZENGE | OROMUCOSAL | Status: DC | PRN

## 2019-07-25 MED ORDER — SCOPOLAMINE 1 MG/3DAYS TD PT72: 1.0000 | MEDICATED_PATCH | Freq: Once | TRANSDERMAL | Status: DC

## 2019-07-25 MED ORDER — LABETALOL HCL 200 MG PO TABS
200.0000 mg | ORAL_TABLET | Freq: Two times a day (BID) | ORAL | Status: DC
  Administered 2019-07-25: 200 mg via ORAL
  Filled 2019-07-25: qty 1

## 2019-07-25 MED ORDER — CLINDAMYCIN PHOSPHATE 900 MG/50ML IV SOLN
INTRAVENOUS | Status: AC
  Filled 2019-07-25: qty 50

## 2019-07-25 MED ORDER — LIDOCAINE-EPINEPHRINE (PF) 2 %-1:200000 IJ SOLN
INTRAMUSCULAR | Status: AC
  Filled 2019-07-25: qty 10

## 2019-07-25 MED ORDER — SIMETHICONE 80 MG PO CHEW
80.0000 mg | CHEWABLE_TABLET | Freq: Three times a day (TID) | ORAL | Status: DC
  Administered 2019-07-26 – 2019-07-28 (×8): 80 mg via ORAL
  Filled 2019-07-25 (×8): qty 1

## 2019-07-25 MED ORDER — WITCH HAZEL-GLYCERIN EX PADS: 1.0000 "application " | MEDICATED_PAD | CUTANEOUS | Status: DC | PRN

## 2019-07-25 MED ORDER — OXYTOCIN 40 UNITS IN NORMAL SALINE INFUSION - SIMPLE MED: 2.5000 [IU]/h | INTRAVENOUS | Status: AC

## 2019-07-25 MED ORDER — ONDANSETRON HCL 4 MG/2ML IJ SOLN: 4.0000 mg | Freq: Three times a day (TID) | INTRAMUSCULAR | Status: DC | PRN

## 2019-07-25 MED ORDER — NALOXONE HCL 0.4 MG/ML IJ SOLN: 0.4000 mg | INTRAMUSCULAR | Status: DC | PRN

## 2019-07-25 MED ORDER — KETOROLAC TROMETHAMINE 30 MG/ML IJ SOLN
30.0000 mg | Freq: Once | INTRAMUSCULAR | Status: AC | PRN
  Administered 2019-07-25: 30 mg via INTRAVENOUS

## 2019-07-25 MED ORDER — COCONUT OIL OIL: 1.0000 "application " | TOPICAL_OIL | Status: DC | PRN

## 2019-07-25 MED ORDER — LIDOCAINE-EPINEPHRINE (PF) 2 %-1:200000 IJ SOLN
INTRAMUSCULAR | Status: DC | PRN
  Administered 2019-07-25: 5 mL via EPIDURAL
  Administered 2019-07-25: 10 mL via EPIDURAL
  Administered 2019-07-25: 5 mL via EPIDURAL

## 2019-07-25 MED ORDER — ALBUTEROL SULFATE (2.5 MG/3ML) 0.083% IN NEBU: 3.0000 mL | INHALATION_SOLUTION | Freq: Four times a day (QID) | RESPIRATORY_TRACT | Status: DC | PRN

## 2019-07-25 MED ORDER — ONDANSETRON HCL 4 MG/2ML IJ SOLN: 4.0000 mg | Freq: Once | INTRAMUSCULAR | Status: DC | PRN

## 2019-07-25 MED ORDER — ONDANSETRON HCL 4 MG/2ML IJ SOLN
INTRAMUSCULAR | Status: DC | PRN
  Administered 2019-07-25: 4 mg via INTRAVENOUS

## 2019-07-25 MED ORDER — DIPHENHYDRAMINE HCL 25 MG PO CAPS: 25.0000 mg | ORAL_CAPSULE | ORAL | Status: DC | PRN

## 2019-07-25 MED ORDER — SCOPOLAMINE 1 MG/3DAYS TD PT72
MEDICATED_PATCH | TRANSDERMAL | Status: AC
  Filled 2019-07-25: qty 1

## 2019-07-25 MED ORDER — SIMETHICONE 80 MG PO CHEW
80.0000 mg | CHEWABLE_TABLET | ORAL | Status: DC
  Administered 2019-07-25 – 2019-07-28 (×3): 80 mg via ORAL
  Filled 2019-07-25 (×3): qty 1

## 2019-07-25 MED ORDER — SODIUM CHLORIDE 0.9 % IV SOLN
INTRAVENOUS | Status: DC | PRN
  Administered 2019-07-25: 40 [IU] via INTRAVENOUS

## 2019-07-25 MED ORDER — ACETAMINOPHEN 500 MG PO TABS
1000.0000 mg | ORAL_TABLET | Freq: Four times a day (QID) | ORAL | Status: DC
  Administered 2019-07-25 – 2019-07-28 (×10): 1000 mg via ORAL
  Filled 2019-07-25 (×11): qty 2

## 2019-07-25 MED ORDER — PRENATAL MULTIVITAMIN CH
1.0000 | ORAL_TABLET | Freq: Every day | ORAL | Status: DC
  Administered 2019-07-26 – 2019-07-28 (×3): 1 via ORAL
  Filled 2019-07-25 (×3): qty 1

## 2019-07-25 MED ORDER — SODIUM CHLORIDE 0.9% FLUSH: 3.0000 mL | INTRAVENOUS | Status: DC | PRN

## 2019-07-25 MED ORDER — IBUPROFEN 800 MG PO TABS
800.0000 mg | ORAL_TABLET | Freq: Four times a day (QID) | ORAL | Status: DC
  Administered 2019-07-26 – 2019-07-28 (×7): 800 mg via ORAL
  Filled 2019-07-25 (×7): qty 1

## 2019-07-25 MED ORDER — LABETALOL HCL 200 MG PO TABS
200.0000 mg | ORAL_TABLET | Freq: Two times a day (BID) | ORAL | Status: DC
  Administered 2019-07-25 – 2019-07-26 (×2): 200 mg via ORAL
  Filled 2019-07-25 (×2): qty 1

## 2019-07-25 MED ORDER — SIMETHICONE 80 MG PO CHEW: 80.0000 mg | CHEWABLE_TABLET | ORAL | Status: DC | PRN

## 2019-07-25 MED ORDER — MORPHINE SULFATE (PF) 0.5 MG/ML IJ SOLN
INTRAMUSCULAR | Status: DC | PRN
  Administered 2019-07-25: 3 mg via EPIDURAL

## 2019-07-25 MED ORDER — KETOROLAC TROMETHAMINE 30 MG/ML IJ SOLN
INTRAMUSCULAR | Status: AC
  Filled 2019-07-25: qty 1

## 2019-07-25 SURGICAL SUPPLY — 37 items
BENZOIN TINCTURE PRP APPL 2/3 (GAUZE/BANDAGES/DRESSINGS) ×2 IMPLANT
CHLORAPREP W/TINT 26ML (MISCELLANEOUS) ×2 IMPLANT
CLAMP CORD UMBIL (MISCELLANEOUS) IMPLANT
CLOTH BEACON ORANGE TIMEOUT ST (SAFETY) ×2 IMPLANT
CLSR STERI-STRIP ANTIMIC 1/2X4 (GAUZE/BANDAGES/DRESSINGS) ×2 IMPLANT
DERMABOND ADVANCED (GAUZE/BANDAGES/DRESSINGS) ×1
DERMABOND ADVANCED .7 DNX12 (GAUZE/BANDAGES/DRESSINGS) ×1 IMPLANT
DRSG OPSITE POSTOP 4X10 (GAUZE/BANDAGES/DRESSINGS) ×2 IMPLANT
DRSG PAD ABDOMINAL 8X10 ST (GAUZE/BANDAGES/DRESSINGS) ×2 IMPLANT
ELECT REM PT RETURN 9FT ADLT (ELECTROSURGICAL) ×2
ELECTRODE REM PT RTRN 9FT ADLT (ELECTROSURGICAL) ×1 IMPLANT
EXTRACTOR VACUUM KIWI (MISCELLANEOUS) IMPLANT
GLOVE BIO SURGEON STRL SZ 6.5 (GLOVE) ×2 IMPLANT
GLOVE BIOGEL PI IND STRL 6.5 (GLOVE) ×1 IMPLANT
GLOVE BIOGEL PI IND STRL 7.0 (GLOVE) ×2 IMPLANT
GLOVE BIOGEL PI INDICATOR 6.5 (GLOVE) ×1
GLOVE BIOGEL PI INDICATOR 7.0 (GLOVE) ×2
GOWN STRL REUS W/TWL LRG LVL3 (GOWN DISPOSABLE) ×4 IMPLANT
HEMOSTAT ARISTA ABSORB 3G PWDR (HEMOSTASIS) ×2 IMPLANT
KIT ABG SYR 3ML LUER SLIP (SYRINGE) ×2 IMPLANT
NEEDLE HYPO 25X5/8 SAFETYGLIDE (NEEDLE) ×2 IMPLANT
NS IRRIG 1000ML POUR BTL (IV SOLUTION) ×2 IMPLANT
PACK C SECTION WH (CUSTOM PROCEDURE TRAY) ×2 IMPLANT
PAD OB MATERNITY 4.3X12.25 (PERSONAL CARE ITEMS) ×2 IMPLANT
PENCIL SMOKE EVAC W/HOLSTER (ELECTROSURGICAL) ×2 IMPLANT
SPONGE LAP 18X18 RF (DISPOSABLE) ×6 IMPLANT
SUT MNCRL AB 4-0 PS2 18 (SUTURE) ×4 IMPLANT
SUT PLAIN 0 NONE (SUTURE) IMPLANT
SUT PLAIN 2 0 (SUTURE) ×1
SUT PLAIN ABS 2-0 CT1 27XMFL (SUTURE) ×1 IMPLANT
SUT VIC AB 0 CT1 36 (SUTURE) ×2 IMPLANT
SUT VIC AB 0 CTX 36 (SUTURE) ×4
SUT VIC AB 0 CTX36XBRD ANBCTRL (SUTURE) ×4 IMPLANT
SUT VIC AB 4-0 PS2 27 (SUTURE) ×2 IMPLANT
TOWEL OR 17X24 6PK STRL BLUE (TOWEL DISPOSABLE) ×2 IMPLANT
TRAY FOLEY W/BAG SLVR 14FR LF (SET/KITS/TRAYS/PACK) IMPLANT
WATER STERILE IRR 1000ML POUR (IV SOLUTION) ×2 IMPLANT

## 2019-07-25 NOTE — Transfer of Care (Signed)
Immediate Anesthesia Transfer of Care Note  Patient: Alisha Copeland  Procedure(s) Performed: CESAREAN SECTION (N/A )  Patient Location: PACU  Anesthesia Type:Epidural  Level of Consciousness: awake  Airway & Oxygen Therapy: Patient Spontanous Breathing  Post-op Assessment: Report given to RN and Post -op Vital signs reviewed and stable  Post vital signs: stable  Last Vitals:  Vitals Value Taken Time  BP 118/58 07/25/19 1900  Temp    Pulse 110 07/25/19 1902  Resp 20 07/25/19 1902  SpO2 97 % 07/25/19 1902  Vitals shown include unvalidated device data.  Last Pain:  Vitals:   07/25/19 1630  TempSrc: Axillary  PainSc:          Complications: No apparent anesthesia complications

## 2019-07-25 NOTE — Progress Notes (Signed)
C/c/+2, DOP Push

## 2019-07-25 NOTE — Progress Notes (Signed)
Alisha Copeland is a 33 y.o. G2P0010 at [redacted]w[redacted]d by ultrasound admitted for induction of labor due to Hypertension.  Subjective: No c/o.  Resting comfortably with CLEA  Objective: BP 123/68   Pulse (!) 101   Temp 98 F (36.7 C) (Oral)   Resp 16   Ht 5\' 7"  (1.702 m)   Wt 90.8 kg   LMP 10/30/2018   SpO2 99%   BMI 31.36 kg/m  No intake/output data recorded. Total I/O In: -  Out: 400 [Urine:400]  FHT:  FHR: 125 bpm, variability: moderate,  accelerations:  Present,  decelerations:  Absent UC:   regular, every 2 minutes SVE:   Dilation: 5.5 Effacement (%): 70 Station: -3 Exam by:: Dr. Lynnette Caffey  AROM performed  Labs: Lab Results  Component Value Date   WBC 14.5 (H) 07/24/2019   HGB 11.9 (L) 07/24/2019   HCT 34.5 (L) 07/24/2019   MCV 88.2 07/24/2019   PLT 234 07/24/2019    Assessment / Plan: Induction of labor due to [facility],  progressing well on pitocin  Labor: Progressing normally Preeclampsia:  n/a Fetal Wellbeing:  Category I Pain Control:  Epidural I/D:  n/a Anticipated MOD:  NSVD  Ramy Greth 07/25/2019, 2:49 AM

## 2019-07-25 NOTE — Anesthesia Postprocedure Evaluation (Signed)
Anesthesia Post Note  Patient: Alisha Copeland  Procedure(s) Performed: CESAREAN SECTION (N/A )     Patient location during evaluation: Mother Baby Anesthesia Type: Epidural Level of consciousness: oriented and awake and alert Pain management: pain level controlled Vital Signs Assessment: post-procedure vital signs reviewed and stable Respiratory status: spontaneous breathing and respiratory function stable Cardiovascular status: blood pressure returned to baseline and stable Postop Assessment: no headache, no backache, no apparent nausea or vomiting and able to ambulate Anesthetic complications: no    Last Vitals:  Vitals:   07/25/19 1930 07/25/19 1945  BP: 123/77   Pulse: (!) 109 (!) 106  Resp: 20 (!) 23  Temp: 37.9 C   SpO2: 96% 96%    Last Pain:  Vitals:   07/25/19 1930  TempSrc: Oral  PainSc: 0-No pain   Pain Goal:                Epidural/Spinal Function Cutaneous sensation: Able to Wiggle Toes (07/25/19 1930), Patient able to flex knees: No (07/25/19 1930), Patient able to lift hips off bed: No (07/25/19 1930), Back pain beyond tenderness at insertion site: No (07/25/19 1930), Progressively worsening motor and/or sensory loss: No (07/25/19 1930), Bowel and/or bladder incontinence post epidural: No (07/25/19 1930)  Barnet Glasgow

## 2019-07-25 NOTE — Progress Notes (Signed)
Pt still comf w/cle  SVE 7/c/0, OP Manual rotation attempted IUPC placed, toco inadequate  FHT cat 1  Continue pitocin titration to achieve adequate contractions. Progressing slowly but overall appropriately given inadequate ctxn FHT reassuring No s/s SIPE. Labetalol 200mg  BID.  Labs overall reassuring  Potts disease stable Hx hypoparathyroidism - Ca stable - 8.2 Hx anemia - s/p IV Fe. Hgb 11.9.  Expecting a boy - the third.  EFW 8#4 (89%ile).  GBS negative

## 2019-07-25 NOTE — Progress Notes (Signed)
Spiked temp to 103 Pt feeling well, pushing Presumed chorio Start amp/gent Pushing with excellent effort Continue to push

## 2019-07-25 NOTE — Progress Notes (Signed)
Patient pushing >1 hour with no descent of the vertex despite excellent effort.  FHT with sinusoidal-like pattern at times. Fetal caput barely +2 with pushing. Vertex not low enough to safely over OVD. Recommend primary CS for arrest of descent in setting of nrFHT and chorioamnionitis. Will add clindamycin to abx.Risks discussed including infection, bleeding, damage to surrounding structures, the need for additional procedures including hysterectomy, and the possibility of uterine rupture with neonatal morbidity/mortality, scarring, and abnormal placentation with subsequent pregnancies. Patient agrees to proceed.  Amp/gent/clinda for chorioamnionitis and surgical prophylaxis.    Arty Baumgartner MD

## 2019-07-25 NOTE — Op Note (Addendum)
PROCEDURE DATE: 07/25/19  PREOPERATIVE DIAGNOSIS: Intrauterine pregnancy at 38.2 wga, Indication: arrest of descent, nrFHT  POSTOPERATIVE DIAGNOSIS:The same  PROCEDURE: primary Low TransverseCesarean Section  SURGEON: Dr. Lucillie Garfinkel  INDICATIONS:This is a 33 yo G2P0 at 36.2 wga requiring cesarean section secondary to non-reassuring FHT and arrest of descent.  Patient induced for cHTN and progressed through labor. She reached complete dilation and was pushing >1 hour with no descent of the vertex despite excellent effort. She spiked a temp. FHT with sinusoidal-like pattern at times. Fetal caput barely +2 with pushing. Vertex not low enough to safely over OVD. Recommend urgent primary CS for arrest of descent, nrFHT and chorioamnionitis. Abx started. Decision made to proceed with pLTCS.The risks of cesarean section discussed with the patient included but were not limited to: bleeding which may require transfusion or reoperation; infection which may require antibiotics; injury to bowel, bladder, ureters or other surrounding organs; injury to the fetus; need for additional procedures including hysterectomy in the event of a life-threatening hemorrhage; placental abnormalities wth subsequent pregnancies, incisional problems, thromboembolic phenomenon and other postoperative/anesthesia complications. The patient agreed with the proposed plan, giving informed consent for the procedure.   FINDINGS: Viable maleinfant in vertex presentation,APGARspending, Weight pending, Amniotic fluid clear, Intact placenta, three vessel cord. Grossly normal uterus. .  ANESTHESIA: Epidural ESTIMATED BLOOD LOSS: 623cc SPECIMENS: Placenta for pathology COMPLICATIONS: Extension of tearing into LUS, surgical site bleeding  PROCEDURE IN DETAIL: The patient received intravenous antibiotics and had sequential compression devices applied to her lower extremities while in the preoperative area.  [place] taken to the operating roomwhere epidural anesthesiawas dosed up to surgical level andwas found to be adequate. She was then placed in a dorsal supine position with a leftward tilt,and prepped and draped in a sterile manner.A foley catheter was placed into her bladder and attached to constant gravity. After an adequate timeout was performed, aPfannenstiel skin incision was made with scalpel and carried through to the underlying layer of fascia. Sandria Manly entry was used to stretch fasica and under the peritoneal cavity.A bladder flap was created sharply and developed bluntly.Atransverse hysterotomy was made with a scalpel and extended bilaterally bluntly. The bladder blade was then removed. The infant was successfully delivered with the assistance of kiwi vacuum without any pop offs, and cord was clamped and cut and infant was handed over to awaiting neonatology team. Uterine massage was then administered and the placenta delivered intact with three-vessel cord. Cord gases were taken. The uterus was cleared of clot and debris. Two extensions into the LUS were noted and sutured in a running fashion with 0' vicryl.The hysterotomy was closed with 0 vicryl.A second imbricating suture of 0-vicryl was used to reinforce the incision and aid in hemostasis. O'Leary stitch placed on the left. Arista was placed and hemostasis achieved. The fascia was closed with 0-Vicryl in a running fashion with good restoration of anatomy. The subcutaneus tissue was irrigated and was reapproximated using three interrupted plain gut stitches. The skin was closed with 4-0 Vicryl in a subcuticular fashion.  All surgical site and was hemostatic at end of procedure without any further bleeding on exam.    It's a boy - Leslee Home III!!    Pt tolerated the procedure well. All sponge/lap/needle counts were correct X 2. Pt taken to recovery room in stable condition.  Plan for   Lucillie Garfinkel MD

## 2019-07-26 ENCOUNTER — Other Ambulatory Visit: Payer: Self-pay

## 2019-07-26 LAB — CBC
HCT: 26.9 % — ABNORMAL LOW (ref 36.0–46.0)
Hemoglobin: 9.1 g/dL — ABNORMAL LOW (ref 12.0–15.0)
MCH: 30.3 pg (ref 26.0–34.0)
MCHC: 33.8 g/dL (ref 30.0–36.0)
MCV: 89.7 fL (ref 80.0–100.0)
Platelets: 189 10*3/uL (ref 150–400)
RBC: 3 MIL/uL — ABNORMAL LOW (ref 3.87–5.11)
RDW: 17.7 % — ABNORMAL HIGH (ref 11.5–15.5)
WBC: 27.3 10*3/uL — ABNORMAL HIGH (ref 4.0–10.5)
nRBC: 0 % (ref 0.0–0.2)

## 2019-07-26 LAB — TYPE AND SCREEN
ABO/RH(D): A NEG
Antibody Screen: POSITIVE
Unit division: 0
Unit division: 0

## 2019-07-26 LAB — BPAM RBC
Blood Product Expiration Date: 202010272359
Blood Product Expiration Date: 202011062359
Unit Type and Rh: 600
Unit Type and Rh: 600

## 2019-07-26 MED ORDER — LABETALOL HCL 100 MG PO TABS
100.0000 mg | ORAL_TABLET | Freq: Two times a day (BID) | ORAL | Status: DC
  Administered 2019-07-26: 100 mg via ORAL
  Filled 2019-07-26: qty 1

## 2019-07-26 NOTE — Progress Notes (Signed)
Subjective: Postpartum Day 1: Cesarean Delivery s/s nrFHT, arrest of descent, and chorio Patient reports tolerating PO.  Doing well. Denies s/s SIPE. Denies f/c. Minimal pain.   Objective: Vital signs in last 24 hours: Temp:  [97.7 F (36.5 C)-103 F (39.4 C)] 98.1 F (36.7 C) (10/10 0430) Pulse Rate:  [89-116] 97 (10/10 0430) Resp:  [17-24] 18 (10/10 0430) BP: (104-157)/(58-104) 109/68 (10/10 0430) SpO2:  [91 %-99 %] 97 % (10/10 0430)  Physical Exam:  General: alert, cooperative and appears stated age 33: appropriate Uterine Fundus: firm Incision: healing well, no significant drainage, no dehiscence, no significant erythema DVT Evaluation: No evidence of DVT seen on physical exam. Negative Homan's sign. No cords or calf tenderness. No significant calf/ankle edema.  Recent Labs    07/24/19 1952 07/26/19 0541  HGB 11.9* 9.1*  HCT 34.5* 26.9*    Assessment/Plan: Status post Cesarean section. Doing well postoperatively.  Afebrile. Well appearing.  BP good control - continue Labetalol 200mg  BID Continue abx x 24 hours pp given chorio and persistent elevated WBC count. Repeat CBC in AM. No s/s anemia. Continue PNV with iron. D/W patient female infant circumcision, risks/benefits reviewed. All questions answered.   Tyson Dense 07/26/2019, 8:19 AM

## 2019-07-26 NOTE — Lactation Note (Signed)
This note was copied from a baby's chart. Lactation Consultation Note  Patient Name: Alisha Copeland [id number] Date: 07/26/2019 Reason for consult: Initial assessment;1st time breastfeeding;Early term 37-38.6wks P1, 9 hour female infant. Mom has breastfeed 3 times since delivery. LC did not see a latch at this time, per mom, infant breastfeed less than 2 hours ago for 20 minutes  and mom was doing STS with infant when [facility] entered the room.  Per mom, she has Spectra 2 DEBP at home.  Mom knows to breastfeed infant according hunger cues, 8 to 12 times within 24 hours and on demand. Mom knows to call Nurse or [place] if she has any questions, concerns or need assistance with latching infant to breast. Mom knows how to hand express,  per mom, Nurse taught her how to do it. Reviewed Baby & Me book's Breastfeeding Basics.  Mom made aware of O/P services, breastfeeding support groups, community resources, and our phone # for post-discharge questions.     Maternal Data Formula Feeding for Exclusion: No Has patient been taught Hand Expression?: Yes Does the patient have breastfeeding experience prior to this delivery?: No  Feeding Feeding Type: Breast Fed  LATCH Score Latch: Repeated attempts needed to sustain latch, nipple held in mouth throughout feeding, stimulation needed to elicit sucking reflex.  Audible Swallowing: None  Type of Nipple: Everted at rest and after stimulation  Comfort (Breast/Nipple): Soft / non-tender  Hold (Positioning): Full assist, staff holds infant at breast  LATCH Score: 5  Interventions Interventions: Breast feeding basics reviewed;DEBP;Hand pump;Skin to skin  Lactation Tools Discussed/Used WIC Program: No   Consult Status Consult Status: Follow-up Date: 07/26/19 Follow-up type: In-patient    [name] 07/26/2019, 3:50 AM

## 2019-07-27 ENCOUNTER — Encounter (HOSPITAL_COMMUNITY): Payer: Self-pay | Admitting: Obstetrics and Gynecology

## 2019-07-27 LAB — CBC
HCT: 26.2 % — ABNORMAL LOW (ref 36.0–46.0)
Hemoglobin: 8.7 g/dL — ABNORMAL LOW (ref 12.0–15.0)
MCH: 30.2 pg (ref 26.0–34.0)
MCHC: 33.2 g/dL (ref 30.0–36.0)
MCV: 91 fL (ref 80.0–100.0)
Platelets: 216 10*3/uL (ref 150–400)
RBC: 2.88 MIL/uL — ABNORMAL LOW (ref 3.87–5.11)
RDW: 18.2 % — ABNORMAL HIGH (ref 11.5–15.5)
WBC: 16 10*3/uL — ABNORMAL HIGH (ref 4.0–10.5)
nRBC: 0 % (ref 0.0–0.2)

## 2019-07-27 MED ORDER — LABETALOL HCL 100 MG PO TABS
100.0000 mg | ORAL_TABLET | Freq: Every day | ORAL | Status: DC
  Administered 2019-07-27: 100 mg via ORAL
  Filled 2019-07-27: qty 1

## 2019-07-27 MED ORDER — LABETALOL HCL 100 MG PO TABS: 100.0000 mg | ORAL_TABLET | Freq: Once | ORAL | Status: DC

## 2019-07-27 MED ORDER — CALCIUM CARBONATE ANTACID 500 MG PO CHEW
1.0000 | CHEWABLE_TABLET | ORAL | Status: DC | PRN
  Administered 2019-07-27: 200 mg via ORAL
  Filled 2019-07-27: qty 1

## 2019-07-27 NOTE — Lactation Note (Signed)
This note was copied from a baby's chart. Lactation Consultation Note  Patient Name: Alisha Copeland [id number] Date: 07/27/2019 P1, 51 hour, ETI female infant, weight loss-3%, on  double phototherapy due high bilirubin level 14.4 at 48 hours of life. LC entered room mom had been breastfeeding for 10 minutes, mom was sitting in a chair and infant was latched on right breast in the  cross cradle position, infant had a deep latch and swallows observed.  Infant breastfed for 20 minutes, then  mom hand expressed 3 ml of EBM that was mixed with 22 ml of Similac Advance with iron (20 kcal) and infant was supplemented using a foley cup. Supplementation  that was given to infant was based on infant's age/ hours of life with breastfeeding.  Parents were compliant with bili bed lights remaining on infant while  Mom was breastfeeding As [facility] left the room mom started using the  DEBP. Mom's plan: 1. Mom will latch infant to breast according to hunger cues, 8 to 12 times within 24 hours and on demand. 2. Will keep billi lights on while feeding infant. 3. Mom will mix pumped EBM and mix with formula and give feeding amounts based on infant's age/ hours of life and offer by foley cup. 4. Mom will pump after breastfeeding infant to supplement EBM  for future feeding.    Maternal Data Has patient been taught Hand Expression?: Yes  Feeding Feeding Type: Breast Fed  LATCH Score Latch: Grasps breast easily, tongue down, lips flanged, rhythmical sucking.  Audible Swallowing: Spontaneous and intermittent  Type of Nipple: Everted at rest and after stimulation  Comfort (Breast/Nipple): Filling, red/small blisters or bruises, mild/mod discomfort(healing left breast only)  Hold (Positioning): No assistance needed to correctly position infant at breast.  LATCH Score: 9  Interventions Interventions: Hand express;Coconut oil;DEBP  Lactation Tools Discussed/Used     Consult Status Consult Status:  Follow-up Date: 07/28/19 Follow-up type: In-patient    [name] 07/27/2019, 9:31 PM

## 2019-07-27 NOTE — Progress Notes (Signed)
Pt scheduled to receive 200mg  labetalol at 2200. BPs have been running 90s-100s/60s-70s. Last BP at 2221 108/76, with HR of 103. Dr. Royston Sinner called and order received to decrease dose to 100mg  bid. Parameter requested and received to hold if BP <90/50. Pt informed and new dose given.

## 2019-07-27 NOTE — Progress Notes (Signed)
Subjective: Postpartum Day 2: Cesarean Delivery Patient reports tolerating PO, + flatus and no problems voiding.    Objective: Vital signs in last 24 hours: Temp:  [97.3 F (36.3 C)-97.9 F (36.6 C)] 97.9 F (36.6 C) (10/11 0533) Pulse Rate:  [82-103] 82 (10/11 0533) Resp:  [16-18] 18 (10/11 0533) BP: (97-108)/(63-76) 103/71 (10/11 0533) SpO2:  [99 %] 99 % (10/11 0533)  Physical Exam:  General: alert, cooperative and appears stated age 33: appropriate Uterine Fundus: firm Incision: healing well, no significant drainage, no dehiscence, no significant erythema DVT Evaluation: No evidence of DVT seen on physical exam. Negative Homan's sign. No cords or calf tenderness. No significant calf/ankle edema.  Recent Labs    07/26/19 0541 07/27/19 0518  HGB 9.1* 8.7*  HCT 26.9* 26.2*    Assessment/Plan: Status post Cesarean section. Doing well postoperatively.  Decrease labetalol to 100mg  once every night (prepregnancy dose). Circ still unable to be performed s/s feeding issues.   Tyson Dense 07/27/2019, 8:59 AM

## 2019-07-27 NOTE — Lactation Note (Addendum)
This note was copied from a baby's chart. Lactation Consultation Note  Patient Name: Alisha Copeland [id number] Date: 07/27/2019 Reason for consult: Early term 37-38.6wks;Primapara;1st time breastfeeding  P1 mother whose infant is now 53 hours old.  This is an ETI at 38+2 weeks.  Mother was breast feeding baby in the cradle hold when I arrived.  He had a wide gape and flanged lips.  He appeared to be slipping down in mother's arms and I requested permission to assist her.  Mother accepted help.  Showed her the cross cradle position instead and he latched easily.  Demonstrated intermittent breast compressions and gentle stimulation.  Due to his jaundice level and first stool at 38 hours of life I suggested mother feed him at least every three hours and earlier if he shows feeding cues.  Observed the jaundiced skin with parents.  Baby was feeding for approximately 20 minutes prior to my arrival and fed for an additional while I was in the room.  When he self released mother's nipple was rounded and intact.  Swallows heard during feeding and mother pleased.  Offered to initiate the DEBP for breast stimulation and to obtain colostrum for further supplementation.  Mother willing to pump.  Discussed pump, pump parts, assembly, disassembly and cleaning.  Mother did a return demonstration of pump parts assembly.  #24 flange size is appropriate at this time.  Suggested mother pump immediately after every feeding and give back any EBM she obtains to baby.  Finger feeding and spoon feeding demonstrated.  Informed mother that when she is able to obtain larger volumes of colostrum her RN can show her alternative methods for supplementation.  Parents very receptive to learning and will follow this feeding plan throughout the evening and night.  Mother will call for latch assistance as needed.  Father present and supportive.  Mother has a DEBP for home use.   Maternal Data Formula Feeding for Exclusion: No Has  patient been taught Hand Expression?: Yes Does the patient have breastfeeding experience prior to this delivery?: No  Feeding Feeding Type: Breast Fed  LATCH Score Latch: Grasps breast easily, tongue down, lips flanged, rhythmical sucking.  Audible Swallowing: A few with stimulation  Type of Nipple: Everted at rest and after stimulation  Comfort (Breast/Nipple): Soft / non-tender  Hold (Positioning): Assistance needed to correctly position infant at breast and maintain latch.  LATCH Score: 8  Interventions Interventions: Breast feeding basics reviewed;Assisted with latch;Skin to skin;Breast massage;Hand express;Breast compression;Adjust position;DEBP;Position options;Support pillows  Lactation Tools Discussed/Used Tools: Pump Breast pump type: Double-Electric Breast Pump Pump Review: Setup, frequency, and cleaning;Milk Storage Initiated by:: Paul Dykes Date initiated:: 07/27/19   Consult Status Consult Status: Follow-up Date: 07/28/19 Follow-up type: In-patient    Velencia Lenart R Shakila Mak 07/27/2019, 5:30 PM

## 2019-07-28 ENCOUNTER — Encounter (HOSPITAL_COMMUNITY): Payer: Self-pay | Admitting: *Deleted

## 2019-07-28 LAB — SURGICAL PATHOLOGY

## 2019-07-28 MED ORDER — FERROUS SULFATE 325 (65 FE) MG PO TBEC: 325.0000 mg | DELAYED_RELEASE_TABLET | Freq: Every day | ORAL | 3 refills | Status: DC

## 2019-07-28 MED ORDER — IBUPROFEN 800 MG PO TABS: 800.0000 mg | ORAL_TABLET | Freq: Three times a day (TID) | ORAL | 0 refills | Status: DC | PRN

## 2019-07-28 MED ORDER — ACETAMINOPHEN 500 MG PO TABS: 1000.0000 mg | ORAL_TABLET | Freq: Four times a day (QID) | ORAL | 0 refills | Status: DC | PRN

## 2019-07-28 MED ORDER — LABETALOL HCL 100 MG PO TABS: 200.0000 mg | ORAL_TABLET | Freq: Two times a day (BID) | ORAL | 1 refills | Status: DC

## 2019-07-28 MED ORDER — OXYCODONE HCL 5 MG PO TABS: 5.0000 mg | ORAL_TABLET | Freq: Four times a day (QID) | ORAL | 0 refills | Status: DC | PRN

## 2019-07-28 NOTE — Lactation Note (Signed)
This note was copied from a baby's chart. Lactation Consultation Note  Patient Name: Alisha Reniyah Burgo [id number] Date: 07/28/2019 Reason for consult: Follow-up assessment  P1 mother whose infant is now 55 hours of life.  This is an ETI at 38+2 weeks.  \  Baby has been under double phototherapy since 48 hours of life.  While I was visiting with family today the NP entered and discontinued the phototherapy.   Mother has been following the feeding plan established yesterday and breast feeds at least every three hours and sooner if he shows feeding cues.  She has been supplementing with her EBM and also with formula to increase volumes per guidelines.  Baby has been feeding using the foley cup.    He was sleepy and I offered to assist with providing more supplementation.  Mother agreed.  Parents observed him cup feeding more volume and I demonstrated effective burping to allow him to consume even more after burping.  Parents realized they may need to pat him more firmly while burping but informed me that he usually burps well.  They will take the foley cup home and continue with this plan until they visit the pediatrician tomorrow.  Engorgement prevention/treatment reviewed.  Mother has a manual pump and a DEBP for home use.  She has our OP phone number for any questions/concerns after discharge.  Baby will have a circumcision today and be discharged later this afternoon.  Parents very excited to hear this good news.   Maternal Data    Feeding Feeding Type: Breast Fed  LATCH Score Latch: Grasps breast easily, tongue down, lips flanged, rhythmical sucking.  Audible Swallowing: A few with stimulation  Type of Nipple: Everted at rest and after stimulation  Comfort (Breast/Nipple): Filling, red/small blisters or bruises, mild/mod discomfort  Hold (Positioning): No assistance needed to correctly position infant at breast.  LATCH Score: 8  Interventions    Lactation Tools  Discussed/Used     Consult Status Consult Status: Complete Date: 07/28/19 Follow-up type: Call as needed    Rolande Moe R Leslee Haueter 07/28/2019, 10:15 AM

## 2019-07-28 NOTE — Discharge Instructions (Signed)
Call office for appointment and BP check in 1-2 weeks

## 2019-07-28 NOTE — Discharge Summary (Signed)
Obstetric Discharge Summary Reason for Admission: induction of labor Prenatal Procedures: none Intrapartum Procedures: cesarean: low cervical, transverse Postpartum Procedures: none Complications-Operative and Postpartum: none Hemoglobin  Date Value Ref Range Status  07/27/2019 8.7 (L) 12.0 - 15.0 g/dL Final   HCT  Date Value Ref Range Status  07/27/2019 26.2 (L) 36.0 - 46.0 % Final    Physical Exam:  General: alert, cooperative and no distress Lochia: appropriate Uterine Fundus: firm Incision: healing well DVT Evaluation: No evidence of DVT seen on physical exam.  Discharge Diagnoses: Term Pregnancy-delivered  Discharge Information: Date: 07/28/2019 Activity: pelvic rest Diet: routine Medications: PNV, Ibuprofen and Tylenol, labetalol 200mg  BID Condition: stable Instructions: refer to practice specific booklet Discharge to: home   Newborn Data: Live born female  Birth Weight: 7 lb 7.4 oz (3385 g) APGAR: 1, 6  Newborn Delivery   Birth date/time: 07/[birth year]/2019 18:04:00 Delivery type:       Home with mother.  Shon Millet II 07/28/2019, 8:12 AM

## 2019-09-03 ENCOUNTER — Other Ambulatory Visit: Payer: Self-pay

## 2019-09-03 ENCOUNTER — Other Ambulatory Visit: Payer: Self-pay | Admitting: Endocrinology

## 2019-09-03 DIAGNOSIS — E209 Hypoparathyroidism, unspecified: Secondary | ICD-10-CM

## 2019-09-03 MED ORDER — CALCITRIOL 0.5 MCG PO CAPS: 2.0000 ug | ORAL_CAPSULE | Freq: Every day | ORAL | 2 refills | Status: DC

## 2019-09-03 NOTE — Telephone Encounter (Signed)
Patient states that she did not take it while hospitalized for C-Section, but that to her knowledge she is supposed to continue taking and it was not to be d/c

## 2019-09-03 NOTE — Telephone Encounter (Signed)
Called pt at Dr. Cordelia Pen request to ask the following:  1.  Our records indicate your medication was d/c'd by Dr. Gaetano Net. Did Dr. Gaetano Net d/c Calcitrol? 2.  If so, will need to schedule appt for follow up before refilling. 3.  If not, will send message back to Dr. Loanne Drilling for him to address.  LVM requesting returned call.

## 2019-09-03 NOTE — Telephone Encounter (Signed)
calcitRIOL (ROCALTROL) 0.5 MCG capsule 360 capsule 2 09/03/2019    Sig - Route: Take 4 capsules (2 mcg total) by mouth daily. - Oral   Sent to pharmacy as: calcitRIOL (ROCALTROL) 0.5 MCG capsule   E-Prescribing Status: Receipt confirmed by pharmacy (09/03/2019 4:35 PM EST)

## 2019-09-03 NOTE — Telephone Encounter (Signed)
Please advise 

## 2019-09-03 NOTE — Telephone Encounter (Signed)
Yes, please continue it.  I hope you and the baby are fine.  I'll see you next time.

## 2019-09-03 NOTE — Telephone Encounter (Signed)
Dr. Gaetano Net discontinued this medication. Please advise

## 2019-09-03 NOTE — Telephone Encounter (Signed)
please contact patient: Did Dr Gaetano Net d/c this?  If so, please advised f/u next available.

## 2019-09-05 MED ORDER — CALCITRIOL 0.5 MCG PO CAPS: 2.0000 ug | ORAL_CAPSULE | Freq: Every day | ORAL | 2 refills | Status: DC

## 2019-09-05 NOTE — Telephone Encounter (Signed)
RX sent to [facility].

## 2019-09-05 NOTE — Addendum Note (Signed)
Addended by: Cardell Peach I on: 09/05/2019 03:03 PM   Modules accepted: Orders

## 2019-09-05 NOTE — Telephone Encounter (Signed)
Sent to wrong pharmacy. Correct pharmacy is [facility] # [address], [place]

## 2019-09-26 ENCOUNTER — Other Ambulatory Visit: Payer: Self-pay

## 2019-09-30 ENCOUNTER — Ambulatory Visit (INDEPENDENT_AMBULATORY_CARE_PROVIDER_SITE_OTHER): Payer: Managed Care, Other (non HMO) | Admitting: Endocrinology

## 2019-09-30 ENCOUNTER — Encounter: Payer: Self-pay | Admitting: Endocrinology

## 2019-09-30 VITALS — BP 100/60 | HR 88 | Ht 67.0 in | Wt 162.4 lb

## 2019-09-30 DIAGNOSIS — E209 Hypoparathyroidism, unspecified: Secondary | ICD-10-CM | POA: Diagnosis not present

## 2019-09-30 DIAGNOSIS — I10 Essential (primary) hypertension: Secondary | ICD-10-CM

## 2019-09-30 MED ORDER — LABETALOL HCL 100 MG PO TABS: 100.0000 mg | ORAL_TABLET | Freq: Two times a day (BID) | ORAL | 1 refills | Status: DC

## 2019-09-30 NOTE — Patient Instructions (Signed)
Please reduce the labetalol to 1 pill, twice a day.  Blood tests are requested for you today.  We'll let you know about the results.  Please come back for a follow-up appointment in 6 months.

## 2019-09-30 NOTE — Progress Notes (Signed)
Subjective:    Patient ID: Alisha Copeland, female    DOB: 1986-01-06, 33 y.o.   MRN: [id number]  HPI Pt returns for f/u of primary hypoparathyroidism (dx'ed at age 32; she has been on rocaltrol since then; at age 59, pt says she was hospitalized in ICU for ARF, due to overtreatment/hypercalcemia; she has no h/o adrenal dz, infertility, vitiligo, urolithiasis, ceilac disease, yeast infections, pernicious anemia, or liver disease; she is considering a pregnancy at some point, but hesitates, due this condition; pt says she had genetic testing many years ago, but no hereditary syndrome was found; she declines Natpara; she takes a multivitamin, but not a dedicated vit-d supplement--just PNV). She is now 2 mos postpartum.  pt states she feels well in general.  She has slight cramps in the feet.  She has lost most of her pregnancy weight.  She is breast-feeing now.   Past Medical History:  Diagnosis Date  . Anemia   . Asthma   . Chronic kidney disease    UTI's  . GERD (gastroesophageal reflux disease)   . Hypertension   . Hypoparathyroidism ([place])   . POTS (postural orthostatic tachycardia syndrome)     Past Surgical History:  Procedure Laterality Date  . CESAREAN SECTION N/A 07/25/2019   Procedure: CESAREAN SECTION;  Surgeon: Tyson Dense, MD;  Location: [facility] LD ORS;  Service: Obstetrics;  Laterality: N/A;    Social History   Socioeconomic History  . Marital status: Married    Spouse name: Not on file  . Number of children: Not on file  . Years of education: Not on file  . Highest education level: Not on file  Occupational History  . Not on file  Tobacco Use  . Smoking status: Never Smoker  . Smokeless tobacco: Never Used  Substance and Sexual Activity  . Alcohol use: Yes    Comment: occassionaly  . Drug use: No  . Sexual activity: Yes    Birth control/protection: Pill  Other Topics Concern  . Not on file  Social History Narrative  . Not on file   Social  Determinants of Health   Financial Resource Strain:   . Difficulty of Paying Living Expenses: Not on file  Food Insecurity:   . Worried About Charity fundraiser in the Last Year: Not on file  . Ran Out of Food in the Last Year: Not on file  Transportation Needs:   . Lack of Transportation (Medical): Not on file  . Lack of Transportation (Non-Medical): Not on file  Physical Activity:   . Days of Exercise per Week: Not on file  . Minutes of Exercise per Session: Not on file  Stress:   . Feeling of Stress : Not on file  Social Connections:   . Frequency of Communication with Friends and Family: Not on file  . Frequency of Social Gatherings with Friends and Family: Not on file  . Attends Religious Services: Not on file  . Active Member of Clubs or Organizations: Not on file  . Attends Archivist Meetings: Not on file  . Marital Status: Not on file  Intimate Partner Violence:   . Fear of Current or Ex-Partner: Not on file  . Emotionally Abused: Not on file  . Physically Abused: Not on file  . Sexually Abused: Not on file    Current Outpatient Medications on File Prior to Visit  Medication Sig Dispense Refill  . albuterol (PROVENTIL HFA;VENTOLIN HFA) 108 (90 BASE) MCG/ACT inhaler Inhale  2 puffs into the lungs every 6 (six) hours as needed for wheezing. 1 Inhaler 1  . calcitRIOL (ROCALTROL) 0.5 MCG capsule Take 4 capsules (2 mcg total) by mouth daily. 360 capsule 2  . ferrous sulfate 325 (65 FE) MG EC tablet Take 1 tablet (325 mg total) by mouth daily with breakfast. 30 tablet 3  . Prenatal MV & Min w/FA-DHA (PRENATAL ADULT GUMMY/DHA/FA PO) Take 2 tablets by mouth every evening.     No current facility-administered medications on file prior to visit.    Allergies  Allergen Reactions  . Beta Adrenergic Blockers     Pt noted old and is on labetolol currently   . Cisapride Other (See Comments)    Prolonged QT syndrome  . Methylphenidate Hcl Other (See Comments)     Increased tachycardia  . Nitrofurantoin Nausea And Vomiting  . Propranolol Other (See Comments)    Lethargy and fatigue  . Sertraline Hcl Diarrhea  . Sulfamethoxazole-Trimethoprim Nausea And Vomiting  . Amphetamine Palpitations  . Ciprofloxacin Hives and Rash  . Dextroamphetamine Palpitations  . Mononessa [Norgestimate-Eth Estradiol] Rash  . Sulfa Antibiotics Rash    Other reaction(s): GI Intolerance  . Vancomycin Hives and Rash    Family History  Problem Relation Age of Onset  . Hypertension Father   . Irritable bowel syndrome Mother   . Hypertension Paternal Grandfather   . Lung cancer Maternal Grandfather   . Diabetes Maternal Grandmother   . Crohn's disease Maternal Grandmother   . Colon cancer Neg Hx   . Rectal cancer Neg Hx   . Stomach cancer Neg Hx   . Other Neg Hx        hypoparathyroidism    BP 100/60 (BP Location: Left Arm, Patient Position: Sitting, Cuff Size: Normal)   Pulse 88   Ht 5\' 7"  (1.702 m)   Wt 162 lb 6.4 oz (73.7 kg)   SpO2 99%   BMI 25.44 kg/m    Review of Systems Denies numbness    Objective:   Physical Exam VITAL SIGNS:  See vs page GENERAL: no distress NECK: thyroid is slightly and diffusely enlarged.       Assessment & Plan:  HTN: overcontrolled Hypoparathyroidism: recheck today  Patient Instructions  Please reduce the labetalol to 1 pill, twice a day.  Blood tests are requested for you today.  We'll let you know about the results.  Please come back for a follow-up appointment in 6 months.

## 2019-10-01 ENCOUNTER — Telehealth: Payer: Self-pay | Admitting: Internal Medicine

## 2019-10-01 ENCOUNTER — Other Ambulatory Visit: Payer: Self-pay | Admitting: Endocrinology

## 2019-10-01 DIAGNOSIS — E209 Hypoparathyroidism, unspecified: Secondary | ICD-10-CM

## 2019-10-01 LAB — BASIC METABOLIC PANEL
BUN: 21 mg/dL (ref 6–23)
CO2: 31 mEq/L (ref 19–32)
Calcium: 12.8 mg/dL — ABNORMAL HIGH (ref 8.4–10.5)
Chloride: 100 mEq/L (ref 96–112)
Creatinine, Ser: 1 mg/dL (ref 0.40–1.20)
GFR: 63.71 mL/min (ref >60.00)
Glucose, Bld: 99 mg/dL (ref 70–99)
Potassium: 3.8 mEq/L (ref 3.5–5.1)
Sodium: 138 mEq/L (ref 135–145)

## 2019-10-01 LAB — PTH, INTACT AND CALCIUM
Calcium: 13.2 mg/dL (ref 8.6–10.2)
PTH: 1 pg/mL — ABNORMAL LOW (ref 14–64)

## 2019-10-01 LAB — VITAMIN D 25 HYDROXY (VIT D DEFICIENCY, FRACTURES): VITD: 20.37 ng/mL — ABNORMAL LOW (ref 30.00–100.00)

## 2019-10-01 LAB — TSH: TSH: 1.25 u[IU]/mL (ref 0.35–4.50)

## 2019-10-01 NOTE — Telephone Encounter (Signed)
At Dr. Cordelia Pen request, called pt to review lab results and new orders. Also called to schedule lab appt. LVM requesting returned call. Will continue efforts to follow up.

## 2019-10-01 NOTE — Telephone Encounter (Signed)
Received a call this morning at 5:30 regarding a critical level for calcium.  Blood work done yesterday afternoon.  Calcium is 13.2. labs not in chart yet.  No action taken - will let Dr Loanne Drilling review this morning.

## 2019-10-01 NOTE — Telephone Encounter (Signed)
SECOND ATTEMPT:  Called pt to schedule lab appt for Friday and to inform about lab results and new orders. LVM again informing to call me back today.

## 2019-10-01 NOTE — Telephone Encounter (Signed)
please contact patient: Calcium is high.  D/c calcitriol Recheck labs fri am.  Please call ahead, so the lab can expect you.

## 2019-10-01 NOTE — Telephone Encounter (Signed)
Pt returned call. Informed about Dr. Cordelia Pen orders as well as lab results. Verbalized acceptance and understanding. Lab appt scheduled for 10/03/19 @ 2:30pm.

## 2019-10-03 ENCOUNTER — Other Ambulatory Visit: Payer: Self-pay

## 2019-10-03 ENCOUNTER — Other Ambulatory Visit: Payer: Self-pay | Admitting: Endocrinology

## 2019-10-03 ENCOUNTER — Other Ambulatory Visit (INDEPENDENT_AMBULATORY_CARE_PROVIDER_SITE_OTHER): Payer: Managed Care, Other (non HMO)

## 2019-10-03 DIAGNOSIS — E209 Hypoparathyroidism, unspecified: Secondary | ICD-10-CM

## 2019-10-03 LAB — BASIC METABOLIC PANEL
BUN: 21 mg/dL (ref 6–23)
CO2: 29 mEq/L (ref 19–32)
Calcium: 11.5 mg/dL — ABNORMAL HIGH (ref 8.4–10.5)
Chloride: 103 mEq/L (ref 96–112)
Creatinine, Ser: 0.83 mg/dL (ref 0.40–1.20)
GFR: 79 mL/min (ref >60.00)
Glucose, Bld: 103 mg/dL — ABNORMAL HIGH (ref 70–99)
Potassium: 3.8 mEq/L (ref 3.5–5.1)
Sodium: 140 mEq/L (ref 135–145)

## 2019-10-20 ENCOUNTER — Other Ambulatory Visit: Payer: Managed Care, Other (non HMO)

## 2019-10-21 ENCOUNTER — Other Ambulatory Visit (INDEPENDENT_AMBULATORY_CARE_PROVIDER_SITE_OTHER): Payer: Managed Care, Other (non HMO)

## 2019-10-21 ENCOUNTER — Other Ambulatory Visit: Payer: Self-pay

## 2019-10-21 DIAGNOSIS — E209 Hypoparathyroidism, unspecified: Secondary | ICD-10-CM

## 2019-10-22 ENCOUNTER — Other Ambulatory Visit: Payer: Self-pay | Admitting: Endocrinology

## 2019-10-22 DIAGNOSIS — E209 Hypoparathyroidism, unspecified: Secondary | ICD-10-CM

## 2019-10-22 LAB — BASIC METABOLIC PANEL
BUN: 14 mg/dL (ref 6–23)
CO2: 28 mEq/L (ref 19–32)
Calcium: 8.1 mg/dL — ABNORMAL LOW (ref 8.4–10.5)
Chloride: 102 mEq/L (ref 96–112)
Creatinine, Ser: 0.71 mg/dL (ref 0.40–1.20)
GFR: 94.57 mL/min (ref >60.00)
Glucose, Bld: 101 mg/dL — ABNORMAL HIGH (ref 70–99)
Potassium: 3.5 mEq/L (ref 3.5–5.1)
Sodium: 139 mEq/L (ref 135–145)

## 2020-03-11 LAB — COMPREHENSIVE METABOLIC PANEL
Albumin: 4.7 (ref 3.5–5.0)
Calcium: 8.5 — AB (ref 8.7–10.7)
GFR calc Af Amer: 134
GFR calc non Af Amer: 116
Globulin: 2.2

## 2020-03-11 LAB — IRON,TIBC AND FERRITIN PANEL
%SAT: 20
Ferritin: 99
Iron: 58
TIBC: 287
UIBC: 229

## 2020-03-11 LAB — BASIC METABOLIC PANEL
BUN: 19 (ref 4–21)
CO2: 28 — AB (ref 13–22)
Chloride: 103 (ref 99–108)
Creatinine: 0.7 (ref 0.5–1.1)
Glucose: 80
Potassium: 4.1 (ref 3.4–5.3)
Sodium: 140 (ref 137–147)

## 2020-03-11 LAB — HEPATIC FUNCTION PANEL
ALT: 14 (ref 7–35)
AST: 15 (ref 13–35)
Alkaline Phosphatase: 106 (ref 25–125)

## 2020-03-11 LAB — CBC AND DIFFERENTIAL
HCT: 39 (ref 36–46)
Hemoglobin: 13.1 (ref 12.0–16.0)
Neutrophils Absolute: 4.7
Platelets: 306 (ref 150–399)
WBC: 8

## 2020-03-11 LAB — CBC: RBC: 4.5 (ref 3.87–5.11)

## 2020-04-07 ENCOUNTER — Ambulatory Visit: Payer: Managed Care, Other (non HMO) | Admitting: Endocrinology

## 2020-04-29 ENCOUNTER — Ambulatory Visit: Payer: Managed Care, Other (non HMO) | Admitting: Endocrinology

## 2020-05-31 ENCOUNTER — Ambulatory Visit (INDEPENDENT_AMBULATORY_CARE_PROVIDER_SITE_OTHER): Payer: Managed Care, Other (non HMO) | Admitting: Endocrinology

## 2020-05-31 ENCOUNTER — Encounter: Payer: Self-pay | Admitting: Endocrinology

## 2020-05-31 ENCOUNTER — Other Ambulatory Visit: Payer: Self-pay

## 2020-05-31 VITALS — BP 118/88 | HR 81 | Ht 67.0 in | Wt 151.0 lb

## 2020-05-31 DIAGNOSIS — E559 Vitamin D deficiency, unspecified: Secondary | ICD-10-CM

## 2020-05-31 DIAGNOSIS — E209 Hypoparathyroidism, unspecified: Secondary | ICD-10-CM

## 2020-05-31 MED ORDER — CALCITRIOL 0.5 MCG PO CAPS: 1.0000 ug | ORAL_CAPSULE | Freq: Every day | ORAL | 11 refills | Status: DC

## 2020-05-31 NOTE — Patient Instructions (Addendum)
Blood tests are requested for you today.  We'll let you know about the results.  Please come back for a follow-up appointment in 6 months, or sooner if another pregnancy happens. Marland Kitchen

## 2020-05-31 NOTE — Progress Notes (Signed)
Subjective:    Patient ID: Alisha Copeland, female    DOB: [birth year], 34 y.o.   MRN: [id number]  HPI Pt returns for f/u of primary hypoparathyroidism (dx'ed at age 19; she has been on rocaltrol since then; at age 89, pt says she was hospitalized in ICU for ARF, due to overtreatment/hypercalcemia; she has no h/o adrenal dz, infertility, vitiligo, urolithiasis, ceilac disease, yeast infections, pernicious anemia, or liver disease; she is considering a pregnancy at some point, but hesitates, due this condition; pt says she had genetic testing many years ago, but no hereditary syndrome was found; she declined Natpara when it was available; she takes a multivitamin, but not a dedicated vit-D supplement--just PNV). She is now 10 mos postpartum.  pt states she feels well in general.  She still has slight cramps in the feet.  She has lost almost all of her pregnancy weight.  She is breast-feeing now.  She takes rocaltrol 2x0.5 mcg/d. Past Medical History:  Diagnosis Date  . Anemia   . Asthma   . Chronic kidney disease    UTI's  . GERD (gastroesophageal reflux disease)   . Hypertension   . Hypoparathyroidism ([place])   . POTS (postural orthostatic tachycardia syndrome)     Past Surgical History:  Procedure Laterality Date  . CESAREAN SECTION N/A 07/25/2019   Procedure: CESAREAN SECTION;  Surgeon: Tyson Dense, MD;  Location: [facility] LD ORS;  Service: Obstetrics;  Laterality: N/A;    Social History   Socioeconomic History  . Marital status: Married    Spouse name: Not on file  . Number of children: Not on file  . Years of education: Not on file  . Highest education level: Not on file  Occupational History  . Not on file  Tobacco Use  . Smoking status: Never Smoker  . Smokeless tobacco: Never Used  Vaping Use  . Vaping Use: Never used  Substance and Sexual Activity  . Alcohol use: Yes    Comment: occassionaly  . Drug use: No  . Sexual activity: Yes    Birth control/protection:  Pill  Other Topics Concern  . Not on file  Social History Narrative  . Not on file   Social Determinants of Health   Financial Resource Strain:   . Difficulty of Paying Living Expenses:   Food Insecurity:   . Worried About Charity fundraiser in the Last Year:   . Arboriculturist in the Last Year:   Transportation Needs:   . Film/video editor (Medical):   Marland Kitchen Lack of Transportation (Non-Medical):   Physical Activity:   . Days of Exercise per Week:   . Minutes of Exercise per Session:   Stress:   . Feeling of Stress :   Social Connections:   . Frequency of Communication with Friends and Family:   . Frequency of Social Gatherings with Friends and Family:   . Attends Religious Services:   . Active Member of Clubs or Organizations:   . Attends Archivist Meetings:   Marland Kitchen Marital Status:   Intimate Partner Violence:   . Fear of Current or Ex-Partner:   . Emotionally Abused:   Marland Kitchen Physically Abused:   . Sexually Abused:     Current Outpatient Medications on File Prior to Visit  Medication Sig Dispense Refill  . albuterol (PROVENTIL HFA;VENTOLIN HFA) 108 (90 BASE) MCG/ACT inhaler Inhale 2 puffs into the lungs every 6 (six) hours as needed for wheezing. 1 Inhaler 1  .  ferrous sulfate 325 (65 FE) MG EC tablet Take 1 tablet (325 mg total) by mouth daily with breakfast. 30 tablet 3  . Prenatal MV & Min w/FA-DHA (PRENATAL ADULT GUMMY/DHA/FA PO) Take 2 tablets by mouth every evening.     No current facility-administered medications on file prior to visit.    Allergies  Allergen Reactions  . Beta Adrenergic Blockers     Pt noted old and is on labetolol currently   . Cisapride Other (See Comments)    Prolonged QT syndrome  . Methylphenidate Hcl Other (See Comments)    Increased tachycardia  . Nitrofurantoin Nausea And Vomiting  . Propranolol Other (See Comments)    Lethargy and fatigue  . Sertraline Hcl Diarrhea  . Sulfamethoxazole-Trimethoprim Nausea And Vomiting   . Amphetamine Palpitations  . Ciprofloxacin Hives and Rash  . Dextroamphetamine Palpitations  . Mononessa [Norgestimate-Eth Estradiol] Rash  . Sulfa Antibiotics Rash    Other reaction(s): GI Intolerance  . Vancomycin Hives and Rash    Family History  Problem Relation Age of Onset  . Hypertension Father   . Irritable bowel syndrome Mother   . Hypertension Paternal Grandfather   . Lung cancer Maternal Grandfather   . Diabetes Maternal Grandmother   . Crohn's disease Maternal Grandmother   . Colon cancer Neg Hx   . Rectal cancer Neg Hx   . Stomach cancer Neg Hx   . Other Neg Hx        hypoparathyroidism    BP 118/88   Pulse 81   Ht 5\' 7"  (1.702 m)   Wt 151 lb (68.5 kg)   SpO2 98%   BMI 23.65 kg/m    Review of Systems     Objective:   Physical Exam VITAL SIGNS:  See vs page GENERAL: no distress   25-OH vit-D=25 Lab Results  Component Value Date   PTH 8 (L) 05/31/2020   CALCIUM 8.4 (L) 05/31/2020   CAION 0.89 (L) 12/16/2015   PHOS 4.0 01/10/2017       Assessment & Plan:  Hypoparathyroidism, persistent Vit-D def: Increasing Vit-D level is next step to increase Ca++.  She is advised to increase to 2000 units/d.   Patient Instructions  Blood tests are requested for you today.  We'll let you know about the results.  Please come back for a follow-up appointment in 6 months, or sooner if another pregnancy happens. Marland Kitchen

## 2020-06-01 LAB — PTH, INTACT AND CALCIUM
Calcium: 8.4 mg/dL — ABNORMAL LOW (ref 8.6–10.2)
PTH: 8 pg/mL — ABNORMAL LOW (ref 14–64)

## 2020-06-01 LAB — VITAMIN D 25 HYDROXY (VIT D DEFICIENCY, FRACTURES): VITD: 24.62 ng/mL — ABNORMAL LOW (ref 30.00–100.00)

## 2020-08-06 ENCOUNTER — Ambulatory Visit: Payer: Managed Care, Other (non HMO) | Admitting: Family Medicine

## 2020-08-19 ENCOUNTER — Ambulatory Visit: Payer: Managed Care, Other (non HMO) | Admitting: Family Medicine

## 2020-08-24 ENCOUNTER — Encounter: Payer: Self-pay | Admitting: Family Medicine

## 2020-09-01 LAB — IRON,TIBC AND FERRITIN PANEL
%SAT: 15
Iron: 43
TIBC: 296
UIBC: 253

## 2020-09-01 LAB — MICROALBUMIN, URINE: Microalb, Ur: 7.5

## 2020-09-01 LAB — CBC AND DIFFERENTIAL
HCT: 40 (ref 36–46)
Hemoglobin: 13.2 (ref 12.0–16.0)
Neutrophils Absolute: 3.5
Platelets: 286 (ref 150–399)
WBC: 7.2

## 2020-09-01 LAB — BASIC METABOLIC PANEL
BUN: 16 (ref 4–21)
CO2: 24 — AB (ref 13–22)
Chloride: 97 — AB (ref 99–108)
Creatinine: 0.6 (ref 0.5–1.1)
Glucose: 82
Potassium: 3.5 (ref 3.4–5.3)
Sodium: 139 (ref 137–147)

## 2020-09-01 LAB — COMPREHENSIVE METABOLIC PANEL
Albumin: 4.8 (ref 3.5–5.0)
Calcium: 8.1 — AB (ref 8.7–10.7)
GFR calc Af Amer: 138
GFR calc non Af Amer: 119

## 2020-09-01 LAB — CBC: RBC: 4.66 (ref 3.87–5.11)

## 2020-09-04 ENCOUNTER — Other Ambulatory Visit: Payer: Self-pay

## 2020-09-07 ENCOUNTER — Encounter: Payer: Self-pay | Admitting: Family Medicine

## 2020-09-07 ENCOUNTER — Other Ambulatory Visit: Payer: Self-pay

## 2020-09-07 ENCOUNTER — Ambulatory Visit (INDEPENDENT_AMBULATORY_CARE_PROVIDER_SITE_OTHER): Payer: Managed Care, Other (non HMO) | Admitting: Family Medicine

## 2020-09-07 VITALS — BP 137/92 | HR 80 | Temp 98.1°F | Ht 66.75 in | Wt 147.0 lb

## 2020-09-07 DIAGNOSIS — I10 Essential (primary) hypertension: Secondary | ICD-10-CM | POA: Diagnosis not present

## 2020-09-07 DIAGNOSIS — I498 Other specified cardiac arrhythmias: Secondary | ICD-10-CM

## 2020-09-07 DIAGNOSIS — Z23 Encounter for immunization: Secondary | ICD-10-CM

## 2020-09-07 DIAGNOSIS — J4520 Mild intermittent asthma, uncomplicated: Secondary | ICD-10-CM | POA: Diagnosis not present

## 2020-09-07 DIAGNOSIS — G90A Postural orthostatic tachycardia syndrome (POTS): Secondary | ICD-10-CM

## 2020-09-07 MED ORDER — ALBUTEROL SULFATE HFA 108 (90 BASE) MCG/ACT IN AERS
1.0000 | INHALATION_SPRAY | Freq: Four times a day (QID) | RESPIRATORY_TRACT | 1 refills | Status: DC | PRN
Start: 2020-09-07 — End: 2021-11-10

## 2020-09-07 NOTE — Progress Notes (Addendum)
Office Note 09/07/2020  CC:  Chief Complaint  Patient presents with  . Establish Care    chronic issues    HPI:  Alisha Copeland is a 34 y.o. White female who is here to establish care Patient's most recent primary MD: Dr. Wende Neighbors in [place] (but not in a couple years). Old records from nephrologist (Dr. Hollie Salk) were reviewed prior to or during today's visit.  Her POTS not as bad as it used to be when in teens: orthostatic dizziness and nausea.  Heart rate inc/racing with changing posture, she lives with her sx's just fine. Many meds tried but none helped, not on any currently.  HTN: has been seeing a nephrologist for a couple years, initially to r/o secondary HTN, then to essentially manage her bp and follow labs, etc. Renal f/u last week per pt (no records of this visit avail today), bp was good, stayed off labetalol.  On no bp med at all currently. No bp med since pregnancy, delivered 07/2019, was on labetalol and bp well controlled during pregnancy. Ca++ 8.1, PO4 was " a little high"---says her usual. Home bp lately <130/80.  Has f/u with endo, Dr. Loanne Drilling, q54mo. No longer seeing Dr. Caryl Comes.  No longer going to [facility].  Has mild asthma, no probs recently.  Says albut inhaler likely expired, asks for new albut rx.   Past Medical History:  Diagnosis Date  . Allergic rhinitis   . Anemia    needed transfusion in preg 2020  . Celiac artery stenosis (HCC)    + SMA stenosis->detected during w/u for poss secondary HTN (looking for RAS). ? congenital. No sign of vasculitis or RAS.  Marland Kitchen GERD (gastroesophageal reflux disease)   . Hypertension    multiple w/u's for secondary HTN unrevealing/NEG  . Idiopathic hypoparathyroidism ([place])    formerly followed by peds endo at [facility]. Dr. Loanne Drilling as adult.  . Migraine syndrome    infrequent.  Remote hx of effexor for proph, +imitrex abortive med.  HAs infrequent.  . Mild intermittent asthma   . POTS (postural orthostatic  tachycardia syndrome)     Past Surgical History:  Procedure Laterality Date  . CESAREAN SECTION N/A 07/25/2019   Procedure: CESAREAN SECTION;  Surgeon: Tyson Dense, MD;  Location: [facility] LD ORS;  Service: Obstetrics;  Laterality: N/A;  . TRANSTHORACIC ECHOCARDIOGRAM  07/24/2017   NORMAL  . US KIDNEY BILATERAL ([place] HX)  01/26/2017   NO MASS OR HYDRO. LEFT KIDNEY 11.5CM. NO MASS OR HYDRO    Family History  Problem Relation Age of Onset  . Hypertension Father   . Pancreatic cancer Father   . Depression Father   . Heart attack Father   . Irritable bowel syndrome Mother   . Hypertension Mother   . Asthma Mother   . Hypertension Paternal Grandfather   . Arthritis Paternal Grandfather   . Heart attack Paternal Grandfather   . Lung cancer Maternal Grandfather   . Hypertension Maternal Grandfather   . Diabetes Maternal Grandmother   . Crohn's disease Maternal Grandmother   . Arthritis Maternal Grandmother   . Depression Maternal Grandmother   . Hyperlipidemia Maternal Grandmother   . Hypertension Maternal Grandmother   . Kidney disease Maternal Grandmother   . Mental illness Maternal Grandmother   . Arthritis Paternal Grandmother   . Hearing loss Paternal Grandmother   . Hypertension Paternal Grandmother   . Crohn's disease Paternal Grandmother   . Miscarriages / Stillbirths Sister   . Asthma  Brother   . Colon cancer Neg Hx   . Rectal cancer Neg Hx   . Stomach cancer Neg Hx   . Other Neg Hx        hypoparathyroidism    Social History   Socioeconomic History  . Marital status: Married    Spouse name: Not on file  . Number of children: Not on file  . Years of education: Not on file  . Highest education level: Not on file  Occupational History  . Not on file  Tobacco Use  . Smoking status: Never Smoker  . Smokeless tobacco: Never Used  Vaping Use  . Vaping Use: Never used  Substance and Sexual Activity  . Alcohol use: Yes    Comment: occassionaly  . Drug  use: No  . Sexual activity: Yes  Other Topics Concern  . Not on file  Social History Narrative   Married, 1 son as of 08/2020.   [place] native.   Educ: BA ([employer], [employer], [employer], [employer]).   Occup: pre-K teacher   No tob.   Rare ETOH.   Social Determinants of Health   Financial Resource Strain:   . Difficulty of Paying Living Expenses: Not on file  Food Insecurity:   . Worried About Charity fundraiser in the Last Year: Not on file  . Ran Out of Food in the Last Year: Not on file  Transportation Needs:   . Lack of Transportation (Medical): Not on file  . Lack of Transportation (Non-Medical): Not on file  Physical Activity:   . Days of Exercise per Week: Not on file  . Minutes of Exercise per Session: Not on file  Stress:   . Feeling of Stress : Not on file  Social Connections:   . Frequency of Communication with Friends and Family: Not on file  . Frequency of Social Gatherings with Friends and Family: Not on file  . Attends Religious Services: Not on file  . Active Member of Clubs or Organizations: Not on file  . Attends Archivist Meetings: Not on file  . Marital Status: Not on file  Intimate Partner Violence:   . Fear of Current or Ex-Partner: Not on file  . Emotionally Abused: Not on file  . Physically Abused: Not on file  . Sexually Abused: Not on file    Outpatient Encounter Medications as of 09/07/2020  Medication Sig  . calcitRIOL (ROCALTROL) 0.5 MCG capsule Take 2 capsules (1 mcg total) by mouth daily.  . Prenatal MV & Min w/FA-DHA (PRENATAL ADULT GUMMY/DHA/FA PO) Take 2 tablets by mouth every evening.  Marland Kitchen albuterol (PROVENTIL HFA;VENTOLIN HFA) 108 (90 BASE) MCG/ACT inhaler Inhale 2 puffs into the lungs every 6 (six) hours as needed for wheezing. (Patient not taking: Reported on 09/07/2020)  . [DISCONTINUED] labetalol (NORMODYNE) 100 MG tablet labetalol 100 mg tablet  TAKE 2 TABLETS(200 MG) BY MOUTH TWICE DAILY  . [DISCONTINUED] norethindrone  (NORLYDA) 0.35 MG tablet Norlyda 0.35 mg tablet  TAKE 1 TABLET BY MOUTH EVERY DAY   No facility-administered encounter medications on file as of 09/07/2020.    Allergies  Allergen Reactions  . Beta Adrenergic Blockers     Pt noted old and is on labetolol currently   . Cisapride Other (See Comments)    Prolonged QT syndrome  . Methylphenidate Hcl Other (See Comments)    Increased tachycardia  . Nitrofurantoin Nausea And Vomiting  . Propranolol Other (See Comments)    Lethargy and fatigue  .  Sertraline Hcl Diarrhea  . Sulfamethoxazole-Trimethoprim Nausea And Vomiting  . Amphetamine Palpitations  . Ciprofloxacin Hives and Rash  . Dextroamphetamine Palpitations  . Mononessa [Norgestimate-Eth Estradiol] Rash  . Sulfa Antibiotics Rash    Other reaction(s): GI Intolerance  . Vancomycin Hives and Rash    ROS Review of Systems  Constitutional: Negative for fatigue and fever.  HENT: Negative for congestion and sore throat.   Eyes: Negative for visual disturbance.  Respiratory: Negative for cough.   Cardiovascular: Negative for chest pain.  Gastrointestinal: Negative for abdominal pain and nausea.  Genitourinary: Negative for dysuria.  Musculoskeletal: Negative for back pain and joint swelling.  Skin: Negative for rash.  Neurological: Negative for weakness and headaches.  Hematological: Negative for adenopathy.    PE; Blood pressure (!) 137/92, pulse 80, temperature 98.1 F (36.7 C), temperature source Oral, height 5' 6.75" (1.695 m), weight 147 lb (66.7 kg), SpO2 98 %, unknown if currently breastfeeding. Body mass index is 23.2 kg/m.  Gen: Alert, well appearing.  Patient is oriented to person, place, time, and situation. AFFECT: pleasant, lucid thought and speech. KWI:OXBD: no injection, icteris, swelling, or exudate.  EOMI, PERRLA. Mouth: lips without lesion/swelling.  Oral mucosa pink and moist. Oropharynx without erythema, exudate, or swelling.  CV: RRR, no m/r/g.    LUNGS: CTA bilat, nonlabored resps, good aeration in all lung fields. EXT: no clubbing or cyanosis.  no edema.   Pertinent labs:  Lab Results  Component Value Date   TSH 1.25 09/30/2019   Lab Results  Component Value Date   WBC 8.0 03/11/2020   HGB 13.1 03/11/2020   HCT 39 03/11/2020   MCV 91.0 07/27/2019   PLT 306 03/11/2020   Lab Results  Component Value Date   CREATININE 0.7 03/11/2020   BUN 19 03/11/2020   NA 140 03/11/2020   K 4.1 03/11/2020   CL 103 03/11/2020   CO2 28 (A) 03/11/2020   Lab Results  Component Value Date   ALT 14 03/11/2020   AST 15 03/11/2020   ALKPHOS 106 03/11/2020   BILITOT 0.3 07/22/2019    Lab Results  Component Value Date   HGBA1C 4.9 03/18/2019   ASSESSMENT AND PLAN:   1) POTS: stable, pt living with the symptoms, no meds. No further specialist f/u at this time.  2) HTN: bp fine off meds since her pregnancy last year. Cont periodic home bp monitoring.  Cont f/u with nephrologist, Dr. Hollie Salk. Will get her most recent labs pt says done last week there.  3) Idiopathic hypoparathyroidism: cont calcitriol 0.50mcg, 2 tabs daily, most recent labs stable--get records. Keep routine f/u with Dr. Loanne Drilling.  4) Asthma, mild intermittent.  Quiescent lately. Needs renewal of proair rx so I ordered this today.  5) Preventative health: Last pap was 08/2019. Last Tdap 2020. Flu vaccine today.  An After Visit Summary was printed and given to the patient.  Return in about 1 year (around 09/07/2021) for annual CPE (fasting).  Signed:  Crissie Sickles, MD           09/07/2020   ADDENDUM 09/14/20: Reviewed nephrology labs from 09/01/20;  CBC normal, CMET normal except Ca++ 8.1, Phos 5.0. UA and iron panel normal.  Normal microalbumin/cr. Results abstracted. Signed:  Crissie Sickles, MD           09/14/2020

## 2020-12-01 ENCOUNTER — Ambulatory Visit: Payer: Managed Care, Other (non HMO) | Admitting: Endocrinology

## 2020-12-14 LAB — BASIC METABOLIC PANEL: Creatinine: 0.8 (ref 0.5–1.1)

## 2020-12-27 ENCOUNTER — Other Ambulatory Visit: Payer: Self-pay | Admitting: Endocrinology

## 2020-12-27 DIAGNOSIS — E209 Hypoparathyroidism, unspecified: Secondary | ICD-10-CM

## 2021-01-04 ENCOUNTER — Ambulatory Visit (INDEPENDENT_AMBULATORY_CARE_PROVIDER_SITE_OTHER): Payer: Managed Care, Other (non HMO) | Admitting: Endocrinology

## 2021-01-04 ENCOUNTER — Other Ambulatory Visit: Payer: Self-pay

## 2021-01-04 VITALS — BP 132/70 | HR 82 | Ht 68.0 in | Wt 152.8 lb

## 2021-01-04 DIAGNOSIS — I498 Other specified cardiac arrhythmias: Secondary | ICD-10-CM

## 2021-01-04 DIAGNOSIS — E209 Hypoparathyroidism, unspecified: Secondary | ICD-10-CM | POA: Diagnosis not present

## 2021-01-04 DIAGNOSIS — G90A Postural orthostatic tachycardia syndrome (POTS): Secondary | ICD-10-CM

## 2021-01-04 DIAGNOSIS — D509 Iron deficiency anemia, unspecified: Secondary | ICD-10-CM | POA: Diagnosis not present

## 2021-01-04 DIAGNOSIS — D649 Anemia, unspecified: Secondary | ICD-10-CM | POA: Insufficient documentation

## 2021-01-04 NOTE — Patient Instructions (Addendum)
Blood tests are requested for you today.  We'll let you know about the results.  Please come back for a follow-up appointment in 6 months, or sooner if the pregnancy happens.

## 2021-01-04 NOTE — Progress Notes (Signed)
Subjective:    Patient ID: Alisha Copeland, female    DOB: [birth year], 35 y.o.   MRN: [id number]  HPI Pt returns for f/u of primary hypoparathyroidism (dx'ed at age 60; she has been on rocaltrol since then; at age 63, pt says she was hospitalized in ICU for ARF, due to overtreatment/hypercalcemia; she has no h/o adrenal dz, infertility, vitiligo, urolithiasis, ceilac disease, yeast infections, pernicious anemia, or liver disease; pt says she had genetic testing many years ago, but no hereditary syndrome was found; she declined Natpara when it was available; she takes a multivitamin, but not a dedicated Vit-D supplement--just PNV). She takes rocaltrol 2x0.5 mcg/d, except she was out x a few days last week.  Other than that time, pt states she feels well in general.  She is considering another pregnancy soon.   Past Medical History:  Diagnosis Date  . Allergic rhinitis   . Anemia    needed transfusion in preg 2020  . Celiac artery stenosis (HCC)    + SMA stenosis->detected during w/u for poss secondary HTN (looking for RAS). ? congenital. No sign of vasculitis or RAS.  Marland Kitchen GERD (gastroesophageal reflux disease)   . Hypertension    multiple w/u's for secondary HTN unrevealing/NEG  . Idiopathic hypoparathyroidism ([place])    formerly followed by peds endo at [facility]. Dr. Loanne Drilling as adult.  . Migraine syndrome    infrequent.  Remote hx of effexor for proph, +imitrex abortive med.  HAs infrequent.  . Mild intermittent asthma   . POTS (postural orthostatic tachycardia syndrome)     Past Surgical History:  Procedure Laterality Date  . CESAREAN SECTION N/A 07/25/2019   Procedure: CESAREAN SECTION;  Surgeon: Tyson Dense, MD;  Location: [facility] LD ORS;  Service: Obstetrics;  Laterality: N/A;  . TRANSTHORACIC ECHOCARDIOGRAM  07/24/2017   NORMAL  . US KIDNEY BILATERAL ([place] HX)  01/26/2017   NO MASS OR HYDRO. LEFT KIDNEY 11.5CM. NO MASS OR HYDRO    Social History   Socioeconomic History  .  Marital status: Married    Spouse name: Not on file  . Number of children: Not on file  . Years of education: Not on file  . Highest education level: Not on file  Occupational History  . Not on file  Tobacco Use  . Smoking status: Never Smoker  . Smokeless tobacco: Never Used  Vaping Use  . Vaping Use: Never used  Substance and Sexual Activity  . Alcohol use: Yes    Comment: occassionaly  . Drug use: No  . Sexual activity: Yes  Other Topics Concern  . Not on file  Social History Narrative   Married, 1 son as of 08/2020.   [place] native.   Educ: BA ([employer], [employer], [employer], [employer]).   Occup: pre-K teacher   No tob.   Rare ETOH.   Social Determinants of Health   Financial Resource Strain: Not on file  Food Insecurity: Not on file  Transportation Needs: Not on file  Physical Activity: Not on file  Stress: Not on file  Social Connections: Not on file  Intimate Partner Violence: Not on file    Current Outpatient Medications on File Prior to Visit  Medication Sig Dispense Refill  . albuterol (PROAIR HFA) 108 (90 Base) MCG/ACT inhaler Inhale 1-2 puffs into the lungs every 6 (six) hours as needed for wheezing or shortness of breath. 1 each 1  . Prenatal MV & Min w/FA-DHA (PRENATAL ADULT GUMMY/DHA/FA PO) Take 2 tablets  by mouth every evening.     No current facility-administered medications on file prior to visit.    Allergies  Allergen Reactions  . Beta Adrenergic Blockers     Pt noted old and is on labetolol currently   . Cisapride Other (See Comments)    Prolonged QT syndrome  . Methylphenidate Hcl Other (See Comments)    Increased tachycardia  . Nitrofurantoin Nausea And Vomiting  . Propranolol Other (See Comments)    Lethargy and fatigue  . Sertraline Hcl Diarrhea  . Sulfamethoxazole-Trimethoprim Nausea And Vomiting  . Amphetamine Palpitations  . Ciprofloxacin Hives and Rash  . Dextroamphetamine Palpitations  . Mononessa [Norgestimate-Eth Estradiol]  Rash  . Sulfa Antibiotics Rash    Other reaction(s): GI Intolerance  . Vancomycin Hives and Rash    Family History  Problem Relation Age of Onset  . Hypertension Father   . Pancreatic cancer Father   . Depression Father   . Heart attack Father   . Irritable bowel syndrome Mother   . Hypertension Mother   . Asthma Mother   . Hypertension Paternal Grandfather   . Arthritis Paternal Grandfather   . Heart attack Paternal Grandfather   . Lung cancer Maternal Grandfather   . Hypertension Maternal Grandfather   . Diabetes Maternal Grandmother   . Crohn's disease Maternal Grandmother   . Arthritis Maternal Grandmother   . Depression Maternal Grandmother   . Hyperlipidemia Maternal Grandmother   . Hypertension Maternal Grandmother   . Kidney disease Maternal Grandmother   . Mental illness Maternal Grandmother   . Arthritis Paternal Grandmother   . Hearing loss Paternal Grandmother   . Hypertension Paternal Grandmother   . Crohn's disease Paternal Grandmother   . Miscarriages / Stillbirths Sister   . Asthma Brother   . Colon cancer Neg Hx   . Rectal cancer Neg Hx   . Stomach cancer Neg Hx   . Other Neg Hx        hypoparathyroidism    BP 132/70 (BP Location: Right Arm, Patient Position: Sitting, Cuff Size: Normal)   Pulse 82   Ht 5\' 8"  (1.727 m)   Wt 152 lb 12.8 oz (69.3 kg)   SpO2 99%   BMI 23.23 kg/m    Review of Systems She has slight bilat foot numbness, but she has slight muscle cramps.  Palpitations and lightheadedness are much less the past few years.      Objective:   Physical Exam VITAL SIGNS:  See vs page GENERAL: no distress EXT: no leg edema.    25-OH Vit-D=23 Lab Results  Component Value Date   PTH 8 (L) 05/31/2020   CALCIUM 8.3 (L) 01/04/2021   CAION 0.89 (L) 12/16/2015   PHOS 4.9 (H) 01/04/2021       Assessment & Plan:  Vit-D def: uncontrolled.  Take 1000 units per day Hypoparathyroidism: uncontrolled.  The above will help, but goal Ca++ is  in the 8's Lightheadedness: check cortisol

## 2021-01-05 LAB — CBC WITH DIFFERENTIAL/PLATELET
Basophils Absolute: 0.1 K/uL (ref 0.0–0.1)
Basophils Relative: 1.1 % (ref 0.0–3.0)
Eosinophils Absolute: 0.1 K/uL (ref 0.0–0.7)
Eosinophils Relative: 1.6 % (ref 0.0–5.0)
HCT: 37.3 % (ref 36.0–46.0)
Hemoglobin: 12.8 g/dL (ref 12.0–15.0)
Lymphocytes Relative: 33.1 % (ref 12.0–46.0)
Lymphs Abs: 2.5 K/uL (ref 0.7–4.0)
MCHC: 34.4 g/dL (ref 30.0–36.0)
MCV: 85.3 fl (ref 78.0–100.0)
Monocytes Absolute: 0.5 K/uL (ref 0.1–1.0)
Monocytes Relative: 6.9 % (ref 3.0–12.0)
Neutro Abs: 4.3 K/uL (ref 1.4–7.7)
Neutrophils Relative %: 57.3 % (ref 43.0–77.0)
Platelets: 246 K/uL (ref 150.0–400.0)
RBC: 4.37 Mil/uL (ref 3.87–5.11)
RDW: 13.4 % (ref 11.5–15.5)
WBC: 7.5 K/uL (ref 4.0–10.5)

## 2021-01-05 LAB — IBC PANEL
Iron: 61 ug/dL (ref 42–145)
Saturation Ratios: 17.4 % — ABNORMAL LOW (ref 20.0–50.0)
Transferrin: 250 mg/dL (ref 212.0–360.0)

## 2021-01-05 LAB — TSH: TSH: 1.49 u[IU]/mL (ref 0.35–4.50)

## 2021-01-05 LAB — BASIC METABOLIC PANEL WITH GFR
BUN: 18 mg/dL (ref 6–23)
CO2: 26 meq/L (ref 19–32)
Calcium: 8.3 mg/dL — ABNORMAL LOW (ref 8.4–10.5)
Chloride: 100 meq/L (ref 96–112)
Creatinine, Ser: 0.75 mg/dL (ref 0.40–1.20)
GFR: 103.73 mL/min
Glucose, Bld: 89 mg/dL (ref 70–99)
Potassium: 3.8 meq/L (ref 3.5–5.1)
Sodium: 137 meq/L (ref 135–145)

## 2021-01-05 LAB — VITAMIN D 25 HYDROXY (VIT D DEFICIENCY, FRACTURES): VITD: 23.81 ng/mL — ABNORMAL LOW (ref 30.00–100.00)

## 2021-01-05 LAB — VITAMIN B12: Vitamin B-12: 392 pg/mL (ref 211–911)

## 2021-01-05 LAB — CORTISOL: Cortisol, Plasma: 3.8 ug/dL

## 2021-01-05 LAB — MAGNESIUM: Magnesium: 1.8 mg/dL (ref 1.5–2.5)

## 2021-01-05 LAB — PHOSPHORUS: Phosphorus: 4.9 mg/dL — ABNORMAL HIGH (ref 2.3–4.6)

## 2021-01-08 MED ORDER — CALCITRIOL 0.5 MCG PO CAPS: ORAL_CAPSULE | ORAL | 3 refills | Status: DC

## 2021-03-08 ENCOUNTER — Encounter: Payer: Self-pay | Admitting: Family

## 2021-03-08 ENCOUNTER — Other Ambulatory Visit: Payer: Self-pay

## 2021-03-08 ENCOUNTER — Ambulatory Visit (INDEPENDENT_AMBULATORY_CARE_PROVIDER_SITE_OTHER): Payer: Managed Care, Other (non HMO) | Admitting: Family

## 2021-03-08 DIAGNOSIS — L255 Unspecified contact dermatitis due to plants, except food: Secondary | ICD-10-CM

## 2021-03-08 NOTE — Patient Instructions (Addendum)
Apply hydrocortisone cream 2-3 times daily to affected area using care not to get it in your eyes.  Switch from Allegra to Claritin. You may use benadryl as needed for itching.

## 2021-03-08 NOTE — Progress Notes (Signed)
Subjective:    Patient ID: Alisha Copeland, female    DOB: 15-Nov-1985, 35 y.o.   MRN: [id number]  HPI  Patient presents today with chief complaint of facial rash.  Rash began on 5/22. She reports that the rash has been pruritic. She has been using benadryl for itching.  She also reports that 2 days prior to developing rash she was doing some yard work outside.   BP Readings from Last 3 Encounters:  03/08/21 (!) 142/100  01/04/21 132/70  09/07/20 (!) 137/92   She also reports that she and her husband are actively trying to conceive. Also, she has been using allegra once daily.   Review of Systems See HPI  Past Medical History:  Diagnosis Date  . Allergic rhinitis   . Anemia    needed transfusion in preg 2020  . Celiac artery stenosis (HCC)    + SMA stenosis->detected during w/u for poss secondary HTN (looking for RAS). ? congenital. No sign of vasculitis or RAS.  Marland Kitchen GERD (gastroesophageal reflux disease)   . Hypertension    multiple w/u's for secondary HTN unrevealing/NEG  . Idiopathic hypoparathyroidism ([place])    formerly followed by peds endo at [facility]. Dr. Loanne Drilling as adult.  . Migraine syndrome    infrequent.  Remote hx of effexor for proph, +imitrex abortive med.  HAs infrequent.  . Mild intermittent asthma   . POTS (postural orthostatic tachycardia syndrome)      Social History   Socioeconomic History  . Marital status: Married    Spouse name: Not on file  . Number of children: Not on file  . Years of education: Not on file  . Highest education level: Not on file  Occupational History  . Not on file  Tobacco Use  . Smoking status: Never Smoker  . Smokeless tobacco: Never Used  Vaping Use  . Vaping Use: Never used  Substance and Sexual Activity  . Alcohol use: Yes    Comment: occassionaly  . Drug use: No  . Sexual activity: Yes  Other Topics Concern  . Not on file  Social History Narrative   Married, 1 son as of 08/2020.   [place] native.   Educ: BA  ([employer], [employer], [employer], [employer]).   Occup: pre-K teacher   No tob.   Rare ETOH.   Social Determinants of Health   Financial Resource Strain: Not on file  Food Insecurity: Not on file  Transportation Needs: Not on file  Physical Activity: Not on file  Stress: Not on file  Social Connections: Not on file  Intimate Partner Violence: Not on file    Past Surgical History:  Procedure Laterality Date  . CESAREAN SECTION N/A 07/25/2019   Procedure: CESAREAN SECTION;  Surgeon: Tyson Dense, MD;  Location: [facility] LD ORS;  Service: Obstetrics;  Laterality: N/A;  . TRANSTHORACIC ECHOCARDIOGRAM  07/24/2017   NORMAL  . US KIDNEY BILATERAL ([place] HX)  01/26/2017   NO MASS OR HYDRO. LEFT KIDNEY 11.5CM. NO MASS OR HYDRO    Family History  Problem Relation Age of Onset  . Hypertension Father   . Pancreatic cancer Father   . Depression Father   . Heart attack Father   . Irritable bowel syndrome Mother   . Hypertension Mother   . Asthma Mother   . Hypertension Paternal Grandfather   . Arthritis Paternal Grandfather   . Heart attack Paternal Grandfather   . Lung cancer Maternal Grandfather   . Hypertension Maternal Grandfather   .  Diabetes Maternal Grandmother   . Crohn's disease Maternal Grandmother   . Arthritis Maternal Grandmother   . Depression Maternal Grandmother   . Hyperlipidemia Maternal Grandmother   . Hypertension Maternal Grandmother   . Kidney disease Maternal Grandmother   . Mental illness Maternal Grandmother   . Arthritis Paternal Grandmother   . Hearing loss Paternal Grandmother   . Hypertension Paternal Grandmother   . Crohn's disease Paternal Grandmother   . Miscarriages / Stillbirths Sister   . Asthma Brother   . Colon cancer Neg Hx   . Rectal cancer Neg Hx   . Stomach cancer Neg Hx   . Other Neg Hx        hypoparathyroidism    Allergies  Allergen Reactions  . Beta Adrenergic Blockers     Pt noted old and is on labetolol currently   .  Cisapride Other (See Comments)    Prolonged QT syndrome  . Methylphenidate Hcl Other (See Comments)    Increased tachycardia  . Nitrofurantoin Nausea And Vomiting  . Propranolol Other (See Comments)    Lethargy and fatigue  . Sertraline Hcl Diarrhea  . Sulfamethoxazole-Trimethoprim Nausea And Vomiting  . Amphetamine Palpitations  . Ciprofloxacin Hives and Rash  . Dextroamphetamine Palpitations  . Mononessa [Norgestimate-Eth Estradiol] Rash  . Sulfa Antibiotics Rash    Other reaction(s): GI Intolerance  . Vancomycin Hives and Rash    Current Outpatient Medications on File Prior to Visit  Medication Sig Dispense Refill  . albuterol (PROAIR HFA) 108 (90 Base) MCG/ACT inhaler Inhale 1-2 puffs into the lungs every 6 (six) hours as needed for wheezing or shortness of breath. 1 each 1  . calcitRIOL (ROCALTROL) 0.5 MCG capsule TAKE FOUR CAPSULES BY MOUTH DAILY 360 capsule 3  . Prenatal MV & Min w/FA-DHA (PRENATAL ADULT GUMMY/DHA/FA PO) Take 2 tablets by mouth every evening.     No current facility-administered medications on file prior to visit.    BP (!) 142/100 (BP Location: Right Arm, Patient Position: Sitting, Cuff Size: Small)   Pulse 78   Temp 98.5 F (36.9 C) (Oral)   Resp 16   Wt 153 lb (69.4 kg)   SpO2 100%   BMI 23.26 kg/m       Objective:   Physical Exam Constitutional:      Appearance: Normal appearance.  HENT:     Head: Normocephalic and atraumatic.  Skin:    Comments: Mild erythematous rash noted beneath both eyes  Neurological:     Mental Status: She is alert.  Psychiatric:        Attention and Perception: Attention normal.        Mood and Affect: Mood and affect normal.        Speech: Speech normal.        Behavior: Behavior normal.        Cognition and Memory: Cognition normal.           Assessment & Plan:

## 2021-03-09 DIAGNOSIS — L255 Unspecified contact dermatitis due to plants, except food: Secondary | ICD-10-CM | POA: Insufficient documentation

## 2021-03-09 NOTE — Assessment & Plan Note (Signed)
New.  Will avoid oral steroids as they are category C.  Recommended topical hydrocortisone using care to avoid getting in her eyes.  Also recommended that she change allegra (category C) to Claritin (category B). For breakthrough itching she can use benadryl PO.  She is advised to call if symptoms worsen or if symptoms do not improve.

## 2021-03-16 ENCOUNTER — Encounter: Payer: Self-pay | Admitting: Family Medicine

## 2021-04-20 LAB — OB RESULTS CONSOLE ABO/RH: RH Type: NEGATIVE

## 2021-04-20 LAB — OB RESULTS CONSOLE RPR: RPR: NONREACTIVE

## 2021-04-20 LAB — OB RESULTS CONSOLE GC/CHLAMYDIA
Chlamydia: NEGATIVE
Gonorrhea: NEGATIVE

## 2021-04-20 LAB — OB RESULTS CONSOLE HEPATITIS B SURFACE ANTIGEN: Hepatitis B Surface Ag: NEGATIVE

## 2021-04-20 LAB — OB RESULTS CONSOLE HIV ANTIBODY (ROUTINE TESTING): HIV: NONREACTIVE

## 2021-04-20 LAB — OB RESULTS CONSOLE RUBELLA ANTIBODY, IGM: Rubella: IMMUNE

## 2021-04-20 LAB — OB RESULTS CONSOLE ANTIBODY SCREEN: Antibody Screen: NEGATIVE

## 2021-04-20 LAB — HEPATITIS C ANTIBODY: HCV Ab: NEGATIVE

## 2021-04-28 ENCOUNTER — Encounter: Payer: Self-pay | Admitting: Endocrinology

## 2021-04-29 NOTE — Telephone Encounter (Signed)
There is already a patient scheduled at 430 PM on May 03, 2021 - patient was not able to come to the 05/02/21 open appointments and says that she needs an appointment 345 PM or after.  Please advise

## 2021-05-03 NOTE — Telephone Encounter (Signed)
LM to schedule appt for next available

## 2021-06-01 ENCOUNTER — Ambulatory Visit (INDEPENDENT_AMBULATORY_CARE_PROVIDER_SITE_OTHER): Payer: Managed Care, Other (non HMO) | Admitting: Endocrinology

## 2021-06-01 ENCOUNTER — Other Ambulatory Visit: Payer: Self-pay

## 2021-06-01 VITALS — BP 130/80 | HR 90 | Ht 68.0 in | Wt 151.8 lb

## 2021-06-01 DIAGNOSIS — E209 Hypoparathyroidism, unspecified: Secondary | ICD-10-CM

## 2021-06-01 MED ORDER — VITAMIN D3 50 MCG (2000 UT) PO CAPS: 2000.0000 [IU] | ORAL_CAPSULE | Freq: Every day | ORAL | 3 refills | Status: DC

## 2021-06-01 NOTE — Progress Notes (Signed)
Subjective:    Patient ID: Alisha Copeland, female    DOB: 1986-06-01, 35 y.o.   MRN: [id number]  HPI Pt returns for f/u of primary hypoparathyroidism (dx'ed at age 36; she has been on rocaltrol since then; at age 72, pt says she was hospitalized in ICU for ARF, due to overtreatment/hypercalcemia; she has no h/o adrenal dz, infertility, vitiligo, urolithiasis, ceilac disease, yeast infections, pernicious anemia, or liver disease; pt says she had genetic testing many years ago, but no hereditary syndrome was found; she declined Natpara when it was available; she takes a multivitamin, but not a dedicated Vit-D supplement--just PNV). She is now at 14 5/7 weeks.  Pregnancy is going fine.  Nausea is now mild.  She has not recently taken Vit-D.  She says nephrol last week found Vit-D to be low, and Ca++ was 8.2.   Past Medical History:  Diagnosis Date   Allergic rhinitis    Anemia    needed transfusion in preg 2020   Celiac artery stenosis (HCC)    + SMA stenosis->detected during w/u for poss secondary HTN (looking for RAS). ? congenital. No sign of vasculitis or RAS.   GERD (gastroesophageal reflux disease)    History of iron deficiency anemia    +hx of iron infusion   Hypertension    multiple w/u's for secondary HTN unrevealing/NEG   Idiopathic hypoparathyroidism ([place])    formerly followed by peds endo at [facility]. Dr. Loanne Drilling as adult.   Migraine syndrome    infrequent.  Remote hx of effexor for proph, +imitrex abortive med.  HAs infrequent.   Mild intermittent asthma    POTS (postural orthostatic tachycardia syndrome)     Past Surgical History:  Procedure Laterality Date   CESAREAN SECTION N/A 07/25/2019   Procedure: CESAREAN SECTION;  Surgeon: Tyson Dense, MD;  Location: [facility] LD ORS;  Service: Obstetrics;  Laterality: N/A;   TRANSTHORACIC ECHOCARDIOGRAM  07/24/2017   NORMAL   US KIDNEY BILATERAL ([place] HX)  01/26/2017   NO MASS OR HYDRO. LEFT KIDNEY 11.5CM. NO MASS OR HYDRO     Social History   Socioeconomic History   Marital status: Married    Spouse name: Not on file   Number of children: Not on file   Years of education: Not on file   Highest education level: Not on file  Occupational History   Not on file  Tobacco Use   Smoking status: Never   Smokeless tobacco: Never  Vaping Use   Vaping Use: Never used  Substance and Sexual Activity   Alcohol use: Yes    Comment: occassionaly   Drug use: No   Sexual activity: Yes  Other Topics Concern   Not on file  Social History Narrative   Married, 1 son as of 08/2020.    native.   Educ: BA ([employer], [employer], [employer], [employer]).   Occup: pre-K teacher   No tob.   Rare ETOH.   Social Determinants of Health   Financial Resource Strain: Not on file  Food Insecurity: Not on file  Transportation Needs: Not on file  Physical Activity: Not on file  Stress: Not on file  Social Connections: Not on file  Intimate Partner Violence: Not on file    Current Outpatient Medications on File Prior to Visit  Medication Sig Dispense Refill   albuterol (PROAIR HFA) 108 (90 Base) MCG/ACT inhaler Inhale 1-2 puffs into the lungs every 6 (six) hours as needed for wheezing or shortness of breath.  1 each 1   calcitRIOL (ROCALTROL) 0.5 MCG capsule TAKE FOUR CAPSULES BY MOUTH DAILY 360 capsule 3   Prenatal MV & Min w/FA-DHA (PRENATAL ADULT GUMMY/DHA/FA PO) Take 2 tablets by mouth every evening.     No current facility-administered medications on file prior to visit.    Allergies  Allergen Reactions   Beta Adrenergic Blockers     Pt noted old and is on labetolol currently    Cisapride Other (See Comments)    Prolonged QT syndrome   Methylphenidate Hcl Other (See Comments)    Increased tachycardia   Nitrofurantoin Nausea And Vomiting   Propranolol Other (See Comments)    Lethargy and fatigue   Sertraline Hcl Diarrhea   Sulfamethoxazole-Trimethoprim Nausea And Vomiting   Amphetamine Palpitations    Ciprofloxacin Hives and Rash   Dextroamphetamine Palpitations   Mononessa [Norgestimate-Eth Estradiol] Rash   Sulfa Antibiotics Rash    Other reaction(s): GI Intolerance   Vancomycin Hives and Rash    Family History  Problem Relation Age of Onset   Hypertension Father    Pancreatic cancer Father    Depression Father    Heart attack Father    Irritable bowel syndrome Mother    Hypertension Mother    Asthma Mother    Hypertension Paternal Grandfather    Arthritis Paternal Grandfather    Heart attack Paternal Grandfather    Lung cancer Maternal Grandfather    Hypertension Maternal Grandfather    Diabetes Maternal Grandmother    Crohn's disease Maternal Grandmother    Arthritis Maternal Grandmother    Depression Maternal Grandmother    Hyperlipidemia Maternal Grandmother    Hypertension Maternal Grandmother    Kidney disease Maternal Grandmother    Mental illness Maternal Grandmother    Arthritis Paternal Grandmother    Hearing loss Paternal Grandmother    Hypertension Paternal Grandmother    Crohn's disease Paternal 25 / Stillbirths Sister    Asthma Brother    Colon cancer Neg Hx    Rectal cancer Neg Hx    Stomach cancer Neg Hx    Other Neg Hx        hypoparathyroidism    BP 130/80 (BP Location: Right Arm, Patient Position: Sitting, Cuff Size: Normal)   Pulse 90   Ht '5\' 8"'$  (1.727 m)   Wt 151 lb 12.8 oz (68.9 kg)   SpO2 98%   BMI 23.08 kg/m    Review of Systems Denies muscle cramps and numbness.      Objective:   Physical Exam VITAL SIGNS:  See vs page GENERAL: no distress.     Lab Results  Component Value Date   TSH 1.49 01/04/2021      Assessment & Plan:  Vit-D def: uncontrolled.   Hypoparathyroidism, during pregnancy.  due for recheck soon.   Patient Instructions  Please start taking vitamin-D, 2000 units per day. Please continue the same calcitriol.   Blood tests are requested for you today.  Please do in 2-4 weeks.   We'll let you know about the results.  Please come back for a follow-up appointment in 3 months.

## 2021-06-01 NOTE — Patient Instructions (Addendum)
Please start taking vitamin-D, 2000 units per day. Please continue the same calcitriol.   Blood tests are requested for you today.  Please do in 2-4 weeks.  We'll let you know about the results.  Please come back for a follow-up appointment in 3 months.

## 2021-06-15 ENCOUNTER — Other Ambulatory Visit: Payer: Managed Care, Other (non HMO)

## 2021-06-16 ENCOUNTER — Other Ambulatory Visit: Payer: Managed Care, Other (non HMO)

## 2021-06-17 ENCOUNTER — Other Ambulatory Visit: Payer: Self-pay

## 2021-06-17 ENCOUNTER — Other Ambulatory Visit (INDEPENDENT_AMBULATORY_CARE_PROVIDER_SITE_OTHER): Payer: Managed Care, Other (non HMO)

## 2021-06-17 DIAGNOSIS — E209 Hypoparathyroidism, unspecified: Secondary | ICD-10-CM | POA: Diagnosis not present

## 2021-06-17 LAB — BASIC METABOLIC PANEL
BUN: 11 mg/dL (ref 6–23)
CO2: 25 mEq/L (ref 19–32)
Calcium: 8.9 mg/dL (ref 8.4–10.5)
Chloride: 103 mEq/L (ref 96–112)
Creatinine, Ser: 0.5 mg/dL (ref 0.40–1.20)
GFR: 121.82 mL/min (ref >60.00)
Glucose, Bld: 108 mg/dL — ABNORMAL HIGH (ref 70–99)
Potassium: 3.4 mEq/L — ABNORMAL LOW (ref 3.5–5.1)
Sodium: 138 mEq/L (ref 135–145)

## 2021-06-17 LAB — T4, FREE: Free T4: 0.88 ng/dL (ref 0.60–1.60)

## 2021-06-17 LAB — VITAMIN D 25 HYDROXY (VIT D DEFICIENCY, FRACTURES): VITD: 23.07 ng/mL — ABNORMAL LOW (ref 30.00–100.00)

## 2021-06-21 LAB — THYROGLOBULIN LEVEL: Thyroglobulin: 18.5 ng/mL

## 2021-07-07 ENCOUNTER — Ambulatory Visit: Payer: Managed Care, Other (non HMO) | Admitting: Endocrinology

## 2021-07-07 LAB — COMPREHENSIVE METABOLIC PANEL: Calcium: 8.9 (ref 8.7–10.7)

## 2021-07-26 ENCOUNTER — Encounter: Payer: Self-pay | Admitting: Family Medicine

## 2021-08-29 LAB — BASIC METABOLIC PANEL
BUN: 10 (ref 4–21)
CO2: 25 — AB (ref 13–22)
Chloride: 100 (ref 99–108)
Creatinine: 0.6 (ref 0.5–1.1)
Glucose: 85
Potassium: 3.9 (ref 3.4–5.3)
Sodium: 135 — AB (ref 137–147)

## 2021-08-29 LAB — HEPATIC FUNCTION PANEL
ALT: 13 (ref 7–35)
AST: 14 (ref 13–35)
Alkaline Phosphatase: 82 (ref 25–125)
Bilirubin, Total: 0.2

## 2021-08-29 LAB — COMPREHENSIVE METABOLIC PANEL
Albumin: 3.5 (ref 3.5–5.0)
Calcium: 9.1 (ref 8.7–10.7)
Globulin: 2.1

## 2021-09-01 ENCOUNTER — Ambulatory Visit: Payer: Managed Care, Other (non HMO) | Admitting: Endocrinology

## 2021-09-12 ENCOUNTER — Encounter: Payer: Managed Care, Other (non HMO) | Admitting: Family Medicine

## 2021-09-26 ENCOUNTER — Ambulatory Visit: Payer: Managed Care, Other (non HMO) | Admitting: Endocrinology

## 2021-09-30 ENCOUNTER — Other Ambulatory Visit: Payer: Self-pay

## 2021-09-30 ENCOUNTER — Ambulatory Visit (INDEPENDENT_AMBULATORY_CARE_PROVIDER_SITE_OTHER): Payer: Managed Care, Other (non HMO) | Admitting: Endocrinology

## 2021-09-30 VITALS — BP 118/90 | HR 98 | Ht 68.0 in | Wt 177.0 lb

## 2021-09-30 DIAGNOSIS — E209 Hypoparathyroidism, unspecified: Secondary | ICD-10-CM | POA: Diagnosis not present

## 2021-09-30 LAB — BASIC METABOLIC PANEL
BUN: 10 mg/dL (ref 6–23)
CO2: 21 mEq/L (ref 19–32)
Calcium: 7.5 mg/dL — ABNORMAL LOW (ref 8.4–10.5)
Chloride: 103 mEq/L (ref 96–112)
Creatinine, Ser: 0.55 mg/dL (ref 0.40–1.20)
GFR: 118.81 mL/min (ref >60.00)
Glucose, Bld: 67 mg/dL — ABNORMAL LOW (ref 70–99)
Potassium: 3.6 mEq/L (ref 3.5–5.1)
Sodium: 135 mEq/L (ref 135–145)

## 2021-09-30 LAB — TSH: TSH: 0.67 u[IU]/mL (ref 0.35–5.50)

## 2021-09-30 LAB — VITAMIN D 25 HYDROXY (VIT D DEFICIENCY, FRACTURES): VITD: 34.55 ng/mL (ref 30.00–100.00)

## 2021-09-30 LAB — T4, FREE: Free T4: 0.74 ng/dL (ref 0.60–1.60)

## 2021-09-30 MED ORDER — CALCITRIOL 0.5 MCG PO CAPS: 3.0000 ug | ORAL_CAPSULE | Freq: Every day | ORAL | 3 refills | Status: DC

## 2021-09-30 NOTE — Patient Instructions (Addendum)
Blood tests are requested for you today.  We'll let you know about the results.    Please come back for a follow-up appointment in 4 months.   

## 2021-09-30 NOTE — Progress Notes (Signed)
Subjective:    Patient ID: Alisha Copeland, female    DOB: [birth year], 35 y.o.   MRN: [id number]  HPI Pt returns for f/u of primary hypoparathyroidism (dx'ed at age 37; she has been on rocaltrol since then; at age 42, pt says she was hospitalized in ICU for ARF, due to overtreatment/hypercalcemia; she has no h/o adrenal dz, infertility, vitiligo, urolithiasis, ceilac disease, yeast infections, pernicious anemia, or liver disease; pt says she had genetic testing many years ago, but no hereditary syndrome was found; she declined Natpara when it was available; she takes PNV). She is now at 32 weeks.  Pregnancy is going fine.  HB is mild.  She  takes Vit-D 4000 units/d, as rx'ed.  She has gained 25 lbs.  She has foot cramps.  OGTT was good.   Past Medical History:  Diagnosis Date   Allergic rhinitis    Celiac artery stenosis (HCC)    + SMA stenosis->detected during w/u for poss secondary HTN (looking for RAS). ? congenital. No sign of vasculitis or RAS.   GERD (gastroesophageal reflux disease)    History of iron deficiency anemia    +hx of iron infusion, hx of transfusion 2020 pregnancy   Hypertension    multiple w/u's for secondary HTN unrevealing/NEG   Idiopathic hypoparathyroidism ([place])    formerly followed by peds endo at [facility]. Dr. Loanne Drilling as adult.   Migraine syndrome    infrequent.  Remote hx of effexor for proph, +imitrex abortive med.  HAs infrequent.   Mild intermittent asthma    POTS (postural orthostatic tachycardia syndrome)     Past Surgical History:  Procedure Laterality Date   CESAREAN SECTION N/A 07/25/2019   Procedure: CESAREAN SECTION;  Surgeon: Tyson Dense, MD;  Location: [facility] LD ORS;  Service: Obstetrics;  Laterality: N/A;   TRANSTHORACIC ECHOCARDIOGRAM  07/24/2017   NORMAL   US KIDNEY BILATERAL ([place] HX)  01/26/2017   NO MASS OR HYDRO. LEFT KIDNEY 11.5CM. NO MASS OR HYDRO    Social History   Socioeconomic History   Marital status: Married    Spouse  name: Not on file   Number of children: Not on file   Years of education: Not on file   Highest education level: Not on file  Occupational History   Not on file  Tobacco Use   Smoking status: Never   Smokeless tobacco: Never  Vaping Use   Vaping Use: Never used  Substance and Sexual Activity   Alcohol use: Yes    Comment: occassionaly   Drug use: No   Sexual activity: Yes  Other Topics Concern   Not on file  Social History Narrative   Married, 1 son as of 08/2020.   [place] native.   Educ: BA ([employer], [employer], [employer], [employer]).   Occup: pre-K teacher   No tob.   Rare ETOH.   Social Determinants of Health   Financial Resource Strain: Not on file  Food Insecurity: Not on file  Transportation Needs: Not on file  Physical Activity: Not on file  Stress: Not on file  Social Connections: Not on file  Intimate Partner Violence: Not on file    Current Outpatient Medications on File Prior to Visit  Medication Sig Dispense Refill   aspirin 81 MG chewable tablet Chew 81 mg by mouth daily.     Cholecalciferol (VITAMIN D3) 50 MCG (2000 UT) capsule Take 1 capsule (2,000 Units total) by mouth daily. 100 capsule 3   labetalol (NORMODYNE) 100  MG tablet Take 100 mg by mouth once.     Prenatal MV & Min w/FA-DHA (PRENATAL ADULT GUMMY/DHA/FA PO) Take 2 tablets by mouth every evening.     albuterol (PROAIR HFA) 108 (90 Base) MCG/ACT inhaler Inhale 1-2 puffs into the lungs every 6 (six) hours as needed for wheezing or shortness of breath. 1 each 1   No current facility-administered medications on file prior to visit.    Allergies  Allergen Reactions   Beta Adrenergic Blockers     Pt noted old and is on labetolol currently    Cisapride Other (See Comments)    Prolonged QT syndrome   Methylphenidate Hcl Other (See Comments)    Increased tachycardia   Nitrofurantoin Nausea And Vomiting   Propranolol Other (See Comments)    Lethargy and fatigue   Sertraline Hcl Diarrhea    Sulfamethoxazole-Trimethoprim Nausea And Vomiting   Amphetamine Palpitations   Ciprofloxacin Hives and Rash   Dextroamphetamine Palpitations   Mononessa [Norgestimate-Eth Estradiol] Rash   Sulfa Antibiotics Rash    Other reaction(s): GI Intolerance   Vancomycin Hives and Rash    Family History  Problem Relation Age of Onset   Hypertension Father    Pancreatic cancer Father    Depression Father    Heart attack Father    Irritable bowel syndrome Mother    Hypertension Mother    Asthma Mother    Hypertension Paternal Grandfather    Arthritis Paternal Grandfather    Heart attack Paternal Grandfather    Lung cancer Maternal Grandfather    Hypertension Maternal Grandfather    Diabetes Maternal Grandmother    Crohn's disease Maternal Grandmother    Arthritis Maternal Grandmother    Depression Maternal Grandmother    Hyperlipidemia Maternal Grandmother    Hypertension Maternal Grandmother    Kidney disease Maternal Grandmother    Mental illness Maternal Grandmother    Arthritis Paternal Grandmother    Hearing loss Paternal Grandmother    Hypertension Paternal Grandmother    Crohn's disease Paternal Grandmother    Miscarriages / Stillbirths Sister    Asthma Brother    Colon cancer Neg Hx    Rectal cancer Neg Hx    Stomach cancer Neg Hx    Other Neg Hx        hypoparathyroidism    BP 118/90 (BP Location: Left Arm, Patient Position: Sitting, Cuff Size: Normal)    Pulse 98    Ht 5\' 8"  (1.727 m)    Wt 177 lb (80.3 kg)    SpO2 97%    BMI 26.91 kg/m     Review of Systems     Objective:   Physical Exam VITAL SIGNS:  See vs page GENERAL: no distress NECK: There is no palpable thyroid enlargement.  No thyroid nodule is palpable.  No palpable lymphadenopathy at the anterior neck.     Lab Results  Component Value Date   PTH 8 (L) 05/31/2020   CALCIUM 7.5 (L) 09/30/2021   CAION 0.89 (L) 12/16/2015   PHOS 4.9 (H) 01/04/2021       Assessment & Plan:  Hypoparathyroidism:  uncontrolled.  I have sent a prescription to your pharmacy, to increase rocaltrol.

## 2021-10-28 LAB — OB RESULTS CONSOLE GBS: GBS: NEGATIVE

## 2021-11-04 ENCOUNTER — Encounter (HOSPITAL_COMMUNITY): Payer: Self-pay

## 2021-11-04 ENCOUNTER — Encounter (HOSPITAL_COMMUNITY): Payer: Self-pay | Admitting: *Deleted

## 2021-11-04 NOTE — Patient Instructions (Signed)
Alisha Copeland  11/04/2021   Your procedure is scheduled on:  11/18/2021  Arrive at [place] at Entrance C on [address] at [facility]  and [address]. You are invited to use the FREE valet parking or use the Visitor's parking deck.  Pick up the phone at the desk and dial [phone].  Call this number if you have problems the morning of surgery: [phone]  Remember:   Do not eat food:(After Midnight) Desps de medianoche.  Do not drink clear liquids: (After Midnight) Desps de medianoche.  Take these medicines the morning of surgery with A SIP OF WATER:  Take labetalol as prescribed   Do not wear jewelry, make-up or nail polish.  Do not wear lotions, powders, or perfumes. Do not wear deodorant.  Do not shave 48 hours prior to surgery.  Do not bring valuables to the hospital.  [facility] is not   responsible for any belongings or valuables brought to the hospital.  Contacts, dentures or bridgework may not be worn into surgery.  Leave suitcase in the car. After surgery it may be brought to your room.  For patients admitted to the hospital, checkout time is 11:00 AM the day of              discharge.      Please read over the following fact sheets that you were given:     Preparing for Surgery

## 2021-11-07 ENCOUNTER — Encounter (HOSPITAL_COMMUNITY): Payer: Self-pay

## 2021-11-10 NOTE — H&P (Signed)
Alisha Copeland is a 36 y.o. female presenting for repeat cesarean section. Pregnancy complicated by chronic hypertension on labetalol 300mg  TID and ASA 81mg  qd. BP in office on 11/09/21 was 146/100 and 150/100, ptnuria negative. She has a history of cesarean section and desires repeat. Also hypoparathyroidism managed by Dr Loanne Drilling, POTS. U/S in office 11/03/21 EFW 7#6oz (83%), AFI 17, BPP 8/8, Vtx. OB History     Gravida  3   Para  1   Term  1   Preterm      AB  1   Living  1      SAB  1   IAB      Ectopic      Multiple      Live Births  1          Past Medical History:  Diagnosis Date   Allergic rhinitis    Anemia    Celiac artery stenosis (HCC)    + SMA stenosis->detected during w/u for poss secondary HTN (looking for RAS). ? congenital. No sign of vasculitis or RAS.   GERD (gastroesophageal reflux disease)    History of iron deficiency anemia    +hx of iron infusion, hx of transfusion 2020 pregnancy   Hypertension    multiple w/u's for secondary HTN unrevealing/NEG   Idiopathic hypoparathyroidism ([place])    formerly followed by peds endo at [facility]. Dr. Loanne Drilling as adult.   Migraine syndrome    infrequent.  Remote hx of effexor for proph, +imitrex abortive med.  HAs infrequent.   Mild intermittent asthma    POTS (postural orthostatic tachycardia syndrome)    Past Surgical History:  Procedure Laterality Date   CESAREAN SECTION N/A 07/25/2019   Procedure: CESAREAN SECTION;  Surgeon: Tyson Dense, MD;  Location: [facility] LD ORS;  Service: Obstetrics;  Laterality: N/A;   TRANSTHORACIC ECHOCARDIOGRAM  07/24/2017   NORMAL   US KIDNEY BILATERAL ([place] HX)  01/26/2017   NO MASS OR HYDRO. LEFT KIDNEY 11.5CM. NO MASS OR HYDRO   Family History: family history includes Arthritis in her maternal grandmother, paternal grandfather, and paternal grandmother; Asthma in her brother and mother; Crohn's disease in her maternal grandmother and paternal grandmother; Depression  in her father and maternal grandmother; Diabetes in her maternal grandmother; Hearing loss in her paternal grandmother; Heart attack in her father and paternal grandfather; Hyperlipidemia in her maternal grandmother; Hypertension in her father, maternal grandfather, maternal grandmother, mother, paternal grandfather, and paternal grandmother; Irritable bowel syndrome in her mother; Kidney disease in her maternal grandmother; Lung cancer in her maternal grandfather; Mental illness in her maternal grandmother; Miscarriages / Stillbirths in her sister; Pancreatic cancer in her father. Social History:  reports that she has never smoked. She has never used smokeless tobacco. She reports current alcohol use. She reports that she does not use drugs.     Maternal Diabetes: No Genetic Screening: Normal Maternal Ultrasounds/Referrals: Normal Fetal Ultrasounds or other Referrals:  None Maternal Substance Abuse:  No Significant Maternal Medications:  Meds include: Other: labetalol 300mg  TID Significant Maternal Lab Results:  Group B Strep negative Other Comments:  None  Review of Systems  Constitutional:  Negative for fever.  Eyes:  Negative for photophobia.  Neurological:  Negative for headaches.  Maternal Medical History:  Fetal activity: Perceived fetal activity is normal.      unknown if currently breastfeeding. Maternal Exam:  Abdomen: Patient reports no abdominal tenderness.  Physical Exam Cardiovascular:     Rate and Rhythm: Normal rate.  Pulmonary:     Effort: Pulmonary effort is normal.    Prenatal labs: ABO, Rh: A/Negative/-- (07/06 0000) Antibody: Negative (07/06 0000) Rubella: Immune (07/06 0000) RPR: Nonreactive (07/06 0000)  HBsAg: Negative (07/06 0000)  HIV: Non-reactive (07/06 0000)  GBS: Negative/-- (01/13 0000)   Assessment/Plan: 36 yo G3P1 @ 38 weeks with chronic hypertension on medication with rising blood pressures Repeat cesarean section.   Shon Millet  II 11/10/2021, 2:51 PM

## 2021-11-10 NOTE — Anesthesia Preprocedure Evaluation (Addendum)
Anesthesia Evaluation  Patient identified by MRN, date of birth, ID band Patient awake    Reviewed: Allergy & Precautions, NPO status , Patient's Chart, lab work & pertinent test results, reviewed documented beta blocker date and time   History of Anesthesia Complications Negative for: history of anesthetic complications  Airway Mallampati: II  TM Distance: >3 FB Neck ROM: Full    Dental no notable dental hx.    Pulmonary asthma ,    Pulmonary exam normal        Cardiovascular hypertension, Pt. on medications and Pt. on home beta blockers Normal cardiovascular exam     Neuro/Psych  Headaches, Depression    GI/Hepatic Neg liver ROS, GERD  Controlled,  Endo/Other  negative endocrine ROS  Renal/GU negative Renal ROS  negative genitourinary   Musculoskeletal negative musculoskeletal ROS (+)   Abdominal   Peds  Hematology negative hematology ROS (+)   Anesthesia Other Findings POTS, Idiopathic hypoparathyroidism  Reproductive/Obstetrics (+) Pregnancy (Hx of C/S x1)                           Anesthesia Physical Anesthesia Plan  ASA: 2  Anesthesia Plan: Spinal   Post-op Pain Management:    Induction:   PONV Risk Score and Plan: 4 or greater and Treatment may vary due to age or medical condition, Ondansetron and Dexamethasone  Airway Management Planned: Natural Airway  Additional Equipment: None  Intra-op Plan:   Post-operative Plan:   Informed Consent: I have reviewed the patients History and Physical, chart, labs and discussed the procedure including the risks, benefits and alternatives for the proposed anesthesia with the patient or authorized representative who has indicated his/her understanding and acceptance.       Plan Discussed with: CRNA  Anesthesia Plan Comments:        Anesthesia Quick Evaluation

## 2021-11-11 ENCOUNTER — Other Ambulatory Visit: Payer: Self-pay

## 2021-11-11 ENCOUNTER — Inpatient Hospital Stay (HOSPITAL_COMMUNITY)
Admission: AD | Admit: 2021-11-11 | Payer: Managed Care, Other (non HMO) | Source: Home / Self Care | Admitting: Obstetrics and Gynecology

## 2021-11-11 ENCOUNTER — Inpatient Hospital Stay (HOSPITAL_COMMUNITY): Payer: Managed Care, Other (non HMO) | Admitting: Anesthesiology

## 2021-11-11 ENCOUNTER — Encounter (HOSPITAL_COMMUNITY): Payer: Self-pay | Admitting: Obstetrics and Gynecology

## 2021-11-11 ENCOUNTER — Inpatient Hospital Stay (HOSPITAL_COMMUNITY)
Admission: RE | Admit: 2021-11-11 | Discharge: 2021-11-13 | DRG: 787 | Disposition: A | Discharge status: Home / Self Care | Payer: Managed Care, Other (non HMO) | Attending: Obstetrics and Gynecology | Admitting: Obstetrics and Gynecology

## 2021-11-11 ENCOUNTER — Encounter (HOSPITAL_COMMUNITY)
Admission: RE | Disposition: A | Discharge status: Home / Self Care | Payer: Self-pay | Source: Home / Self Care | Attending: Obstetrics and Gynecology

## 2021-11-11 DIAGNOSIS — O34211 Maternal care for low transverse scar from previous cesarean delivery: Principal | ICD-10-CM | POA: Diagnosis present

## 2021-11-11 DIAGNOSIS — Z20822 Contact with and (suspected) exposure to covid-19: Secondary | ICD-10-CM | POA: Diagnosis present

## 2021-11-11 DIAGNOSIS — Z3A38 38 weeks gestation of pregnancy: Secondary | ICD-10-CM

## 2021-11-11 DIAGNOSIS — O1002 Pre-existing essential hypertension complicating childbirth: Secondary | ICD-10-CM | POA: Diagnosis present

## 2021-11-11 DIAGNOSIS — O10919 Unspecified pre-existing hypertension complicating pregnancy, unspecified trimester: Secondary | ICD-10-CM | POA: Diagnosis present

## 2021-11-11 DIAGNOSIS — Z01818 Encounter for other preprocedural examination: Secondary | ICD-10-CM

## 2021-11-11 LAB — CBC
HCT: 34.2 % — ABNORMAL LOW (ref 36.0–46.0)
Hemoglobin: 11.7 g/dL — ABNORMAL LOW (ref 12.0–15.0)
MCH: 29.4 pg (ref 26.0–34.0)
MCHC: 34.2 g/dL (ref 30.0–36.0)
MCV: 85.9 fL (ref 80.0–100.0)
Platelets: 196 10*3/uL (ref 150–400)
RBC: 3.98 MIL/uL (ref 3.87–5.11)
RDW: 14.2 % (ref 11.5–15.5)
WBC: 8.9 10*3/uL (ref 4.0–10.5)
nRBC: 0 % (ref 0.0–0.2)

## 2021-11-11 LAB — RESP PANEL BY RT-PCR (FLU A&B, COVID) ARPGX2
Influenza A by PCR: NEGATIVE
Influenza B by PCR: NEGATIVE
SARS Coronavirus 2 by RT PCR: NEGATIVE

## 2021-11-11 LAB — COMPREHENSIVE METABOLIC PANEL
ALT: 18 U/L (ref 0–44)
AST: 22 U/L (ref 15–41)
Albumin: 2.6 g/dL — ABNORMAL LOW (ref 3.5–5.0)
Alkaline Phosphatase: 141 U/L — ABNORMAL HIGH (ref 38–126)
Anion gap: 9 (ref 5–15)
BUN: 19 mg/dL (ref 6–20)
CO2: 21 mmol/L — ABNORMAL LOW (ref 22–32)
Calcium: 8.2 mg/dL — ABNORMAL LOW (ref 8.9–10.3)
Chloride: 106 mmol/L (ref 98–111)
Creatinine, Ser: 0.9 mg/dL (ref 0.44–1.00)
GFR, Estimated: >60 mL/min (ref >60)
Glucose, Bld: 87 mg/dL (ref 70–99)
Potassium: 3.6 mmol/L (ref 3.5–5.1)
Sodium: 136 mmol/L (ref 135–145)
Total Bilirubin: 0.4 mg/dL (ref 0.3–1.2)
Total Protein: 6.2 g/dL — ABNORMAL LOW (ref 6.5–8.1)

## 2021-11-11 LAB — URINALYSIS, ROUTINE W REFLEX MICROSCOPIC
Bilirubin Urine: NEGATIVE
Glucose, UA: NEGATIVE mg/dL
Hgb urine dipstick: NEGATIVE
Ketones, ur: NEGATIVE mg/dL
Leukocytes,Ua: NEGATIVE
Nitrite: NEGATIVE
Protein, ur: NEGATIVE mg/dL
Specific Gravity, Urine: 1.016 (ref 1.005–1.030)
pH: 6 (ref 5.0–8.0)

## 2021-11-11 LAB — TYPE AND SCREEN
ABO/RH(D): A NEG
Antibody Screen: POSITIVE

## 2021-11-11 SURGERY — Surgical Case
Anesthesia: Spinal

## 2021-11-11 MED ORDER — OXYTOCIN-SODIUM CHLORIDE 30-0.9 UT/500ML-% IV SOLN
INTRAVENOUS | Status: AC
  Filled 2021-11-11: qty 500

## 2021-11-11 MED ORDER — KETOROLAC TROMETHAMINE 30 MG/ML IJ SOLN: 30.0000 mg | Freq: Four times a day (QID) | INTRAMUSCULAR | Status: DC | PRN

## 2021-11-11 MED ORDER — LACTATED RINGERS IV SOLN: INTRAVENOUS | Status: DC

## 2021-11-11 MED ORDER — FENTANYL CITRATE (PF) 100 MCG/2ML IJ SOLN
INTRAMUSCULAR | Status: AC
  Filled 2021-11-11: qty 2

## 2021-11-11 MED ORDER — SENNOSIDES-DOCUSATE SODIUM 8.6-50 MG PO TABS
2.0000 | ORAL_TABLET | Freq: Every day | ORAL | Status: DC
  Administered 2021-11-12 – 2021-11-13 (×2): 2 via ORAL
  Filled 2021-11-11 (×2): qty 2

## 2021-11-11 MED ORDER — DIPHENHYDRAMINE HCL 25 MG PO CAPS: 25.0000 mg | ORAL_CAPSULE | Freq: Four times a day (QID) | ORAL | Status: DC | PRN

## 2021-11-11 MED ORDER — LABETALOL HCL 100 MG PO TABS
100.0000 mg | ORAL_TABLET | Freq: Three times a day (TID) | ORAL | Status: DC
  Administered 2021-11-11: 100 mg via ORAL
  Filled 2021-11-11: qty 1

## 2021-11-11 MED ORDER — ONDANSETRON HCL 4 MG/2ML IJ SOLN
INTRAMUSCULAR | Status: DC | PRN
Start: 2021-11-11 — End: 2021-11-11
  Administered 2021-11-11: 4 mg via INTRAVENOUS

## 2021-11-11 MED ORDER — IBUPROFEN 600 MG PO TABS
600.0000 mg | ORAL_TABLET | Freq: Four times a day (QID) | ORAL | Status: DC | PRN
  Administered 2021-11-11 – 2021-11-13 (×6): 600 mg via ORAL
  Filled 2021-11-11 (×5): qty 1

## 2021-11-11 MED ORDER — CEFAZOLIN SODIUM-DEXTROSE 2-4 GM/100ML-% IV SOLN
2.0000 g | INTRAVENOUS | Status: AC
  Administered 2021-11-11: 2 g via INTRAVENOUS

## 2021-11-11 MED ORDER — NALOXONE HCL 0.4 MG/ML IJ SOLN: 0.4000 mg | INTRAMUSCULAR | Status: DC | PRN

## 2021-11-11 MED ORDER — PHENYLEPHRINE HCL-NACL 20-0.9 MG/250ML-% IV SOLN
INTRAVENOUS | Status: AC
  Filled 2021-11-11: qty 250

## 2021-11-11 MED ORDER — TETANUS-DIPHTH-ACELL PERTUSSIS 5-2.5-18.5 LF-MCG/0.5 IM SUSY: 0.5000 mL | PREFILLED_SYRINGE | Freq: Once | INTRAMUSCULAR | Status: DC

## 2021-11-11 MED ORDER — ONDANSETRON HCL 4 MG/2ML IJ SOLN
INTRAMUSCULAR | Status: AC
  Filled 2021-11-11: qty 2

## 2021-11-11 MED ORDER — LABETALOL HCL 200 MG PO TABS
300.0000 mg | ORAL_TABLET | Freq: Three times a day (TID) | ORAL | Status: DC
  Administered 2021-11-11 – 2021-11-13 (×6): 300 mg via ORAL
  Filled 2021-11-11 (×6): qty 1

## 2021-11-11 MED ORDER — CEFAZOLIN SODIUM-DEXTROSE 2-4 GM/100ML-% IV SOLN
INTRAVENOUS | Status: AC
  Filled 2021-11-11: qty 100

## 2021-11-11 MED ORDER — PROMETHAZINE HCL 25 MG/ML IJ SOLN: 6.2500 mg | INTRAMUSCULAR | Status: DC | PRN

## 2021-11-11 MED ORDER — SODIUM CHLORIDE 0.9% FLUSH: 3.0000 mL | INTRAVENOUS | Status: DC | PRN

## 2021-11-11 MED ORDER — PHENYLEPHRINE HCL-NACL 20-0.9 MG/250ML-% IV SOLN
INTRAVENOUS | Status: DC | PRN
  Administered 2021-11-11: 60 ug/min via INTRAVENOUS

## 2021-11-11 MED ORDER — SOD CITRATE-CITRIC ACID 500-334 MG/5ML PO SOLN
ORAL | Status: AC
  Filled 2021-11-11: qty 30

## 2021-11-11 MED ORDER — BUPIVACAINE IN DEXTROSE 0.75-8.25 % IT SOLN
INTRATHECAL | Status: DC | PRN
  Administered 2021-11-11: 1.6 mL via INTRATHECAL

## 2021-11-11 MED ORDER — VITAMIN D 25 MCG (1000 UNIT) PO TABS
2000.0000 [IU] | ORAL_TABLET | Freq: Two times a day (BID) | ORAL | Status: DC
  Administered 2021-11-11 – 2021-11-13 (×5): 2000 [IU] via ORAL
  Filled 2021-11-11 (×5): qty 2

## 2021-11-11 MED ORDER — FENTANYL CITRATE (PF) 100 MCG/2ML IJ SOLN
INTRAMUSCULAR | Status: DC | PRN
  Administered 2021-11-11: 15 ug via INTRATHECAL

## 2021-11-11 MED ORDER — ACETAMINOPHEN 500 MG PO TABS: 1000.0000 mg | ORAL_TABLET | Freq: Once | ORAL | Status: DC

## 2021-11-11 MED ORDER — WITCH HAZEL-GLYCERIN EX PADS: 1.0000 "application " | MEDICATED_PAD | CUTANEOUS | Status: DC | PRN

## 2021-11-11 MED ORDER — KETOROLAC TROMETHAMINE 30 MG/ML IJ SOLN
INTRAMUSCULAR | Status: AC
  Filled 2021-11-11: qty 1

## 2021-11-11 MED ORDER — NALOXONE HCL 4 MG/10ML IJ SOLN
1.0000 ug/kg/h | INTRAVENOUS | Status: DC | PRN
  Filled 2021-11-11: qty 5

## 2021-11-11 MED ORDER — SIMETHICONE 80 MG PO CHEW
80.0000 mg | CHEWABLE_TABLET | Freq: Three times a day (TID) | ORAL | Status: DC
  Administered 2021-11-11 – 2021-11-12 (×4): 80 mg via ORAL
  Filled 2021-11-11 (×6): qty 1

## 2021-11-11 MED ORDER — ACETAMINOPHEN 500 MG PO TABS
1000.0000 mg | ORAL_TABLET | Freq: Four times a day (QID) | ORAL | Status: AC
  Administered 2021-11-11 – 2021-11-12 (×3): 1000 mg via ORAL
  Filled 2021-11-11 (×3): qty 2

## 2021-11-11 MED ORDER — SIMETHICONE 80 MG PO CHEW: 80.0000 mg | CHEWABLE_TABLET | ORAL | Status: DC | PRN

## 2021-11-11 MED ORDER — FENTANYL CITRATE (PF) 100 MCG/2ML IJ SOLN: 25.0000 ug | INTRAMUSCULAR | Status: DC | PRN

## 2021-11-11 MED ORDER — DEXAMETHASONE SODIUM PHOSPHATE 10 MG/ML IJ SOLN
INTRAMUSCULAR | Status: DC | PRN
  Administered 2021-11-11: 10 mg via INTRAVENOUS

## 2021-11-11 MED ORDER — MENTHOL 3 MG MT LOZG: 1.0000 | LOZENGE | OROMUCOSAL | Status: DC | PRN

## 2021-11-11 MED ORDER — OXYTOCIN-SODIUM CHLORIDE 30-0.9 UT/500ML-% IV SOLN: 2.5000 [IU]/h | INTRAVENOUS | Status: AC

## 2021-11-11 MED ORDER — DIBUCAINE (PERIANAL) 1 % EX OINT: 1.0000 "application " | TOPICAL_OINTMENT | CUTANEOUS | Status: DC | PRN

## 2021-11-11 MED ORDER — COCONUT OIL OIL: 1.0000 "application " | TOPICAL_OIL | Status: DC | PRN

## 2021-11-11 MED ORDER — DIPHENHYDRAMINE HCL 50 MG/ML IJ SOLN: 12.5000 mg | INTRAMUSCULAR | Status: DC | PRN

## 2021-11-11 MED ORDER — ACETAMINOPHEN 160 MG/5ML PO SOLN: 1000.0000 mg | Freq: Once | ORAL | Status: DC

## 2021-11-11 MED ORDER — ONDANSETRON HCL 4 MG/2ML IJ SOLN: 4.0000 mg | Freq: Three times a day (TID) | INTRAMUSCULAR | Status: DC | PRN

## 2021-11-11 MED ORDER — SOD CITRATE-CITRIC ACID 500-334 MG/5ML PO SOLN
30.0000 mL | ORAL | Status: AC
  Administered 2021-11-11: 30 mL via ORAL

## 2021-11-11 MED ORDER — OXYCODONE HCL 5 MG PO TABS
5.0000 mg | ORAL_TABLET | ORAL | Status: DC | PRN
  Administered 2021-11-12 – 2021-11-13 (×2): 5 mg via ORAL
  Administered 2021-11-13: 10 mg via ORAL
  Filled 2021-11-11: qty 1
  Filled 2021-11-11 (×2): qty 2

## 2021-11-11 MED ORDER — ACETAMINOPHEN 500 MG PO TABS
1000.0000 mg | ORAL_TABLET | Freq: Four times a day (QID) | ORAL | Status: DC
  Administered 2021-11-12 – 2021-11-13 (×4): 1000 mg via ORAL
  Filled 2021-11-11 (×5): qty 2

## 2021-11-11 MED ORDER — KETOROLAC TROMETHAMINE 30 MG/ML IJ SOLN
30.0000 mg | Freq: Once | INTRAMUSCULAR | Status: AC
  Administered 2021-11-11: 30 mg via INTRAVENOUS

## 2021-11-11 MED ORDER — OXYTOCIN-SODIUM CHLORIDE 30-0.9 UT/500ML-% IV SOLN
INTRAVENOUS | Status: DC | PRN
  Administered 2021-11-11: 30 [IU] via INTRAVENOUS

## 2021-11-11 MED ORDER — MORPHINE SULFATE (PF) 0.5 MG/ML IJ SOLN
INTRAMUSCULAR | Status: DC | PRN
  Administered 2021-11-11: 150 ug via INTRATHECAL

## 2021-11-11 MED ORDER — LABETALOL HCL 200 MG PO TABS
200.0000 mg | ORAL_TABLET | Freq: Once | ORAL | Status: AC
  Administered 2021-11-11: 200 mg via ORAL
  Filled 2021-11-11: qty 1

## 2021-11-11 MED ORDER — PRENATAL MULTIVITAMIN CH
1.0000 | ORAL_TABLET | Freq: Every day | ORAL | Status: DC
  Administered 2021-11-12: 1 via ORAL
  Filled 2021-11-11: qty 1

## 2021-11-11 MED ORDER — ZOLPIDEM TARTRATE 5 MG PO TABS: 5.0000 mg | ORAL_TABLET | Freq: Every evening | ORAL | Status: DC | PRN

## 2021-11-11 MED ORDER — DEXAMETHASONE SODIUM PHOSPHATE 10 MG/ML IJ SOLN
INTRAMUSCULAR | Status: AC
  Filled 2021-11-11: qty 1

## 2021-11-11 MED ORDER — MORPHINE SULFATE (PF) 0.5 MG/ML IJ SOLN
INTRAMUSCULAR | Status: AC
  Filled 2021-11-11: qty 10

## 2021-11-11 MED ORDER — VITAMIN D3 50 MCG (2000 UT) PO CAPS
2000.0000 [IU] | ORAL_CAPSULE | Freq: Two times a day (BID) | ORAL | Status: DC
  Filled 2021-11-11: qty 1

## 2021-11-11 MED ORDER — PHENYLEPHRINE 40 MCG/ML (10ML) SYRINGE FOR IV PUSH (FOR BLOOD PRESSURE SUPPORT)
PREFILLED_SYRINGE | INTRAVENOUS | Status: DC | PRN
  Administered 2021-11-11: 40 ug via INTRAVENOUS

## 2021-11-11 MED ORDER — DIPHENHYDRAMINE HCL 25 MG PO CAPS: 25.0000 mg | ORAL_CAPSULE | ORAL | Status: DC | PRN

## 2021-11-11 MED ORDER — SODIUM CHLORIDE 0.9 % IR SOLN
Status: DC | PRN
  Administered 2021-11-11: 1

## 2021-11-11 MED ORDER — POVIDONE-IODINE 10 % EX SWAB
2.0000 "application " | Freq: Once | CUTANEOUS | Status: AC
  Administered 2021-11-11: 2 via TOPICAL

## 2021-11-11 MED ORDER — STERILE WATER FOR IRRIGATION IR SOLN
Status: DC | PRN
  Administered 2021-11-11: 1000 mL

## 2021-11-11 MED ORDER — HYDROMORPHONE HCL 1 MG/ML IJ SOLN: 0.2000 mg | INTRAMUSCULAR | Status: DC | PRN

## 2021-11-11 SURGICAL SUPPLY — 36 items
BENZOIN TINCTURE PRP APPL 2/3 (GAUZE/BANDAGES/DRESSINGS) ×1 IMPLANT
CHLORAPREP W/TINT 26ML (MISCELLANEOUS) ×2 IMPLANT
CLAMP CORD UMBIL (MISCELLANEOUS) IMPLANT
CLOSURE STERI STRIP 1/2 X4 (GAUZE/BANDAGES/DRESSINGS) ×1 IMPLANT
CLOTH BEACON ORANGE TIMEOUT ST (SAFETY) ×2 IMPLANT
DERMABOND ADVANCED (GAUZE/BANDAGES/DRESSINGS)
DERMABOND ADVANCED .7 DNX12 (GAUZE/BANDAGES/DRESSINGS) IMPLANT
DRSG OPSITE POSTOP 4X10 (GAUZE/BANDAGES/DRESSINGS) ×2 IMPLANT
DRSG PAD ABDOMINAL 8X10 ST (GAUZE/BANDAGES/DRESSINGS) ×1 IMPLANT
ELECT REM PT RETURN 9FT ADLT (ELECTROSURGICAL) ×2
ELECTRODE REM PT RTRN 9FT ADLT (ELECTROSURGICAL) ×1 IMPLANT
EXTRACTOR VACUUM M CUP 4 TUBE (SUCTIONS) IMPLANT
GAUZE SPONGE 4X4 12PLY STRL LF (GAUZE/BANDAGES/DRESSINGS) ×2 IMPLANT
GLOVE BIO SURGEON STRL SZ7.5 (GLOVE) ×2 IMPLANT
GLOVE BIOGEL PI IND STRL 7.0 (GLOVE) ×1 IMPLANT
GLOVE BIOGEL PI INDICATOR 7.0 (GLOVE) ×1
GOWN STRL REUS W/TWL LRG LVL3 (GOWN DISPOSABLE) ×4 IMPLANT
KIT ABG SYR 3ML LUER SLIP (SYRINGE) ×2 IMPLANT
NDL HYPO 25X5/8 SAFETYGLIDE (NEEDLE) ×1 IMPLANT
NEEDLE HYPO 25X5/8 SAFETYGLIDE (NEEDLE) ×2 IMPLANT
NS IRRIG 1000ML POUR BTL (IV SOLUTION) ×2 IMPLANT
PACK C SECTION WH (CUSTOM PROCEDURE TRAY) ×2 IMPLANT
PAD OB MATERNITY 4.3X12.25 (PERSONAL CARE ITEMS) ×2 IMPLANT
PENCIL SMOKE EVAC W/HOLSTER (ELECTROSURGICAL) ×2 IMPLANT
STRIP CLOSURE SKIN 1/2X4 (GAUZE/BANDAGES/DRESSINGS) IMPLANT
SUT MNCRL 0 VIOLET CTX 36 (SUTURE) ×4 IMPLANT
SUT MONOCRYL 0 CTX 36 (SUTURE) ×8
SUT PDS AB 0 CTX 60 (SUTURE) ×2 IMPLANT
SUT PLAIN 0 NONE (SUTURE) IMPLANT
SUT PLAIN 2 0 (SUTURE) ×2
SUT PLAIN 2 0 XLH (SUTURE) IMPLANT
SUT PLAIN ABS 2-0 CT1 27XMFL (SUTURE) IMPLANT
SUT VIC AB 4-0 KS 27 (SUTURE) ×2 IMPLANT
TOWEL OR 17X24 6PK STRL BLUE (TOWEL DISPOSABLE) ×2 IMPLANT
TRAY FOLEY W/BAG SLVR 14FR LF (SET/KITS/TRAYS/PACK) ×2 IMPLANT
WATER STERILE IRR 1000ML POUR (IV SOLUTION) ×2 IMPLANT

## 2021-11-11 NOTE — Brief Op Note (Signed)
11/11/2021  9:29 AM  PATIENT:  Alisha Copeland  36 y.o. female  PRE-OPERATIVE DIAGNOSIS:  PREVIOUS X 1  POST-OPERATIVE DIAGNOSIS:  PREVIOUS X 1  PROCEDURE:  Procedure(s): REPEAT CESAREAN SECTION EDC: 11-25-21 ALLERG: VANCOMYCIN, MESTINON, SULFA, CIPRO, MACROBID  PREVIOUS X 1 (N/A)  SURGEON:  Surgeon(s) and Role:    * Everlene Farrier, MD - Primary  PHYSICIAN ASSISTANT:   ASSISTANTS: none   ANESTHESIA:   spinal  EBL:  157   BLOOD ADMINISTERED:none  DRAINS: Urinary Catheter (Foley)   LOCAL MEDICATIONS USED:  NONE  SPECIMEN:  Source of Specimen:  placenta  DISPOSITION OF SPECIMEN:  PATHOLOGY  COUNTS:  YES  TOURNIQUET:  * No tourniquets in log *  DICTATION: .Other Dictation: Dictation Number [id number]  PLAN OF CARE: Admit to inpatient   PATIENT DISPOSITION:  PACU - hemodynamically stable.   Delay start of Pharmacological VTE agent (>24hrs) due to surgical blood loss or risk of bleeding: not applicable

## 2021-11-11 NOTE — Progress Notes (Signed)
Pt with HTN and current elevated Bps. Ordered for 100mg  Labetalol tid, patient says she takes 300mg s tid.  Called Dr. Gaetano Net for BP notification and new orders for verbal medication modification.

## 2021-11-11 NOTE — Op Note (Signed)
NAME: Alisha Copeland, KLEMAN MEDICAL RECORD NO: [id number] ACCOUNT NO: [id number] DATE OF BIRTH: August 25, 1986 FACILITY: [facility] LOCATION: [facility] PHYSICIAN: Daleen Bo. Lyn Hollingshead, MD  Operative Report   DATE OF PROCEDURE: 11/11/2021  PREOPERATIVE DIAGNOSES:   1.  Previous cesarean section, desires repeat. 2.  Chronic hypertension.  POSTOPERATIVE DIAGNOSES: 1.  Previous cesarean section, desires repeat. 2.  Chronic hypertension.  PROCEDURE:  Repeat low transverse cesarean section.  SURGEON:  Daleen Bo. Lyn Hollingshead, MD  ANESTHESIA:  Spinal, Dr. Edwinna Areola.  SPECIMENS:  Placenta to pathology.  FINDINGS:  Viable female infant, Apgars, arterial cord pH, birth weight pending.  INDICATIONS AND CONSENT:  This patient is a 36 year old G3, P1 at 38-0/7th weeks.  Prenatal care has been complicated by hypertension, on labetalol 300 mg t.i.d.  Blood pressures have recently become elevated with diastolics of 466 on repetitive checks.   She is without headache or vision change and protein remains negative in the urine.  Recommendation for delivery is made.  Repeat cesarean section was discussed and potential risks and complications are discussed preoperatively including but not limited  to infection, organ damage, bleeding requiring transfusion of blood products with HIV and hepatitis acquisition, DVT, PE, pneumonia and wound breakdown.  She states she understands and agrees and consent is signed on the chart.  DESCRIPTION OF PROCEDURE:  The patient was taken to the operating room where she was identified.  Spinal anesthetic was placed per anesthesiology and she was placed in the dorsal supine position with a 15-degree left lateral wedge.  She was prepped  vaginally with Betadine.  Foley catheter was placed and prepped abdominally with ChloraPrep.  Timeout undertaken.  After 3-minute drying time, she was draped in a sterile fashion.  After testing for adequate spinal anesthesia skin was entered through a  Pfannenstiel  incision, taking out the old scar on the way in.  Dissection was carried out in layers.  There were some adhesions in the subcutaneous layer.  Peritoneum was successfully entered and extended superiorly and inferiorly.  Vesicouterine  peritoneum was taken down cephalolaterally.  Bladder flap developed and the bladder blade was placed.  Uterus was incised in a low transverse manner and the uterine cavity was entered bluntly with a hemostat.  The uterine incision was extended with  fingers.  Clear fluid is noted.  Baby was delivered from the vertex position with a vacuum extractor to elevate the vertex through the incision.  Good cry and tone is noted.  After 1 minute time, the cord was clamped and cut and the baby was handed to  the waiting pediatrics team.  Placenta was delivered with uterine massage.  The cavity is inspected and clean.  Uterus was closed in 2 running locking sutures of 0 Monocryl.  This achieved good hemostasis.  Tubes and ovaries are normal bilaterally.   Lavage is carried out and returned as clear.  Anterior peritoneum was closed in running fashion with 0 Monocryl suture, which was also used to reapproximate the pyramidalis muscle in the midline.  Anterior rectus fascia was closed in a running fashion  with a 0 looped PDS.  Subcutaneous layer was closed with interrupted plain and the skin was closed in a subcuticular fashion with 4-0 Vicryl on a Keith needle.  Benzoin, Steri-Strips, honeycomb and pressure dressing were applied.  The patient is taken to  recovery room in stable condition.   PUS D: 11/11/2021 9:35:56 am T: 11/11/2021 1:26:00 pm  JOB: [id number]/ [id number]

## 2021-11-11 NOTE — Transfer of Care (Signed)
Immediate Anesthesia Transfer of Care Note  Patient: [name]  Procedure(s) Performed: REPEAT CESAREAN SECTION EDC: 11-25-21 ALLERG: VANCOMYCIN, MESTINON, SULFA, CIPRO, MACROBID  PREVIOUS X 1  Patient Location: PACU  Anesthesia Type:Spinal  Level of Consciousness: awake, alert  and oriented  Airway & Oxygen Therapy: Patient Spontanous Breathing  Post-op Assessment: Report given to RN and Post -op Vital signs reviewed and stable  Post vital signs: Reviewed and stable  Last Vitals:  Vitals Value Taken Time  BP 135/91 11/11/21 0945  Temp    Pulse 78 11/11/21 0947  Resp 16 11/11/21 0947  SpO2 98 % 11/11/21 0947  Vitals shown include unvalidated device data.  Last Pain:  Vitals:   11/11/21 0941  TempSrc: (P) Oral         Complications: No notable events documented.

## 2021-11-11 NOTE — Progress Notes (Signed)
No change to H&P per patient history No HA, no vision change, no abdominal pain  Today's Vitals   11/11/21 0702 11/11/21 0720  BP: (!) 152/100 (!) 131/100  Pulse: 84   Resp: 16   Temp: 98 F (36.7 C)   TempSrc: Oral   SpO2: 95%   Weight: 84.5 kg   Height: 5\' 7"  (1.702 m)    Body mass index is 29.18 kg/m.  Reviewed with patient cesarean section and risks including infection, organ damage, bleeding/transfusion-HIV/Hep, DVT/PE, pneumonia, wound breakdown. All questions answered. She states she understands and agrees.

## 2021-11-11 NOTE — Lactation Note (Signed)
This note was copied from a baby's chart. Lactation Consultation Note  Patient Name: Alisha Copeland [id number] Date: 11/11/2021 Reason for consult: Initial assessment Age:36 hours  LC contacted Eartha Inch RN to inquire about LC order. LC was informed mother declines LC services at this time. Mother may call for assistance as needed.  Feeding Mother's Current Feeding Choice: Breast Milk  Consult Status Consult Status: Complete (mother declined follow up) Date: 11/11/21 Follow-up type: Call as needed    [place] 11/11/2021, 7:45 PM

## 2021-11-11 NOTE — Anesthesia Procedure Notes (Signed)
Spinal  Patient location during procedure: OR Start time: 11/11/2021 8:25 AM End time: 11/11/2021 8:28 AM Reason for block: surgical anesthesia Staffing Performed: anesthesiologist  Anesthesiologist: Brennan Bailey, MD Preanesthetic Checklist Completed: patient identified, IV checked, risks and benefits discussed, monitors and equipment checked, pre-op evaluation and timeout performed Spinal Block Patient position: sitting Prep: DuraPrep and site prepped and draped Patient monitoring: heart rate, continuous pulse ox and blood pressure Approach: midline Location: L3-4 Injection technique: single-shot Needle Needle type: Pencan  Needle gauge: 24 G Needle length: 10 cm Assessment Sensory level: T4 Events: CSF return Additional Notes Risks, benefits, and alternative discussed. Patient gave consent to procedure. Prepped and draped in sitting position. Clear CSF obtained after one needle pass. Positive terminal aspiration. No pain or paraesthesias with injection. Patient tolerated procedure well. Vital signs stable. Tawny Asal, MD

## 2021-11-11 NOTE — Anesthesia Postprocedure Evaluation (Signed)
Anesthesia Post Note  Patient: Alisha Copeland  Procedure(s) Performed: REPEAT CESAREAN SECTION EDC: 11-25-21 ALLERG: VANCOMYCIN, MESTINON, SULFA, CIPRO, MACROBID  PREVIOUS X 1     Patient location during evaluation: PACU Anesthesia Type: Spinal Level of consciousness: awake and alert Pain management: pain level controlled Vital Signs Assessment: post-procedure vital signs reviewed and stable Respiratory status: spontaneous breathing, nonlabored ventilation and respiratory function stable Cardiovascular status: blood pressure returned to baseline Postop Assessment: no apparent nausea or vomiting, spinal receding, no headache and no backache Anesthetic complications: no   No notable events documented.  Last Vitals:  Vitals:   11/11/21 1030 11/11/21 1048  BP: 139/82 (!) 141/101  Pulse: 73 69  Resp: 13 16  Temp: 36.5 C 36.5 C  SpO2: 99% 98%    Last Pain:  Vitals:   11/11/21 1048  TempSrc: Oral  PainSc: 0-No pain   Pain Goal:    LLE Motor Response: Purposeful movement (11/11/21 1030)   RLE Motor Response: Purposeful movement (11/11/21 1030)       Epidural/Spinal Function Cutaneous sensation: Able to Discern Pressure (11/11/21 1030), Patient able to flex knees: Yes (11/11/21 1030), Patient able to lift hips off bed: No (11/11/21 1030), Back pain beyond tenderness at insertion site: No (11/11/21 1030), Progressively worsening motor and/or sensory loss: No (11/11/21 1030), Bowel and/or bladder incontinence post epidural: No (11/11/21 1030)  Marthenia Rolling

## 2021-11-12 LAB — CBC
HCT: 31.5 % — ABNORMAL LOW (ref 36.0–46.0)
Hemoglobin: 10.3 g/dL — ABNORMAL LOW (ref 12.0–15.0)
MCH: 29 pg (ref 26.0–34.0)
MCHC: 32.7 g/dL (ref 30.0–36.0)
MCV: 88.7 fL (ref 80.0–100.0)
Platelets: 189 10*3/uL (ref 150–400)
RBC: 3.55 MIL/uL — ABNORMAL LOW (ref 3.87–5.11)
RDW: 14.3 % (ref 11.5–15.5)
WBC: 14.6 10*3/uL — ABNORMAL HIGH (ref 4.0–10.5)
nRBC: 0 % (ref 0.0–0.2)

## 2021-11-12 NOTE — Progress Notes (Signed)
Subjective: Postpartum Day 1: Cesarean Delivery Patient reports tolerating PO, + flatus, and no problems voiding.    Objective: Vital signs in last 24 hours: Temp:  [97.4 F (36.3 C)-97.7 F (36.5 C)] 97.5 F (36.4 C) (01/28 0554) Pulse Rate:  [65-88] 88 (01/28 0554) Resp:  [13-22] 19 (01/28 0554) BP: (112-154)/(79-101) 112/79 (01/28 0554) SpO2:  [97 %-99 %] 98 % (01/28 0554)  Physical Exam:  General: alert, cooperative, and no distress Lochia: appropriate Uterine Fundus: firm Incision: healing well DVT Evaluation: No evidence of DVT seen on physical exam.  Recent Labs    11/11/21 0652 11/12/21 0435  HGB 11.7* 10.3*  HCT 34.2* 31.5*    Assessment/Plan: Status post Cesarean section. Doing well postoperatively.  Continue current care. Baby is no circumcision-will FU with pediatric urology Shon Millet II 11/12/2021, 8:11 AM

## 2021-11-13 LAB — RPR: RPR Ser Ql: NONREACTIVE

## 2021-11-13 MED ORDER — LABETALOL HCL 100 MG PO TABS: 300.0000 mg | ORAL_TABLET | Freq: Three times a day (TID) | ORAL | 1 refills | Status: AC

## 2021-11-13 MED ORDER — IBUPROFEN 600 MG PO TABS: 600.0000 mg | ORAL_TABLET | Freq: Four times a day (QID) | ORAL | 0 refills | Status: DC | PRN

## 2021-11-13 MED ORDER — ACETAMINOPHEN 500 MG PO TABS: 1000.0000 mg | ORAL_TABLET | Freq: Three times a day (TID) | ORAL | 0 refills | Status: DC | PRN

## 2021-11-13 MED ORDER — OXYCODONE HCL 5 MG PO TABS: 5.0000 mg | ORAL_TABLET | Freq: Four times a day (QID) | ORAL | 0 refills | Status: DC | PRN

## 2021-11-13 NOTE — Discharge Summary (Signed)
Postpartum Discharge Summary  Date of Service updated 11/13/21     Patient Name: Alisha Copeland DOB: [birth year] MRN: [id number]  Date of admission: 11/11/2021 Delivery date:11/11/2021  Delivering provider: Everlene Farrier  Date of discharge: 11/13/2021  Admitting diagnosis: Chronic hypertension affecting pregnancy [O10.919] Cesarean delivery delivered [O82] Intrauterine pregnancy: [redacted]w[redacted]d    Secondary diagnosis:  Principal Problem:   Chronic hypertension affecting pregnancy Active Problems:   Cesarean delivery delivered  Additional problems:     Discharge diagnosis: Gestational Hypertension                                              Post partum procedures: Augmentation:  Complications: None  Hospital course: Sceduled C/S   36y.o. yo [id number] admitted to the hospital 11/11/2021 for scheduled cesarean section with the following indication:Elective Repeat.Delivery details are as follows:  Membrane Rupture Time/Date: 8:51 AM ,11/11/2021   Delivery Method:C-Section, Vacuum Assisted  Details of operation can be found in separate operative note.  Patient had an uncomplicated postpartum course.  She is ambulating, tolerating a regular diet, passing flatus, and urinating well. Patient is discharged home in stable condition on  11/13/21        Newborn Data: Birth date:11/11/2021  Birth time:8:54 AM  Gender:Female  Living status:Living  Apgars:9 ,9  Weight:3670 g     Magnesium Sulfate received: No BMZ received: No Rhophylac:Yes MMR:No T-DaP:Given prenatally Flu: No Transfusion:No  Physical exam  Vitals:   11/12/21 1732 11/12/21 2240 11/13/21 0030 11/13/21 0520  BP: 133/88 (!) 151/100 (!) 142/88 (!) 143/96  Pulse: 82 88 84 77  Resp:  18  18  Temp:  97.6 F (36.4 C)  (!) 97.4 F (36.3 C)  TempSrc:  Oral  Oral  SpO2:  100% 99% 100%  Weight:      Height:       General: alert, cooperative, and no distress Lochia: appropriate Uterine Fundus: firm Incision:  Healing well with no significant drainage DVT Evaluation: No evidence of DVT seen on physical exam. Labs: Lab Results  Component Value Date   WBC 14.6 (H) 11/12/2021   HGB 10.3 (L) 11/12/2021   HCT 31.5 (L) 11/12/2021   MCV 88.7 11/12/2021   PLT 189 11/12/2021   CMP Latest Ref Rng & Units 11/11/2021  Glucose 70 - 99 mg/dL 87  BUN 6 - 20 mg/dL 19  Creatinine 0.44 - 1.00 mg/dL 0.90  Sodium 135 - 145 mmol/L 136  Potassium 3.5 - 5.1 mmol/L 3.6  Chloride 98 - 111 mmol/L 106  CO2 22 - 32 mmol/L 21(L)  Calcium 8.9 - 10.3 mg/dL 8.2(L)  Total Protein 6.5 - 8.1 g/dL 6.2(L)  Total Bilirubin 0.3 - 1.2 mg/dL 0.4  Alkaline Phos 38 - 126 U/L 141(H)  AST 15 - 41 U/L 22  ALT 0 - 44 U/L 18   Edinburgh Score: Edinburgh Postnatal Depression Scale Screening Tool 11/11/2021  I have been able to laugh and see the funny side of things. 0  I have looked forward with enjoyment to things. 0  I have blamed myself unnecessarily when things went wrong. 1  I have been anxious or worried for no good reason. 0  I have felt scared or panicky for no good reason. 0  Things have been getting on top of me. 1  I have been so  unhappy that I have had difficulty sleeping. 1  I have felt sad or miserable. 0  I have been so unhappy that I have been crying. 1  The thought of harming myself has occurred to me. 0  Edinburgh Postnatal Depression Scale Total 4      After visit meds:  Allergies as of 11/13/2021       Reactions   Cisapride Other (See Comments)   Prolonged QT syndrome   Methylphenidate Hcl Other (See Comments)   Increased tachycardia   Propranolol Other (See Comments)   Lethargy and fatigue   Sertraline Hcl Diarrhea   Amphetamine Palpitations   Dextroamphetamine Palpitations   Mononessa [norgestimate-eth Estradiol] Rash   Sulfa Antibiotics Rash   Other reaction(s): GI Intolerance   Vancomycin Hives, Rash        Medication List     STOP taking these medications    famotidine 20 MG  tablet Commonly known as: PEPCID       TAKE these medications    acetaminophen 500 MG tablet Commonly known as: TYLENOL Take 2 tablets (1,000 mg total) by mouth every 8 (eight) hours as needed.   calcitRIOL 0.5 MCG capsule Commonly known as: ROCALTROL Take 6 capsules (3 mcg total) by mouth daily.   ibuprofen 600 MG tablet Commonly known as: ADVIL Take 1 tablet (600 mg total) by mouth every 6 (six) hours as needed for moderate pain.   labetalol 100 MG tablet Commonly known as: NORMODYNE Take 3 tablets (300 mg total) by mouth 3 (three) times daily. What changed:  medication strength how much to take   oxyCODONE 5 MG immediate release tablet Commonly known as: Oxy IR/ROXICODONE Take 1 tablet (5 mg total) by mouth every 6 (six) hours as needed for moderate pain.   PRENATAL ADULT GUMMY/DHA/FA PO Take 2 tablets by mouth every evening.   Vitamin D3 50 MCG (2000 UT) capsule Take 1 capsule (2,000 Units total) by mouth daily. What changed: when to take this         Discharge home in stable condition Infant Feeding: Breast Infant Disposition:home with mother Discharge instruction: per After Visit Summary and Postpartum booklet. Activity: Advance as tolerated. Pelvic rest for 6 weeks.  Diet: routine diet Anticipated Birth Control: Unsure Postpartum Appointment:6 weeks Additional Postpartum F/U: BP check 2-3 days Future Appointments: Future Appointments  Date Time Provider [place]  02/03/2022  2:15 PM Renato Shin, MD [id number] None   Follow up Visit:      11/13/2021 Allena Katz, MD

## 2021-11-14 LAB — SURGICAL PATHOLOGY

## 2021-11-16 ENCOUNTER — Other Ambulatory Visit (HOSPITAL_COMMUNITY)
Admission: RE | Admit: 2021-11-16 | Discharge: 2021-11-16 | Disposition: A | Discharge status: Home / Self Care | Payer: Managed Care, Other (non HMO) | Source: Ambulatory Visit | Attending: Obstetrics and Gynecology | Admitting: Obstetrics and Gynecology

## 2021-11-17 ENCOUNTER — Telehealth: Payer: Self-pay

## 2021-11-17 ENCOUNTER — Other Ambulatory Visit: Payer: Self-pay | Admitting: Endocrinology

## 2021-11-17 DIAGNOSIS — E209 Hypoparathyroidism, unspecified: Secondary | ICD-10-CM

## 2021-11-17 MED ORDER — CALCITRIOL 0.5 MCG PO CAPS: 2.0000 ug | ORAL_CAPSULE | Freq: Every day | ORAL | 3 refills | Status: DC

## 2021-11-17 NOTE — Telephone Encounter (Signed)
Message sent to pt thru MyChart for reducing pills for calcium back to 4 pills per day.

## 2021-11-17 NOTE — Telephone Encounter (Signed)
Spoke with pt and she stated that she went to [facility] yesterday and she seen that her calcium was 9.9 and she wanted to know if you still wanted her to continue wth 6 pills per day or do you want to reduce back to 4 per day.  Please Advise

## 2021-11-18 DIAGNOSIS — Z01818 Encounter for other preprocedural examination: Secondary | ICD-10-CM

## 2021-11-23 ENCOUNTER — Telehealth (HOSPITAL_COMMUNITY): Payer: Self-pay | Admitting: *Deleted

## 2021-11-23 NOTE — Telephone Encounter (Signed)
Phone voicemail message left to return nurse call.  Odis Hollingshead, RN 11-23-2021 at 1:57pm

## 2022-02-03 ENCOUNTER — Ambulatory Visit (INDEPENDENT_AMBULATORY_CARE_PROVIDER_SITE_OTHER): Payer: Managed Care, Other (non HMO) | Admitting: Endocrinology

## 2022-02-03 VITALS — BP 124/76 | HR 96 | Ht 67.0 in | Wt 167.0 lb

## 2022-02-03 DIAGNOSIS — E559 Vitamin D deficiency, unspecified: Secondary | ICD-10-CM

## 2022-02-03 DIAGNOSIS — E778 Other disorders of glycoprotein metabolism: Secondary | ICD-10-CM

## 2022-02-03 DIAGNOSIS — E209 Hypoparathyroidism, unspecified: Secondary | ICD-10-CM

## 2022-02-03 LAB — ALBUMIN: Albumin: 4.5 g/dL (ref 3.5–5.2)

## 2022-02-03 LAB — BASIC METABOLIC PANEL
BUN: 20 mg/dL (ref 6–23)
CO2: 27 mEq/L (ref 19–32)
Calcium: 9.6 mg/dL (ref 8.4–10.5)
Chloride: 101 mEq/L (ref 96–112)
Creatinine, Ser: 0.78 mg/dL (ref 0.40–1.20)
GFR: 98.21 mL/min (ref >60.00)
Glucose, Bld: 109 mg/dL — ABNORMAL HIGH (ref 70–99)
Potassium: 3.7 mEq/L (ref 3.5–5.1)
Sodium: 139 mEq/L (ref 135–145)

## 2022-02-03 LAB — VITAMIN D 25 HYDROXY (VIT D DEFICIENCY, FRACTURES): VITD: 25.5 ng/mL — ABNORMAL LOW (ref 30.00–100.00)

## 2022-02-03 MED ORDER — CALCITRIOL 0.5 MCG PO CAPS: 1.5000 ug | ORAL_CAPSULE | Freq: Every day | ORAL | 1 refills | Status: DC

## 2022-02-03 MED ORDER — CHOLECALCIFEROL 125 MCG (5000 UT) PO TABS: 1.0000 | ORAL_TABLET | Freq: Every day | ORAL | 3 refills | Status: AC

## 2022-02-03 NOTE — Progress Notes (Signed)
? ?Subjective:  ? ? Patient ID: Alisha Copeland, female    DOB: 06-Jun-1986, 36 y.o.   MRN: [id number] ? ?HPI ?Pt returns for f/u of primary hypoparathyroidism (dx'ed at age 55; she has been on rocaltrol since then; at age 46, pt says she was hospitalized in ICU for ARF, due to overtreatment/hypercalcemia; she has no h/o adrenal dz, infertility, vitiligo, urolithiasis, ceilac disease, yeast infections, pernicious anemia, or liver disease; pt says she had genetic testing many years ago, but no hereditary syndrome was found; she declined Natpara when it was available; she takes PNV).  She takes Vit-D 4000 units/d, and rocaltrol, 4x0.5 mcg/d.  pt states she feels well in general.  Pt says another pregnancy is possible.   ?Past Medical History:  ?Diagnosis Date  ? Allergic rhinitis   ? Anemia   ? Celiac artery stenosis (HCC)   ? 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 ? congenital. No sign of vasculitis or RAS.  ? GERD (gastroesophageal reflux disease)   ? History of iron deficiency anemia   ? +hx of iron infusion, hx of transfusion 2020 pregnancy  ? Hypertension   ? multiple w/u's for secondary HTN unrevealing/NEG  ? Idiopathic hypoparathyroidism ([place])   ? formerly followed by peds endo at [facility]. Dr. Loanne Drilling as adult.  ? Migraine syndrome   ? infrequent.  Remote hx of effexor for proph, +imitrex abortive med.  HAs infrequent.  ? Mild intermittent asthma   ? POTS (postural orthostatic tachycardia syndrome)   ? ? ?Past Surgical History:  ?Procedure Laterality Date  ? CESAREAN SECTION N/A 07/25/2019  ? Procedure: CESAREAN SECTION;  Surgeon: Tyson Dense, MD;  Location: [facility] LD ORS;  Service: Obstetrics;  Laterality: N/A;  ? CESAREAN SECTION N/A 11/11/2021  ? Procedure: REPEAT CESAREAN SECTION EDC: 11-25-21 ALLERG: VANCOMYCIN, MESTINON, SULFA, CIPRO, MACROBID  PREVIOUS X 1;  Surgeon: Everlene Farrier, MD;  Location: [facility] LD ORS;  Service: Obstetrics;  Laterality: N/A;  ? TRANSTHORACIC  ECHOCARDIOGRAM  07/24/2017  ? NORMAL  ? US KIDNEY BILATERAL ([place] HX)  01/26/2017  ? NO MASS OR HYDRO. LEFT KIDNEY 11.5CM. NO MASS OR HYDRO  ? ? ?Social History  ? ?Socioeconomic History  ? Marital status: Married  ?  Spouse name: Not on file  ? Number of children: Not on file  ? Years of education: Not on file  ? Highest education level: Not on file  ?Occupational History  ? Not on file  ?Tobacco Use  ? Smoking status: Never  ? Smokeless tobacco: Never  ?Vaping Use  ? Vaping Use: Never used  ?Substance and Sexual Activity  ? Alcohol use: Yes  ?  Comment: occassionaly  ? Drug use: No  ? Sexual activity: Yes  ?Other Topics Concern  ? Not on file  ?Social History Narrative  ? Married, 1 son as of 08/2020.  ? [place] native.  ? Educ: BA ([employer], [employer], [employer], [employer]).  ? Occup: pre-K teacher  ? No tob.  ? Rare ETOH.  ? ?Social Determinants of Health  ? ?Financial Resource Strain: Not on file  ?Food Insecurity: Not on file  ?Transportation Needs: Not on file  ?Physical Activity: Not on file  ?Stress: Not on file  ?Social Connections: Not on file  ?Intimate Partner Violence: Not on file  ? ? ?Current Outpatient Medications on File Prior to Visit  ?Medication Sig Dispense Refill  ? calcitRIOL (ROCALTROL) 0.5 MCG capsule Take 4 capsules (2 mcg  total) by mouth daily. 350 capsule 3  ? Cholecalciferol (VITAMIN D3) 50 MCG (2000 UT) capsule Take 1 capsule (2,000 Units total) by mouth daily. (Patient taking differently: Take 2,000 Units by mouth 2 (two) times daily.) 100 capsule 3  ? labetalol (NORMODYNE) 100 MG tablet Take 3 tablets (300 mg total) by mouth 3 (three) times daily. 90 tablet 1  ? Prenatal MV & Min w/FA-DHA (PRENATAL ADULT GUMMY/DHA/FA PO) Take 2 tablets by mouth every evening.    ? ?No current facility-administered medications on file prior to visit.  ? ? ?Allergies  ?Allergen Reactions  ? Cisapride Other (See Comments)  ?  Prolonged QT syndrome  ? Methylphenidate Hcl Other (See Comments)  ?  Increased  tachycardia  ? Propranolol Other (See Comments)  ?  Lethargy and fatigue  ? Sertraline Hcl Diarrhea  ? Amphetamine Palpitations  ? Dextroamphetamine Palpitations  ? Mononessa [Norgestimate-Eth Estradiol] Rash  ? Sulfa Antibiotics Rash  ?  Other reaction(s): GI Intolerance  ? Vancomycin Hives and Rash  ? ? ?Family History  ?Problem Relation Age of Onset  ? Hypertension Father   ? Pancreatic cancer Father   ? Depression Father   ? Heart attack Father   ? Irritable bowel syndrome Mother   ? Hypertension Mother   ? Asthma Mother   ? Hypertension Paternal Grandfather   ? Arthritis Paternal Grandfather   ? Heart attack Paternal Grandfather   ? Lung cancer Maternal Grandfather   ? Hypertension Maternal Grandfather   ? Diabetes Maternal Grandmother   ? Crohn's disease Maternal Grandmother   ? Arthritis Maternal Grandmother   ? Depression Maternal Grandmother   ? Hyperlipidemia Maternal Grandmother   ? Hypertension Maternal Grandmother   ? Kidney disease Maternal Grandmother   ? Mental illness Maternal Grandmother   ? Arthritis Paternal Grandmother   ? Hearing loss Paternal Grandmother   ? Hypertension Paternal Grandmother   ? Crohn's disease Paternal Grandmother   ? Miscarriages / Stillbirths Sister   ? Asthma Brother   ? Colon cancer Neg Hx   ? Rectal cancer Neg Hx   ? Stomach cancer Neg Hx   ? Other Neg Hx   ?     hypoparathyroidism  ? ? ?BP 124/76 (BP Location: Left Arm, Patient Position: Sitting, Cuff Size: Normal)   Pulse 96   Ht '5\' 7"'$  (1.702 m)   Wt 167 lb (75.8 kg)   SpO2 98%   BMI 26.16 kg/m?  ? ? ?Review of Systems ?Denies muscle cramps.  ?   ?Objective:  ? Physical Exam ?VITAL SIGNS:  See vs page ?GENERAL: no distress ?EXT: no leg edema ? ? ?25-OH Vit-D=27 ?Lab Results  ?Component Value Date  ? PTH 8 (L) 05/31/2020  ? CALCIUM 9.6 02/03/2022  ? CAION 0.89 (L) 12/16/2015  ? PHOS 4.9 (H) 01/04/2021  ? ? ?   ?Assessment & Plan:  ?Vit-D def: uncontrolled.  Increase to 5000 units/day ?Hypoparathyroidism:  overcontrolled.  Reduce to 3x0.5 mcg/d ?Recheck labs 30d ? ? ?

## 2022-02-03 NOTE — Patient Instructions (Signed)
Blood tests are requested for you today.  We'll let you know about the results.   ?You should have an endocrinology follow-up appointment in 6 months.  Please call sooner if the pregnancy happens.   ? ? ? ?

## 2022-02-16 ENCOUNTER — Encounter: Payer: Self-pay | Admitting: Endocrinology

## 2022-07-24 ENCOUNTER — Ambulatory Visit: Payer: Managed Care, Other (non HMO) | Admitting: Internal Medicine

## 2022-10-02 ENCOUNTER — Encounter: Payer: Self-pay | Admitting: Internal Medicine

## 2022-10-02 ENCOUNTER — Ambulatory Visit (INDEPENDENT_AMBULATORY_CARE_PROVIDER_SITE_OTHER): Payer: Managed Care, Other (non HMO) | Admitting: Internal Medicine

## 2022-10-02 VITALS — BP 146/88 | HR 89 | Ht 67.0 in | Wt 155.6 lb

## 2022-10-02 DIAGNOSIS — E209 Hypoparathyroidism, unspecified: Secondary | ICD-10-CM

## 2022-10-02 DIAGNOSIS — E559 Vitamin D deficiency, unspecified: Secondary | ICD-10-CM

## 2022-10-02 LAB — MAGNESIUM: Magnesium: 1.6 mg/dL (ref 1.5–2.5)

## 2022-10-02 LAB — VITAMIN D 25 HYDROXY (VIT D DEFICIENCY, FRACTURES): VITD: 27.64 ng/mL — ABNORMAL LOW (ref 30.00–100.00)

## 2022-10-02 LAB — PHOSPHORUS: Phosphorus: 4.6 mg/dL (ref 2.3–4.6)

## 2022-10-02 NOTE — Progress Notes (Signed)
Patient ID: Alisha Copeland, female   DOB: 02/18/1986, 36 y.o.   MRN: [id number]  HPI  Alisha Copeland is a 36 y.o.-year-old female,  returning for follow-up for idiopathic hypoparathyroidism and vitamin D deficiency.  She previously saw Dr. Loanne Drilling, last visit with him 8 months ago.  Pt was dx with hypocalcemia at age 20.  Genetic testing was reportedly negative.  She was admitted to the hospital and was in ICU for acute renal failure at age 36 due to overtreatment.  However, no further ED visits or hospitalizations since.  She declined Natpara when it was available - per review of Dr. Cordelia Pen notes.  She is b'feeding her 75 month son. On a prenatal vitamin.  She has 2 children.  She is contemplating another pregnancy but not quite now.  She had to reduce the doses of her supplements during the previous pregnancies.  I reviewed pt's pertinent labs: Lab Results  Component Value Date   PTH 8 (L) 05/31/2020   PTH 1 (L) 09/30/2019   PTH 1 (L) 12/13/2018   PTH 1 (L) 11/06/2018   PTH 19 01/14/2018   PTH 3 (L) 01/10/2017   PTH 2 (L) 06/06/2016   PTH 3 (L) 02/04/2016   PTH 2 (L) 10/29/2015   PTH 2 (L) 04/28/2015   CALCIUM 9.6 02/03/2022   CALCIUM 8.2 (L) 11/11/2021   CALCIUM 7.5 (L) 09/30/2021   CALCIUM 9.1 08/29/2021   CALCIUM 8.9 07/07/2021   CALCIUM 8.9 06/17/2021   CALCIUM 8.3 (L) 01/04/2021   CALCIUM 8.1 (A) 09/01/2020   CALCIUM 8.4 (L) 05/31/2020   CALCIUM 8.5 (A) 03/11/2020   No history of osteoporosis or fracture.  No hand cramping or perioral numbness.  No h/o kidney stones.  She has a h/o vitamin D deficiency.  Reviewed vit D levels: Lab Results  Component Value Date   VD25OH 25.50 (L) 02/03/2022   VD25OH 34.55 09/30/2021   VD25OH 23.07 (L) 06/17/2021   VD25OH 23.81 (L) 01/04/2021   VD25OH 24.62 (L) 05/31/2020   VD25OH 20.37 (L) 09/30/2019   VD25OH 28.01 (L) 06/03/2019   VD25OH 29.29 (L) 03/18/2019   VD25OH 21.08 (L) 03/04/2019   VD25OH 27.36 (L)  12/13/2018   Pt is on: - vitamin D 5000 units daily  - calcitriol 0.5 mg 4x >> 3x a day (since 01/2022)  Of note, latest renal ultrasound (01/25/2017) was normal.  No h/o CKD. Last BUN/Cr: Lab Results  Component Value Date   BUN 20 02/03/2022   CREATININE 0.78 02/03/2022   Pt does not have a FH of hypocalcemia.    I reviewed her chart and she also has a history of IDA, GERD, migraines, celiac disease, POTS - seen at [facility].  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 ? congenital. No sign of vasculitis or RAS.   GERD (gastroesophageal reflux disease)    History of iron deficiency anemia    +hx of iron infusion, hx of transfusion 2020 pregnancy   Hypertension    multiple w/u's for secondary HTN unrevealing/NEG   Idiopathic hypoparathyroidism ([place])    formerly followed by peds endo at [facility]. Dr. Loanne Drilling as adult.   Migraine syndrome    infrequent.  Remote hx of effexor for proph, +imitrex abortive med.  HAs infrequent.   Mild intermittent asthma  POTS (postural orthostatic tachycardia syndrome)    Past Surgical History:  Procedure Laterality Date   CESAREAN SECTION N/A 07/25/2019   Procedure: CESAREAN SECTION;  Surgeon: Tyson Dense, MD;  Location: [facility] LD ORS;  Service: Obstetrics;  Laterality: N/A;   CESAREAN SECTION N/A 11/11/2021   Procedure: REPEAT CESAREAN SECTION EDC: 11-25-21 ALLERG: VANCOMYCIN, MESTINON, SULFA, CIPRO, MACROBID  PREVIOUS X 1;  Surgeon: Everlene Farrier, MD;  Location: [facility] LD ORS;  Service: Obstetrics;  Laterality: N/A;   TRANSTHORACIC ECHOCARDIOGRAM  07/24/2017   NORMAL   US KIDNEY BILATERAL ([place] HX)  01/26/2017   NO MASS OR HYDRO. LEFT KIDNEY 11.5CM. NO MASS OR HYDRO   Social History   Socioeconomic History   Marital status: Married    Spouse name: Not on file   Number of children: Not  on file   Years of education: Not on file   Highest education level: Not on file  Occupational History   Not on file  Tobacco Use   Smoking status: Never   Smokeless tobacco: Never  Vaping Use   Vaping Use: Never used  Substance and Sexual Activity   Alcohol use: Yes    Comment: occassionaly   Drug use: No   Sexual activity: Yes  Other Topics Concern   Not on file  Social History Narrative   Married, 1 son as of 08/2020.   [place] native.   Educ: BA ([employer], [employer], [employer], [employer]).   Occup: pre-K teacher   No tob.   Rare ETOH.   Social Determinants of Health   Financial Resource Strain: Not on file  Food Insecurity: Not on file  Transportation Needs: Not on file  Physical Activity: Not on file  Stress: Not on file  Social Connections: Not on file  Intimate Partner Violence: Not on file   Current Outpatient Medications on File Prior to Visit  Medication Sig Dispense Refill   calcitRIOL (ROCALTROL) 0.5 MCG capsule Take 3 capsules (1.5 mcg total) by mouth daily. 270 capsule 1   Cholecalciferol 125 MCG (5000 UT) TABS Take 1 tablet (5,000 Units total) by mouth daily. 100 tablet 3   Prenatal MV & Min w/FA-DHA (PRENATAL ADULT GUMMY/DHA/FA PO) Take 2 tablets by mouth every evening.     labetalol (NORMODYNE) 100 MG tablet Take 3 tablets (300 mg total) by mouth 3 (three) times daily. (Patient not taking: Reported on 10/02/2022) 90 tablet 1   No current facility-administered medications on file prior to visit.   Allergies  Allergen Reactions   Cisapride Other (See Comments)    Prolonged QT syndrome   Methylphenidate Hcl Other (See Comments)    Increased tachycardia   Propranolol Other (See Comments)    Lethargy and fatigue   Sertraline Hcl Diarrhea   Amphetamine Palpitations   Dextroamphetamine Palpitations   Mononessa [Norgestimate-Eth Estradiol] Rash   Sulfa Antibiotics Rash    Other reaction(s): GI Intolerance   Vancomycin Hives and Rash   Family History   Problem Relation Age of Onset   Hypertension Father    Pancreatic cancer Father    Depression Father    Heart attack Father    Irritable bowel syndrome Mother    Hypertension Mother    Asthma Mother    Hypertension Paternal Grandfather    Arthritis Paternal Grandfather    Heart attack Paternal Grandfather    Lung cancer Maternal Grandfather    Hypertension Maternal Grandfather    Diabetes Maternal Grandmother    Crohn's disease Maternal Grandmother  Arthritis Maternal Grandmother    Depression Maternal Grandmother    Hyperlipidemia Maternal Grandmother    Hypertension Maternal Grandmother    Kidney disease Maternal Grandmother    Mental illness Maternal Grandmother    Arthritis Paternal Grandmother    Hearing loss Paternal Grandmother    Hypertension Paternal Grandmother    Crohn's disease Paternal 30 / Stillbirths Sister    Asthma Brother    Colon cancer Neg Hx    Rectal cancer Neg Hx    Stomach cancer Neg Hx    Other Neg Hx        hypoparathyroidism    PE: BP (!) 146/88 (BP Location: Left Arm, Patient Position: Sitting, Cuff Size: Normal)   Pulse 89   Ht '5\' 7"'$  (1.702 m)   Wt 155 lb 9.6 oz (70.6 kg)   SpO2 96%   BMI 24.37 kg/m  Wt Readings from Last 3 Encounters:  10/02/22 155 lb 9.6 oz (70.6 kg)  02/03/22 167 lb (75.8 kg)  11/04/21 185 lb (83.9 kg)   Constitutional: Normal weight, in NAD Eyes:  EOMI, no exophthalmos ENT: no neck masses, no cervical lymphadenopathy Cardiovascular: RRR, No MRG Respiratory: CTA B Musculoskeletal: no deformities Skin:no rashes Neurological: no tremor with outstretched hands  Assessment: 1.  Idiopathic hypoparathyroidism  2.  Vitamin D deficiency  PLAN: 1. Patient with long history of longstanding hypocalcemia diagnosed as idiopathic hypoparathyroidism during childhood - she was admitted at 36 years old from complications of overtreatment of her hypocalcemia, with acute kidney injury, but no  similar events since then. - at today's visit, she does not have apparent signs or symptoms of hypocalcemia: no perioral numbness, no acral cramping - her vitamin D level has not been elevated to suggest resistance to vitamin D action; it is possible that she had autoimmune parathyroid destruction. - she is currently on vitamin D, calcitriol, but not calcium.  She has no problems tolerating these.  We discussed that after the results return, we may decrease the calcitriol further and add 1 tablet of calcium with dinner, depending on the results - we will check the following tests today: Ionized calcium level Magnesium Phosphorus (however, she just ate pizza ~30 minutes ago) Calcitriol level  Also, due to the longstanding hypocalcemia, on treatment, will obtain a renal ultrasound to check for renal calcinosis -latest renal ultrasound was in 2018 and was normal - there is no point to repeating her parathyroid hormone level - at next visit, we may check a 24-hour urine for calcium -Upon her questioning, we discussed about the fact that usually during pregnancy, calcitriol and PTH levels improve and we have to be very vigilant and decrease the doses of her supplements to avoid hypercalcemia.  This is especially important in the second half of the pregnancy. - I we will see her back in 6 months and if labs are normal, we may spread out the visits every year afterwards  2.  Vitamin D deficiency -Reviewed previous vitamin D level from last visit with Dr. Loanne Drilling, still under our goal of 30 -She continues on 5000 units vitamin D daily -We will recheck the level today  - Total time spent for the visit: 40 min, in pre- and post-charting, reviewing Dr. Cordelia Pen last note, obtaining medical information from the chart and from the pt, reviewing her  previous labs, evaluations, and treatments, reviewing her symptoms, counseling her about her endocrine conditions (please see the discussed topics above), and  developing a plan to  further treat it; she had a number of questions which I addressed.  Component     Latest Ref Rng 10/02/2022  Magnesium     1.5 - 2.5 mg/dL 1.6   VITD     30.00 - 100.00 ng/mL 27.64 (L)   Phosphorus     2.3 - 4.6 mg/dL 4.6   Calcium Ionized     4.7 - 5.5 mg/dL 4.3 (L)   Vitamin D 1, 25 (OH) Total     18 - 72 pg/mL 19   Vitamin D3 1, 25 (OH)     pg/mL 19   Vitamin D2 1, 25 (OH)     pg/mL <8     Vitamin D is a little too low.  She may be missing some of the vitamin D doses.  For now, I would not suggest any increase in dose but will recheck this at next visit.  Ionized calcium is a little too low.  Will suggest to add 1 tablet of Tums with dinner. Calcitriol level is at the lower limit of the normal range.  For now, I will suggest to continue the same dose.  Phosphorus is at the upper limit of the target range, but she just had lunch. I would like to recheck her ionized calcium in 2 weeks.  Philemon Kingdom, MD PhD [facility]

## 2022-10-02 NOTE — Patient Instructions (Signed)
Please stop at the lab.  You should have an endocrinology follow-up appointment in 6 months.

## 2022-10-03 ENCOUNTER — Telehealth: Payer: Self-pay

## 2022-10-03 NOTE — Telephone Encounter (Signed)
Order was received for renal ultrasound. The only dx listed was history of Hypoparathyroidism but needs a dx that is more closely related to the renal. Need to know what you may be trying to rule out. Please advise.

## 2022-10-04 ENCOUNTER — Other Ambulatory Visit: Payer: Self-pay | Admitting: Internal Medicine

## 2022-10-04 DIAGNOSIS — E209 Hypoparathyroidism, unspecified: Secondary | ICD-10-CM

## 2022-10-04 DIAGNOSIS — Z9189 Other specified personal risk factors, not elsewhere classified: Secondary | ICD-10-CM

## 2022-10-04 DIAGNOSIS — E559 Vitamin D deficiency, unspecified: Secondary | ICD-10-CM

## 2022-10-04 NOTE — Telephone Encounter (Signed)
The guidelines for hypoparathyroidism recommend renal ultrasound every few years to rule out renal calcinosis.  I did add another diagnosis of " at risk for renal compromise" but I do not see any other possible association.  The test was previously ordered by Dr. Loanne Drilling with the same diagnosis.

## 2022-10-04 NOTE — Telephone Encounter (Signed)
Called and spoke with Elmyra Ricks with [facility] and provided additional dx and information. They will contact pt for scheduling.

## 2022-10-06 LAB — VITAMIN D 1,25 DIHYDROXY
Vitamin D 1, 25 (OH)2 Total: 19 pg/mL (ref 18–72)
Vitamin D2 1, 25 (OH)2: <8 pg/mL
Vitamin D3 1, 25 (OH)2: 19 pg/mL

## 2022-10-06 LAB — CALCIUM, IONIZED: Calcium, Ion: 4.3 mg/dL — ABNORMAL LOW (ref 4.7–5.5)

## 2022-10-26 ENCOUNTER — Other Ambulatory Visit: Payer: Self-pay

## 2022-11-08 ENCOUNTER — Other Ambulatory Visit (INDEPENDENT_AMBULATORY_CARE_PROVIDER_SITE_OTHER): Payer: Managed Care, Other (non HMO)

## 2022-11-08 DIAGNOSIS — E209 Hypoparathyroidism, unspecified: Secondary | ICD-10-CM

## 2022-11-09 LAB — CALCIUM, IONIZED: Calcium, Ion: 4.5 mg/dL — ABNORMAL LOW (ref 4.7–5.5)

## 2022-11-13 ENCOUNTER — Encounter: Payer: Self-pay | Admitting: Internal Medicine

## 2022-11-13 DIAGNOSIS — E209 Hypoparathyroidism, unspecified: Secondary | ICD-10-CM

## 2022-11-13 MED ORDER — CALCITRIOL 0.5 MCG PO CAPS: 1.5000 ug | ORAL_CAPSULE | Freq: Every day | ORAL | 1 refills | Status: DC

## 2022-11-15 ENCOUNTER — Ambulatory Visit
Admission: RE | Admit: 2022-11-15 | Discharge: 2022-11-15 | Disposition: A | Discharge status: Home / Self Care | Payer: Managed Care, Other (non HMO) | Source: Ambulatory Visit | Attending: Internal Medicine | Admitting: Internal Medicine

## 2022-11-15 DIAGNOSIS — E209 Hypoparathyroidism, unspecified: Secondary | ICD-10-CM

## 2022-11-16 ENCOUNTER — Encounter: Payer: Self-pay | Admitting: Internal Medicine

## 2022-11-16 ENCOUNTER — Other Ambulatory Visit: Payer: Self-pay | Admitting: Internal Medicine

## 2022-11-16 DIAGNOSIS — N2889 Other specified disorders of kidney and ureter: Secondary | ICD-10-CM

## 2022-11-19 ENCOUNTER — Ambulatory Visit
Admission: RE | Admit: 2022-11-19 | Discharge: 2022-11-19 | Discharge status: Home / Self Care | Payer: Managed Care, Other (non HMO) | Source: Ambulatory Visit | Attending: Internal Medicine | Admitting: Internal Medicine

## 2022-11-19 DIAGNOSIS — N2889 Other specified disorders of kidney and ureter: Secondary | ICD-10-CM

## 2022-11-19 MED ORDER — GADOPICLENOL 0.5 MMOL/ML IV SOLN
7.0000 mL | Freq: Once | INTRAVENOUS | Status: AC | PRN
  Administered 2022-11-19: 7 mL via INTRAVENOUS

## 2022-12-04 ENCOUNTER — Other Ambulatory Visit: Payer: Managed Care, Other (non HMO)

## 2022-12-28 LAB — BASIC METABOLIC PANEL
BUN: 17 (ref 4–21)
CO2: 30 — AB (ref 13–22)
Chloride: 98 — AB (ref 99–108)
Glucose: 79
Potassium: 3.6 mEq/L (ref 3.5–5.1)
Sodium: 139 (ref 137–147)

## 2022-12-28 LAB — COMPREHENSIVE METABOLIC PANEL
Albumin: 4.7 (ref 3.5–5.0)
Calcium: 9 (ref 8.7–10.7)
eGFR: 104

## 2022-12-30 LAB — MICROALBUMIN / CREATININE URINE RATIO: Microalb Creat Ratio: 11

## 2022-12-30 LAB — PROTEIN / CREATININE RATIO, URINE
Albumin, U: 11.3
Creatinine, Urine: 98.6

## 2023-04-03 ENCOUNTER — Ambulatory Visit (INDEPENDENT_AMBULATORY_CARE_PROVIDER_SITE_OTHER): Payer: Managed Care, Other (non HMO) | Admitting: Internal Medicine

## 2023-04-03 ENCOUNTER — Encounter: Payer: Self-pay | Admitting: Internal Medicine

## 2023-04-03 VITALS — BP 110/60 | HR 77 | Ht 67.0 in | Wt 155.6 lb

## 2023-04-03 DIAGNOSIS — E559 Vitamin D deficiency, unspecified: Secondary | ICD-10-CM | POA: Diagnosis not present

## 2023-04-03 DIAGNOSIS — E209 Hypoparathyroidism, unspecified: Secondary | ICD-10-CM

## 2023-04-03 NOTE — Patient Instructions (Addendum)
Please stop at the lab.  Patient information (Up-to-Date): Collection of a 24-hour urine specimen  - You should collect every drop of urine during each 24-hour period. It does not matter how much or little urine is passed each time, as long as every drop is collected. - Begin the urine collection in the morning after you wake up, after you have emptied your bladder for the first time. - Urinate (empty the bladder) for the first time and flush it down the toilet. Note the exact time (eg, 6:15 AM). You will begin the urine collection at this time. - Collect every drop of urine during the day and night in an empty collection bottle. Store the bottle at room temperature or in the refrigerator. - If you need to have a bowel movement, any urine passed with the bowel movement should be collected. Try not to include feces with the urine collection. If feces does get mixed in, do not try to remove the feces from the urine collection bottle. - Finish by collecting the first urine passed the next morning, adding it to the collection bottle. This should be within ten minutes before or after the time of the first morning void on the first day (which was flushed). In this example, you would try to void between 6:05 and 6:25 on the second day. - If you need to urinate one hour before the final collection time, drink a full glass of water so that you can void again at the appropriate time. If you have to urinate 20 minutes before, try to hold the urine until the proper time. - Please note the exact time of the final collection, even if it is not the same time as when collection began on day 1. - The bottle(s) may be kept at room temperature for a day or two, but should be kept cool or refrigerated for longer periods of time.   You should have an endocrinology follow-up appointment in 6 months.

## 2023-04-03 NOTE — Progress Notes (Addendum)
Patient ID: Alisha Copeland, female   DOB: 1986-06-18, 37 y.o.   MRN: [id number]  HPI  Alisha Copeland is a 37 y.o.-year-old female,  returning for follow-up for idiopathic hypoparathyroidism and vitamin D deficiency.  She previously saw Dr. Everardo All, but last visit with me 6 months ago.  Interim history: She feels well, without complaints today.  She continues to have occasional cramps in her feet, not necessarily relieved by Tums. She is contemplating a new pregnancy within the next few months.  Reviewed and addended history: Pt was dx with hypocalcemia at age 37.  Genetic testing was reportedly negative.  She was admitted to the hospital and was in ICU for acute renal failure at age 37 due to overtreatment.  However, no further ED visits or hospitalizations since.  She declined Natpara when it was available - per review of Dr. George Hugh notes.  She is b'feeding her 78 month son. On a prenatal vitamin.  She has 2 children.  She is contemplating another pregnancy but not quite now.  She had to reduce the doses of her supplements during the previous pregnancies.  I reviewed pt's pertinent labs: Lab Results  Component Value Date   PTH 8 (L) 05/31/2020   PTH 1 (L) 09/30/2019   PTH 1 (L) 12/13/2018   PTH 1 (L) 11/06/2018   PTH 19 01/14/2018   PTH 3 (L) 01/10/2017   PTH 2 (L) 06/06/2016   PTH 3 (L) 02/04/2016   PTH 2 (L) 10/29/2015   PTH 2 (L) 04/28/2015   CALCIUM 9.0 12/28/2022   CALCIUM 9.6 02/03/2022   CALCIUM 8.2 (L) 11/11/2021   CALCIUM 7.5 (L) 09/30/2021   CALCIUM 9.1 08/29/2021   CALCIUM 8.9 07/07/2021   CALCIUM 8.9 06/17/2021   CALCIUM 8.3 (L) 01/04/2021   CALCIUM 8.1 (A) 09/01/2020   CALCIUM 8.4 (L) 05/31/2020   No history of osteoporosis or fracture.  No hand cramping or perioral numbness.  She does have foot cramps occasionally.  No h/o kidney stones.  She has a h/o vitamin D deficiency.  Reviewed vit D levels: Lab Results  Component Value Date   VD25OH  27.64 (L) 10/02/2022   VD25OH 25.50 (L) 02/03/2022   VD25OH 34.55 09/30/2021   VD25OH 23.07 (L) 06/17/2021   VD25OH 23.81 (L) 01/04/2021   VD25OH 24.62 (L) 05/31/2020   VD25OH 20.37 (L) 09/30/2019   VD25OH 28.01 (L) 06/03/2019   VD25OH 29.29 (L) 03/18/2019   VD25OH 21.08 (L) 03/04/2019   Pt is on: - vitamin D 5000 units daily  - calcitriol 0.5 mg 4x >> 3x a day (since 01/2022)  Other labs reviewed: Component     Latest Ref Rng 10/02/2022 11/08/2022  Vitamin D 1, 25 (OH) Total     18 - 72 pg/mL 19    Vitamin D3 1, 25 (OH)     pg/mL 19    Vitamin D2 1, 25 (OH)     pg/mL <8    Calcium Ionized     4.7 - 5.5 mg/dL 4.3 (L)  4.5 (L)   VITD     30.00 - 100.00 ng/mL 27.64 (L)    Phosphorus     2.3 - 4.6 mg/dL 4.6    Magnesium     1.5 - 2.5 mg/dL 1.6      Renal ultrasound (01/25/2017) was normal Renal ultrasound (11/15/2022) showed a 1.9 cm hypoechoic mass in the mid right kidney Renal MRI (11/19/2022) showed that the mass is actually a benign-appearing cyst  No  h/o CKD. Last BUN/Cr: Lab Results  Component Value Date   BUN 17 12/28/2022   CREATININE 0.78 02/03/2022   Pt does not have a FH of hypocalcemia.  I reviewed her chart and she also has a history of IDA, GERD, migraines, celiac disease, POTS - seen at [facility].  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 ? congenital. No sign of vasculitis or RAS.   GERD (gastroesophageal reflux disease)    History of iron deficiency anemia    +hx of iron infusion, hx of transfusion 2020 pregnancy   Hypertension    multiple w/u's for secondary HTN unrevealing/NEG   Idiopathic hypoparathyroidism (HCC)    formerly followed by peds endo at [facility]. Dr. Everardo All as adult.   Migraine syndrome    infrequent.  Remote hx of effexor for proph, +imitrex abortive med.  HAs infrequent.   Mild  intermittent asthma    POTS (postural orthostatic tachycardia syndrome)    Past Surgical History:  Procedure Laterality Date   CESAREAN SECTION N/A 07/25/2019   Procedure: CESAREAN SECTION;  Surgeon: Ranae Pila, MD;  Location: [facility] LD ORS;  Service: Obstetrics;  Laterality: N/A;   CESAREAN SECTION N/A 11/11/2021   Procedure: REPEAT CESAREAN SECTION EDC: 11-25-21 ALLERG: VANCOMYCIN, MESTINON, SULFA, CIPRO, MACROBID  PREVIOUS X 1;  Surgeon: Harold Hedge, MD;  Location: [facility] LD ORS;  Service: Obstetrics;  Laterality: N/A;   TRANSTHORACIC ECHOCARDIOGRAM  07/24/2017   NORMAL   US KIDNEY BILATERAL ([facility] HX)  01/26/2017   NO MASS OR HYDRO. LEFT KIDNEY 11.5CM. NO MASS OR HYDRO   Social History   Socioeconomic History   Marital status: Married    Spouse name: Not on file   Number of children: Not on file   Years of education: Not on file   Highest education level: Not on file  Occupational History   Not on file  Tobacco Use   Smoking status: Never   Smokeless tobacco: Never  Vaping Use   Vaping Use: Never used  Substance and Sexual Activity   Alcohol use: Yes    Comment: occassionaly   Drug use: No   Sexual activity: Yes  Other Topics Concern   Not on file  Social History Narrative   Married, 1 son as of 08/2020.   [place] native.   Educ: BA ([employer], [employer], [employer], [employer]).   Occup: pre-K teacher   No tob.   Rare ETOH.   Social Determinants of Health   Financial Resource Strain: Not on file  Food Insecurity: Not on file  Transportation Needs: Not on file  Physical Activity: Not on file  Stress: Not on file  Social Connections: Not on file  Intimate Partner Violence: Not on file   Current Outpatient Medications on File Prior to Visit  Medication Sig Dispense Refill   calcitRIOL (ROCALTROL) 0.5 MCG capsule Take 3 capsules (1.5 mcg total) by mouth daily. 270 capsule 1   Cholecalciferol 125 MCG (5000 UT) TABS Take 1 tablet (5,000 Units total) by mouth daily.  100 tablet 3   labetalol (NORMODYNE) 100 MG tablet Take 3 tablets (300 mg total) by mouth 3 (three) times daily. (Patient taking differently: Take 300 mg by mouth daily. 1 tablet once a day) 90 tablet 1   Prenatal MV & Min w/FA-DHA (PRENATAL  ADULT GUMMY/DHA/FA PO) Take 2 tablets by mouth every evening.     No current facility-administered medications on file prior to visit.   Allergies  Allergen Reactions   Cisapride Other (See Comments)    Prolonged QT syndrome   Methylphenidate Hcl Other (See Comments)    Increased tachycardia   Propranolol Other (See Comments)    Lethargy and fatigue   Sertraline Hcl Diarrhea   Amphetamine Palpitations   Dextroamphetamine Palpitations   Mononessa [Norgestimate-Eth Estradiol] Rash   Sulfa Antibiotics Rash    Other reaction(s): GI Intolerance   Vancomycin Hives and Rash   Family History  Problem Relation Age of Onset   Hypertension Father    Pancreatic cancer Father    Depression Father    Heart attack Father    Irritable bowel syndrome Mother    Hypertension Mother    Asthma Mother    Hypertension Paternal Grandfather    Arthritis Paternal Grandfather    Heart attack Paternal Grandfather    Lung cancer Maternal Grandfather    Hypertension Maternal Grandfather    Diabetes Maternal Grandmother    Crohn's disease Maternal Grandmother    Arthritis Maternal Grandmother    Depression Maternal Grandmother    Hyperlipidemia Maternal Grandmother    Hypertension Maternal Grandmother    Kidney disease Maternal Grandmother    Mental illness Maternal Grandmother    Arthritis Paternal Grandmother    Hearing loss Paternal Grandmother    Hypertension Paternal Grandmother    Crohn's disease Paternal Grandmother    Miscarriages / Stillbirths Sister    Asthma Brother    Colon cancer Neg Hx    Rectal cancer Neg Hx    Stomach cancer Neg Hx    Other Neg Hx        hypoparathyroidism   PE: BP 110/60   Pulse 77   Ht 5\' 7"  (1.702 m)   Wt 155 lb  9.6 oz (70.6 kg)   SpO2 99%   BMI 24.37 kg/m  Wt Readings from Last 3 Encounters:  04/03/23 155 lb 9.6 oz (70.6 kg)  10/02/22 155 lb 9.6 oz (70.6 kg)  02/03/22 167 lb (75.8 kg)   Constitutional: Normal weight, in NAD Eyes:  EOMI, no exophthalmos ENT: no neck masses, no cervical lymphadenopathy Cardiovascular: RRR, No MRG Respiratory: CTA B Musculoskeletal: no deformities Skin:no rashes Neurological: no tremor with outstretched hands  Assessment: 1.  Idiopathic hypoparathyroidism  2.  Vitamin D deficiency  3.  Renal cyst  PLAN: 1. Patient with long history of hypocalcemia and diagnosis idiopathic hypoparathyroidism during childhood.  She was admitted at 37 years old for complications of overtreatment of her hypocalcemia, with acute kidney injury but no similar events since then. -I first saw the patient 6 months ago and at that time she did not have apparent signs or symptoms of hypocalcemia.  She still does not have perioral numbness or acral cramping. -The etiology of her hyperparathyroidism is not clear.  She does not have an elevated vitamin D level to suggest resistance to vitamin D action.  It is possible that she had autoimmune parathyroid destruction. -At last visit, we continued vitamin D and calcitriol at the same doses and I did advise her to add 1 tablet of calcium with dinner since ionized calcium was slightly low.  Phosphorus and magnesium levels were normal. -After adding lithium, the ionized calcium improved.  Most recent total calcium levels were normal (12/28/2022) -Per guidelines, at last visit I recommended a renal ultrasound to check for  renal calcinosis.  Of note, the renal ultrasound from 2018 was normal.  He had a renal ultrasound 11/15/2022 which showed a 1.9 cm hypoechoic mass in the mid right kidney.  Renal MRI on 11/19/2022 showed that this was actually a benign appearing cyst. -At today's visit, we will check a 24-hour urine for calcium to make sure she is not  overly supplemented with calcium or calcitriol -She is contemplating a pregnancy.  We discussed about the fact that usually during pregnancy, calcitriol and PTH levels improve and we have to be very vigilant and decrease the doses of her supplements to avoid hypercalcemia.  This is especially important in the second half of the pregnancy. - I will see her back in 6 mo if labs are normal today  2.  Vitamin D deficiency -At last visit, vit. D was slightly low so I advised her to take her vitamin D 5000 units daily consistently -She continues on this dose now -We will recheck the level today  3.  Renal cyst -Per her nephrologist, this could be due to hypertension. -Plan to repeat another ultrasound in approximately 1 to 1.5 years from the previous  Component     Latest Ref Rng 04/03/2023  VITD     30.00 - 100.00 ng/mL 38.65   Calcium Ionized     4.7 - 5.5 mg/dL 4.6 (L)   Ionized calcium is only slightly low, improved. Vitamin D level is normal.  Component     Latest Ref Rng 04/17/2023  Calcium, 24H Urine     mg/24 h 426 (H)   Creatinine, 24H Ur     0.50 - 2.15 g/24 h 1.38   Very elevated 24-hour urine calcium.  This is probably due to her calcitriol.  At this point, I will advise her to try to reduce the dose of calcitriol from 0.5 mcg 3x a day, to only 2x a day.  I will then have her come back for labs in 2 weeks.  At that time, we will check another ionized calcium and 24-hour urine calcium.  If the ionized calcium decreases, we may need to go back to the previous dose of calcitriol and add HCTZ.  However, I would like to avoid doing so now, since she is contemplating pregnancy.   Carlus Pavlov, MD PhD [facility] Endocrinology

## 2023-04-04 LAB — CALCIUM, IONIZED: Calcium, Ion: 4.6 mg/dL — ABNORMAL LOW (ref 4.7–5.5)

## 2023-04-04 LAB — VITAMIN D 25 HYDROXY (VIT D DEFICIENCY, FRACTURES): VITD: 38.65 ng/mL (ref 30.00–100.00)

## 2023-04-16 ENCOUNTER — Other Ambulatory Visit: Payer: Managed Care, Other (non HMO)

## 2023-04-18 ENCOUNTER — Encounter: Payer: Self-pay | Admitting: Internal Medicine

## 2023-04-18 LAB — CALCIUM, URINE, 24 HOUR: Calcium, 24H Urine: 426 mg/24 h — ABNORMAL HIGH

## 2023-04-18 LAB — CREATININE, URINE, 24 HOUR: Creatinine, 24H Ur: 1.38 g/(24.h) (ref 0.50–2.15)

## 2023-04-18 MED ORDER — CALCITRIOL 0.5 MCG PO CAPS: 0.5000 ug | ORAL_CAPSULE | Freq: Two times a day (BID) | ORAL | 1 refills | Status: DC

## 2023-04-18 NOTE — Telephone Encounter (Signed)
Fyi.

## 2023-04-18 NOTE — Addendum Note (Signed)
Addended by: Carlus Pavlov on: 04/18/2023 12:48 PM   Modules accepted: Orders

## 2023-04-24 LAB — CALCIUM, URINE, 24 HOUR: Calcium, 24H Urine: 393 mg/24 h — ABNORMAL HIGH

## 2023-04-24 LAB — CREATININE, URINE, 24 HOUR: Creatinine, 24H Ur: 1.37 g/(24.h) (ref 0.50–2.15)

## 2023-04-24 NOTE — Addendum Note (Signed)
Addended by: Carlus Pavlov on: 04/24/2023 12:07 PM   Modules accepted: Orders

## 2023-04-27 ENCOUNTER — Other Ambulatory Visit: Payer: Managed Care, Other (non HMO)

## 2023-05-07 ENCOUNTER — Other Ambulatory Visit (INDEPENDENT_AMBULATORY_CARE_PROVIDER_SITE_OTHER): Payer: Managed Care, Other (non HMO)

## 2023-05-07 DIAGNOSIS — E209 Hypoparathyroidism, unspecified: Secondary | ICD-10-CM

## 2023-05-08 LAB — CALCIUM, URINE, 24 HOUR: Calcium, 24H Urine: 272 mg/24 h — ABNORMAL HIGH

## 2023-05-08 LAB — CALCIUM, IONIZED: Calcium, Ion: 4.7 mg/dL (ref 4.7–5.5)

## 2023-08-03 ENCOUNTER — Inpatient Hospital Stay (HOSPITAL_COMMUNITY)
Admission: AD | Admit: 2023-08-03 | Discharge: 2023-08-03 | Disposition: A | Discharge status: Home / Self Care | Payer: Managed Care, Other (non HMO) | Attending: Obstetrics & Gynecology | Admitting: Obstetrics & Gynecology

## 2023-08-03 ENCOUNTER — Encounter (HOSPITAL_COMMUNITY): Payer: Self-pay | Admitting: *Deleted

## 2023-08-03 DIAGNOSIS — O209 Hemorrhage in early pregnancy, unspecified: Secondary | ICD-10-CM | POA: Insufficient documentation

## 2023-08-03 DIAGNOSIS — Z23 Encounter for immunization: Secondary | ICD-10-CM | POA: Diagnosis not present

## 2023-08-03 DIAGNOSIS — Z3A01 Less than 8 weeks gestation of pregnancy: Secondary | ICD-10-CM | POA: Diagnosis not present

## 2023-08-03 DIAGNOSIS — O26891 Other specified pregnancy related conditions, first trimester: Secondary | ICD-10-CM | POA: Diagnosis not present

## 2023-08-03 LAB — HCG, QUANTITATIVE, PREGNANCY: hCG, Beta Chain, Quant, S: 10 m[IU]/mL — ABNORMAL HIGH (ref <5)

## 2023-08-03 LAB — POCT PREGNANCY, URINE: Preg Test, Ur: NEGATIVE

## 2023-08-03 MED ORDER — RHO D IMMUNE GLOBULIN 1500 UNIT/2ML IJ SOSY
300.0000 ug | PREFILLED_SYRINGE | Freq: Once | INTRAMUSCULAR | Status: AC
  Administered 2023-08-03: 300 ug via INTRAMUSCULAR
  Filled 2023-08-03: qty 2

## 2023-08-03 NOTE — MAU Provider Note (Signed)
History     CSN: [id number]  Arrival date and time: 08/03/23 1407   None     Chief Complaint  Patient presents with   Vaginal Bleeding   HPI Alisha Copeland is a 37 y.o. [id number] at ?[redacted]w[redacted]d who presents today with complaint of vaginal bleeding in setting of early pregnancy. She reports taking a home UPT on 10/8 which was positive and had several UPTs since that were all positive. Reports her first missed period was 10/15. The following day, she began experiencing vaginal bleeding. She reports some mild pain and cramping at the time. She reports blood looked like clots, but was mainly in the toilet bowl so unsure if there was any tissue. She does not have any bleeding or pain today. She called her OB who recommended evaluation in MAU. She denies any fevers, chills, nausea, vomiting, lightheadedness, urinary symptoms, abnormal vaginal discharge or vulvovaginal discomfort.  Past Medical History:  Diagnosis Date   Allergic rhinitis    Anemia    Celiac artery stenosis (HCC)    + SMA stenosis->detected during w/u for poss secondary HTN (looking for RAS). ? congenital. No sign of vasculitis or RAS.   GERD (gastroesophageal reflux disease)    History of iron deficiency anemia    +hx of iron infusion, hx of transfusion 2020 pregnancy   Hypertension    multiple w/u's for secondary HTN unrevealing/NEG   Idiopathic hypoparathyroidism (HCC)    formerly followed by peds endo at [facility]. Dr. Everardo All as adult.   Migraine syndrome    infrequent.  Remote hx of effexor for proph, +imitrex abortive med.  HAs infrequent.   Mild intermittent asthma    POTS (postural orthostatic tachycardia syndrome)     Past Surgical History:  Procedure Laterality Date   CESAREAN SECTION N/A 07/25/2019   Procedure: CESAREAN SECTION;  Surgeon: Ranae Pila, MD;  Location: [facility] LD ORS;  Service: Obstetrics;  Laterality: N/A;   CESAREAN SECTION N/A 11/11/2021   Procedure: REPEAT CESAREAN SECTION EDC: 11-25-21  ALLERG: VANCOMYCIN, MESTINON, SULFA, CIPRO, MACROBID  PREVIOUS X 1;  Surgeon: Harold Hedge, MD;  Location: [facility] LD ORS;  Service: Obstetrics;  Laterality: N/A;   TRANSTHORACIC ECHOCARDIOGRAM  07/24/2017   NORMAL   US KIDNEY BILATERAL ([facility] HX)  01/26/2017   NO MASS OR HYDRO. LEFT KIDNEY 11.5CM. NO MASS OR HYDRO    Family History  Problem Relation Age of Onset   Hypertension Father    Pancreatic cancer Father    Depression Father    Heart attack Father    Irritable bowel syndrome Mother    Hypertension Mother    Asthma Mother    Hypertension Paternal Grandfather    Arthritis Paternal Grandfather    Heart attack Paternal Grandfather    Lung cancer Maternal Grandfather    Hypertension Maternal Grandfather    Diabetes Maternal Grandmother    Crohn's disease Maternal Grandmother    Arthritis Maternal Grandmother    Depression Maternal Grandmother    Hyperlipidemia Maternal Grandmother    Hypertension Maternal Grandmother    Kidney disease Maternal Grandmother    Mental illness Maternal Grandmother    Arthritis Paternal Grandmother    Hearing loss Paternal Grandmother    Hypertension Paternal Grandmother    Crohn's disease Paternal Grandmother    Miscarriages / Stillbirths Sister    Asthma Brother    Colon cancer Neg Hx    Rectal cancer Neg Hx    Stomach cancer Neg Hx    Other Neg Hx  hypoparathyroidism    Social History   Tobacco Use   Smoking status: Never   Smokeless tobacco: Never  Vaping Use   Vaping status: Never Used  Substance Use Topics   Alcohol use: Yes    Comment: occassionaly   Drug use: No    Allergies:  Allergies  Allergen Reactions   Cisapride Other (See Comments)    Prolonged QT syndrome   Methylphenidate Hcl Other (See Comments)    Increased tachycardia   Propranolol Other (See Comments)    Lethargy and fatigue   Sertraline Hcl Diarrhea   Amphetamine Palpitations   Dextroamphetamine Palpitations   Mononessa [Norgestimate-Eth  Estradiol] Rash   Sulfa Antibiotics Rash    Other reaction(s): GI Intolerance   Vancomycin Hives and Rash    Medications Prior to Admission  Medication Sig Dispense Refill Last Dose   calcitRIOL (ROCALTROL) 0.5 MCG capsule Take 1 capsule (0.5 mcg total) by mouth in the morning and at bedtime. 180 capsule 1    Cholecalciferol 125 MCG (5000 UT) TABS Take 1 tablet (5,000 Units total) by mouth daily. 100 tablet 3    labetalol (NORMODYNE) 100 MG tablet Take 3 tablets (300 mg total) by mouth 3 (three) times daily. (Patient taking differently: Take 300 mg by mouth daily. 1 tablet once a day) 90 tablet 1    Prenatal MV & Min w/FA-DHA (PRENATAL ADULT GUMMY/DHA/FA PO) Take 2 tablets by mouth every evening.      ROS reviewed and pertinent positives and negatives as documented in HPI.  Physical Exam   Blood pressure (!) 142/98, pulse 80, temperature 98.4 F (36.9 C), resp. rate 18, height 5\' 7"  (1.702 m), weight 71.2 kg, last menstrual period 07/03/2023, unknown if currently breastfeeding.  Physical Exam Constitutional:      General: She is not in acute distress.    Appearance: Normal appearance. She is not ill-appearing.  HENT:     Head: Normocephalic and atraumatic.  Cardiovascular:     Rate and Rhythm: Normal rate.  Pulmonary:     Effort: Pulmonary effort is normal.     Breath sounds: Normal breath sounds.  Abdominal:     Palpations: Abdomen is soft.     Tenderness: There is no abdominal tenderness. There is no guarding.  Musculoskeletal:        General: Normal range of motion.  Skin:    General: Skin is warm and dry.     Findings: No rash.  Neurological:     General: No focal deficit present.     Mental Status: She is alert and oriented to person, place, and time.     MAU Course  Procedures  MDM 37 y.o. [id number] at ?[redacted]w[redacted]d who presents today w c/o vaginal bleeding in setting of positive UPT at home with negative UPT here today. She is hemodynamically stable and exam is benign.  She was advised by her OB to present to MAU to eval for SAB. Her quant today was 10, given pt no longer experiencing bleeding or abdominal pain, discussed likely low yield of U/S at this time given her low hCG. Pt known to be Rh negative, given a dose of RhoGAM today. Discussed possibility of SAB vs threatened Ab vs ectopic. Discussed ectopic precautions and return precautions in detail with patient. Will recheck quant in 48 hours. Stable for d/c at this time.  Assessment and Plan  Vaginal bleeding affecting early pregnancy - Plan: Discharge patient B-hCG 10 today Plan for repeat in 48 hours Given RhoGAM Stable  for d/c  Sundra Aland, MD OB Fellow, Faculty Practice [facility], Center for [facility]  08/03/2023, 5:21 PM

## 2023-08-03 NOTE — MAU Note (Signed)
.  Alisha Copeland is a 37 y.o. at Unknown here in MAU reporting: she had positive HPT on 10/8. Took several for about a week and they al where positive. On 10/16 she starte having some bleeding and cramping for 24 hours. Bleeding has stopped today and no pain. UPT in MAU is negative. Had called OBGYN and they told her she may need a Rhogam shot   LMP: 07/03/23 Onset of complaint: Wed Pain score: 0 Vitals:   08/03/23 1455  BP: (!) 142/98  Pulse: 80  Resp: 18  Temp: 98.4 F (36.9 C)     FHT:n/a Lab orders placed from triage:  UPT, HCG

## 2023-08-04 ENCOUNTER — Encounter: Payer: Self-pay | Admitting: Internal Medicine

## 2023-08-04 DIAGNOSIS — E209 Hypoparathyroidism, unspecified: Secondary | ICD-10-CM

## 2023-08-04 LAB — RH IG WORKUP (INCLUDES ABO/RH)
ABO/RH(D): A NEG
Antibody Screen: NEGATIVE
Gestational Age(Wks): 4
Unit division: 0

## 2023-08-05 ENCOUNTER — Inpatient Hospital Stay (HOSPITAL_COMMUNITY)
Admission: AD | Admit: 2023-08-05 | Discharge: 2023-08-05 | Disposition: A | Discharge status: Home / Self Care | Payer: Managed Care, Other (non HMO) | Attending: Obstetrics and Gynecology | Admitting: Obstetrics and Gynecology

## 2023-08-05 ENCOUNTER — Encounter (HOSPITAL_COMMUNITY): Payer: Self-pay | Admitting: *Deleted

## 2023-08-05 ENCOUNTER — Inpatient Hospital Stay (HOSPITAL_COMMUNITY): Admit: 2023-08-05 | Payer: Managed Care, Other (non HMO) | Admitting: Obstetrics & Gynecology

## 2023-08-05 ENCOUNTER — Inpatient Hospital Stay (HOSPITAL_COMMUNITY): Admission: AD | Admit: 2023-08-05 | Payer: Managed Care, Other (non HMO) | Source: Home / Self Care

## 2023-08-05 DIAGNOSIS — O209 Hemorrhage in early pregnancy, unspecified: Secondary | ICD-10-CM | POA: Diagnosis not present

## 2023-08-05 DIAGNOSIS — O039 Complete or unspecified spontaneous abortion without complication: Secondary | ICD-10-CM | POA: Insufficient documentation

## 2023-08-05 DIAGNOSIS — O99891 Other specified diseases and conditions complicating pregnancy: Secondary | ICD-10-CM | POA: Insufficient documentation

## 2023-08-05 DIAGNOSIS — Z3A01 Less than 8 weeks gestation of pregnancy: Secondary | ICD-10-CM | POA: Insufficient documentation

## 2023-08-05 DIAGNOSIS — O09521 Supervision of elderly multigravida, first trimester: Secondary | ICD-10-CM | POA: Insufficient documentation

## 2023-08-05 LAB — HCG, QUANTITATIVE, PREGNANCY: hCG, Beta Chain, Quant, S: 5 m[IU]/mL — ABNORMAL HIGH (ref <5)

## 2023-08-05 NOTE — MAU Note (Signed)
Alisha Copeland is a 37 y.o. at Unknown here in MAU reporting: here for repeat HCG level.  Was 10on 10/18.  Feeling worse today, just really tired.  Has had nausea and diarrhea. Had some bleeding on Friday night and on Saturday.  Only occurs when using the restroom.  Onset of complaint: ongoing Pain score: mild Vitals:   08/05/23 1544  BP: 123/86  Pulse: 92  Resp: 15  Temp: 98.2 F (36.8 C)  SpO2: 99%      Lab orders placed from triage:  hcg

## 2023-08-05 NOTE — MAU Provider Note (Addendum)
Chief Complaint: Follow-up    SUBJECTIVE HPI: Alisha Copeland is a 37 y.o. [id number] at at ?[redacted]w[redacted]d who presents today to maternity admissions for scheduled repeat bHCG after being seen 10/18 for suspected miscarriage. At which time her bHCG was 10.   Today she is complaining of fatigue, nausea, and diarrhea. She had some continued bleeding Friday night and Saturday, when using the bathroom.   She denies vaginal bleeding, vaginal itching/burning, urinary symptoms, h/a, dizziness, n/v, or fever/chills.     HPI  Past Medical History:  Diagnosis Date   Allergic rhinitis    Anemia    Celiac artery stenosis (HCC)    + SMA stenosis->detected during w/u for poss secondary HTN (looking for RAS). ? congenital. No sign of vasculitis or RAS.   GERD (gastroesophageal reflux disease)    History of iron deficiency anemia    +hx of iron infusion, hx of transfusion 2020 pregnancy   Hypertension    multiple w/u's for secondary HTN unrevealing/NEG   Idiopathic hypoparathyroidism (HCC)    formerly followed by peds endo at [facility]. Dr. Everardo All as adult.   Migraine syndrome    infrequent.  Remote hx of effexor for proph, +imitrex abortive med.  HAs infrequent.   Mild intermittent asthma    POTS (postural orthostatic tachycardia syndrome)    Past Surgical History:  Procedure Laterality Date   CESAREAN SECTION N/A 07/25/2019   Procedure: CESAREAN SECTION;  Surgeon: Ranae Pila, MD;  Location: [facility] LD ORS;  Service: Obstetrics;  Laterality: N/A;   CESAREAN SECTION N/A 11/11/2021   Procedure: REPEAT CESAREAN SECTION EDC: 11-25-21 ALLERG: VANCOMYCIN, MESTINON, SULFA, CIPRO, MACROBID  PREVIOUS X 1;  Surgeon: Harold Hedge, MD;  Location: [facility] LD ORS;  Service: Obstetrics;  Laterality: N/A;   TRANSTHORACIC ECHOCARDIOGRAM  07/24/2017   NORMAL   US KIDNEY BILATERAL ([facility] HX)  01/26/2017   NO MASS OR HYDRO. LEFT KIDNEY 11.5CM. NO MASS OR HYDRO   Social History   Socioeconomic History   Marital status:  Married    Spouse name: Not on file   Number of children: Not on file   Years of education: Not on file   Highest education level: Not on file  Occupational History   Not on file  Tobacco Use   Smoking status: Never   Smokeless tobacco: Never  Vaping Use   Vaping status: Never Used  Substance and Sexual Activity   Alcohol use: Yes    Comment: occassionaly   Drug use: No   Sexual activity: Yes  Other Topics Concern   Not on file  Social History Narrative   Married, 1 son as of 08/2020.   [place] native.   Educ: BA ([employer], [employer], [employer], [employer]).   Occup: pre-K teacher   No tob.   Rare ETOH.   Social Determinants of Health   Financial Resource Strain: Not on file  Food Insecurity: Not on file  Transportation Needs: Not on file  Physical Activity: Not on file  Stress: Not on file  Social Connections: Not on file  Intimate Partner Violence: Not on file   No current facility-administered medications on file prior to encounter.   Current Outpatient Medications on File Prior to Encounter  Medication Sig Dispense Refill   calcitRIOL (ROCALTROL) 0.5 MCG capsule Take 1 capsule (0.5 mcg total) by mouth in the morning and at bedtime. 180 capsule 1   Cholecalciferol 125 MCG (5000 UT) TABS Take 1 tablet (5,000 Units total) by mouth daily. 100 tablet  3   labetalol (NORMODYNE) 100 MG tablet Take 3 tablets (300 mg total) by mouth 3 (three) times daily. (Patient taking differently: Take 300 mg by mouth daily. 1 tablet once a day) 90 tablet 1   Prenatal MV & Min w/FA-DHA (PRENATAL ADULT GUMMY/DHA/FA PO) Take 2 tablets by mouth every evening.     Allergies  Allergen Reactions   Cisapride Other (See Comments)    Prolonged QT syndrome   Methylphenidate Hcl Other (See Comments)    Increased tachycardia   Propranolol Other (See Comments)    Lethargy and fatigue   Sertraline Hcl Diarrhea   Amphetamine Palpitations   Dextroamphetamine Palpitations   Mononessa [Norgestimate-Eth  Estradiol] Rash   Sulfa Antibiotics Rash    Other reaction(s): GI Intolerance   Vancomycin Hives and Rash    I have reviewed patient's Past Medical Hx, Surgical Hx, Family Hx, Social Hx, medications and allergies.   ROS:  Review of Systems Review of Systems  Other systems negative   Physical Exam  Physical Exam Patient Vitals for the past 24 hrs:  BP Temp Temp src Pulse Resp SpO2 Height Weight  08/05/23 1544 123/86 98.2 F (36.8 C) Oral 92 15 99 % 5\' 7"  (1.702 m) 70.1 kg   Constitutional: Well-developed, well-nourished female in no acute distress.  Cardiovascular: normal rate Respiratory: normal effort GI: Abd soft, non-tender. Pos BS x 4 MS: Extremities nontender, no edema, normal ROM Neurologic: Alert and oriented x 4.  GU: Neg CVAT.  LAB RESULTS Results for orders placed or performed during the hospital encounter of 08/05/23 (from the past 24 hour(s))  hCG, quantitative, pregnancy     Status: Abnormal   Collection Time: 08/05/23  3:53 PM  Result Value Ref Range   hCG, Beta Chain, Quant, S 5 (H) <5 mIU/mL    --/--/A NEG (10/18 1515)  IMAGING No results found.  MAU Management/MDM: I have reviewed the triage vital signs and the nursing notes.   Pertinent labs & imaging results that were available during my care of the patient were reviewed by me and considered in my medical decision making (see chart for details).      I have reviewed her medical records including past results, notes and treatments. Medical, Surgical, and family history were reviewed.  Medications and recent lab tests were reviewed  Ordered repeat bHCG.  Treatments in MAU included serum HCG.   This bleeding/pain can represent a normal pregnancy with bleeding, spontaneous abortion or even an ectopic which can be life-threatening.  The process as listed above helps to determine which of these is present.  ASSESSMENT 1. Vaginal bleeding affecting early pregnancy   2. Miscarriage   S/p RhoGAM  10/18, known A negative  37 y.o. [id number] at ?[redacted]w[redacted]d who presents today for follow bHCG. She is hemodynamically stable and exam is benign. Her quant today is 5 from 10, given pt no longer experiencing bleeding or abdominal pain, discussed likely SAB.   PLAN Discharge home  Pt stable at time of discharge. Encouraged to return here if she develops worsening of symptoms, increase in pain, fever, or other concerning symptoms.   Wyn Forster, MD FMOB Fellow, Faculty practice [facility], Center for [facility]  08/05/2023  4:47 PM

## 2023-08-06 ENCOUNTER — Other Ambulatory Visit: Payer: Self-pay

## 2023-08-06 ENCOUNTER — Other Ambulatory Visit: Payer: Managed Care, Other (non HMO)

## 2023-08-06 ENCOUNTER — Inpatient Hospital Stay (HOSPITAL_COMMUNITY): Payer: Managed Care, Other (non HMO)

## 2023-08-06 DIAGNOSIS — E209 Hypoparathyroidism, unspecified: Secondary | ICD-10-CM

## 2023-08-06 MED ORDER — CALCITRIOL 0.5 MCG PO CAPS: 0.5000 ug | ORAL_CAPSULE | Freq: Two times a day (BID) | ORAL | 1 refills | Status: DC

## 2023-10-04 ENCOUNTER — Ambulatory Visit: Payer: Managed Care, Other (non HMO) | Admitting: Internal Medicine

## 2023-10-19 ENCOUNTER — Encounter: Payer: Self-pay | Admitting: Internal Medicine

## 2023-10-19 ENCOUNTER — Ambulatory Visit (INDEPENDENT_AMBULATORY_CARE_PROVIDER_SITE_OTHER): Payer: Managed Care, Other (non HMO) | Admitting: Internal Medicine

## 2023-10-19 VITALS — BP 110/60 | HR 85 | Ht 67.0 in | Wt 158.8 lb

## 2023-10-19 DIAGNOSIS — E209 Hypoparathyroidism, unspecified: Secondary | ICD-10-CM

## 2023-10-19 DIAGNOSIS — N2889 Other specified disorders of kidney and ureter: Secondary | ICD-10-CM | POA: Diagnosis not present

## 2023-10-19 DIAGNOSIS — E559 Vitamin D deficiency, unspecified: Secondary | ICD-10-CM

## 2023-10-19 NOTE — Progress Notes (Addendum)
 Patient ID: Alisha Copeland, female   DOB: 01-Sep-1986, 38 y.o.   MRN: [id number]  HPI  Alisha Copeland is a 38 y.o.-year-old female,  returning for follow-up for idiopathic hypoparathyroidism and vitamin D  deficiency.  She previously saw Dr. Kassie, but last visit with me 6 months ago.  Interim history: Since last visit, she contacted me with a pregnancy, but unfortunately, she had a miscarriage 07/2023.  She is still contemplating a pregnancy, but not right away.  She continues to have occasional mild cramps in her feet.  No perioral numbness.  Reviewed and addended history: Pt was dx with hypocalcemia at age 34.  Genetic testing was reportedly negative.  She was admitted to the hospital and was in ICU for acute renal failure at age 69 due to overtreatment.  However, no further ED visits or hospitalizations since.  She declined Natpara when it was available - per review of Dr. Laymond notes.  She has 2 children.  She is contemplating another pregnancy.  She had to reduce the doses of her supplements during the previous pregnancies.  I reviewed pt's pertinent labs: Lab Results  Component Value Date   CAION 4.7 05/07/2023   CAION 4.6 (L) 04/03/2023   CAION 4.5 (L) 11/08/2022   CAION 4.3 (L) 10/02/2022   CAION 0.89 (L) 12/16/2015   CAION 1.01 (L) 07/22/2008   CAION 0.97 (L) 07/15/2008   Lab Results  Component Value Date   PTH 8 (L) 05/31/2020   PTH 1 (L) 09/30/2019   PTH 1 (L) 12/13/2018   PTH 1 (L) 11/06/2018   PTH 19 01/14/2018   PTH 3 (L) 01/10/2017   PTH 2 (L) 06/06/2016   PTH 3 (L) 02/04/2016   PTH 2 (L) 10/29/2015   PTH 2 (L) 04/28/2015   CALCIUM  9.0 12/28/2022   CALCIUM  9.6 02/03/2022   CALCIUM  8.2 (L) 11/11/2021   CALCIUM  7.5 (L) 09/30/2021   CALCIUM  9.1 08/29/2021   CALCIUM  8.9 07/07/2021   CALCIUM  8.9 06/17/2021   CALCIUM  8.3 (L) 01/04/2021   CALCIUM  8.1 (A) 09/01/2020   CALCIUM  8.4 (L) 05/31/2020   No history of osteoporosis or fracture.  No hand  cramping or perioral numbness.  She does have foot cramps occasionally.  No h/o kidney stones.  She has a h/o vitamin D  deficiency.  Reviewed vit D levels: Lab Results  Component Value Date   VD25OH 38.65 04/03/2023   VD25OH 27.64 (L) 10/02/2022   VD25OH 25.50 (L) 02/03/2022   VD25OH 34.55 09/30/2021   VD25OH 23.07 (L) 06/17/2021   VD25OH 23.81 (L) 01/04/2021   VD25OH 24.62 (L) 05/31/2020   VD25OH 20.37 (L) 09/30/2019   VD25OH 28.01 (L) 06/03/2019   VD25OH 29.29 (L) 03/18/2019   Pt is on: - vitamin D  5000 units daily  - calcitriol  0.5 mg 4x >> 3x a day (decreased 01/2022) >> 2x a day (decreased 03/2023) for the 24-hour urine calcium  results: Component     Latest Ref Rng 04/17/2023 04/23/2023 05/07/2023  Calcium , 24H Urine     mg/24 h 426 (H)  393 (H)  272 (H)   Creatinine, 24H Ur     0.50 - 2.15 g/24 h 1.38  1.37     Other labs reviewed: Component     Latest Ref Rng 10/02/2022 11/08/2022  Vitamin D  1, 25 (OH) Total     18 - 72 pg/mL 19    Vitamin D3 1, 25 (OH)     pg/mL 19    Vitamin D2 1,  25 (OH)     pg/mL <8    Calcium  Ionized     4.7 - 5.5 mg/dL 4.3 (L)  4.5 (L)   VITD     30.00 - 100.00 ng/mL 27.64 (L)    Phosphorus     2.3 - 4.6 mg/dL 4.6    Magnesium     1.5 - 2.5 mg/dL 1.6      Renal ultrasound (01/25/2017) was normal Renal ultrasound (11/15/2022) showed a 1.9 cm hypoechoic mass in the mid right kidney Renal MRI (11/19/2022) showed that the mass is actually a benign-appearing cyst She is followed by nephrology, but not urology.  No h/o CKD. Last BUN/Cr: Lab Results  Component Value Date   BUN 17 12/28/2022   CREATININE 0.78 02/03/2022   Pt does not have a FH of hypocalcemia.  I reviewed her chart and she also has a history of IDA, GERD, migraines, celiac disease, POTS - seen at [facility].  ROS: + see HPI  Past Medical History:  Diagnosis Date   Allergic rhinitis    Anemia    Celiac artery stenosis (HCC)    + SMA stenosis->detected during w/u for poss  secondary HTN (looking for RAS). ? congenital. No sign of vasculitis or RAS.   GERD (gastroesophageal reflux disease)    History of iron deficiency anemia    +hx of iron infusion, hx of transfusion 2020 pregnancy   Hypertension    multiple w/u's for secondary HTN unrevealing/NEG   Idiopathic hypoparathyroidism (HCC)    formerly followed by peds endo at [facility]. Dr. Kassie as adult.   Migraine syndrome    infrequent.  Remote hx of effexor for proph, +imitrex abortive med.  HAs infrequent.   Mild intermittent asthma    POTS (postural orthostatic tachycardia syndrome)    Past Surgical History:  Procedure Laterality Date   CESAREAN SECTION N/A 07/25/2019   Procedure: CESAREAN SECTION;  Surgeon: Marne Kelly Nest, MD;  Location: [facility] LD ORS;  Service: Obstetrics;  Laterality: N/A;   CESAREAN SECTION N/A 11/11/2021   Procedure: REPEAT CESAREAN SECTION EDC: 11-25-21 ALLERG: VANCOMYCIN, MESTINON, SULFA, CIPRO, MACROBID  PREVIOUS X 1;  Surgeon: Curlene Agent, MD;  Location: [facility] LD ORS;  Service: Obstetrics;  Laterality: N/A;   TRANSTHORACIC ECHOCARDIOGRAM  07/24/2017   NORMAL   US  KIDNEY BILATERAL ([facility] HX)  01/26/2017   NO MASS OR HYDRO. LEFT KIDNEY 11.5CM. NO MASS OR HYDRO   Social History   Socioeconomic History   Marital status: Married    Spouse name: Not on file   Number of children: Not on file   Years of education: Not on file   Highest education level: Not on file  Occupational History   Not on file  Tobacco Use   Smoking status: Never   Smokeless tobacco: Never  Vaping Use   Vaping status: Never Used  Substance and Sexual Activity   Alcohol use: Yes    Comment: occassionaly   Drug use: No   Sexual activity: Yes  Other Topics Concern   Not on file  Social History Narrative   Married, 1 son as of 08/2020.   [place] native.   Educ: BA ([employer], [employer], [employer], [employer]).   Occup: pre-K teacher   No tob.   Rare ETOH.   Social Drivers of Research Scientist (physical Sciences) Strain: Not on file  Food Insecurity: Not on file  Transportation Needs: Not on file  Physical Activity: Not on file  Stress: Not on file  Social Connections: Not on file  Intimate Partner Violence: Not on file   Current Outpatient Medications on File Prior to Visit  Medication Sig Dispense Refill   calcitRIOL  (ROCALTROL ) 0.5 MCG capsule Take 1 capsule (0.5 mcg total) by mouth in the morning and at bedtime. 180 capsule 1   Cholecalciferol  125 MCG (5000 UT) TABS Take 1 tablet (5,000 Units total) by mouth daily. 100 tablet 3   labetalol  (NORMODYNE ) 100 MG tablet Take 3 tablets (300 mg total) by mouth 3 (three) times daily. (Patient taking differently: Take 300 mg by mouth daily. 1 tablet once a day) 90 tablet 1   Prenatal MV & Min w/FA-DHA (PRENATAL ADULT GUMMY/DHA/FA PO) Take 2 tablets by mouth every evening.     No current facility-administered medications on file prior to visit.   Allergies  Allergen Reactions   Cisapride Other (See Comments)    Prolonged QT syndrome   Methylphenidate Hcl Other (See Comments)    Increased tachycardia   Propranolol Other (See Comments)    Lethargy and fatigue   Sertraline Hcl Diarrhea   Amphetamine Palpitations   Dextroamphetamine Palpitations   Mononessa [Norgestimate-Eth Estradiol] Rash   Sulfa Antibiotics Rash    Other reaction(s): GI Intolerance   Vancomycin Hives and Rash   Family History  Problem Relation Age of Onset   Hypertension Father    Pancreatic cancer Father    Depression Father    Heart attack Father    Irritable bowel syndrome Mother    Hypertension Mother    Asthma Mother    Hypertension Paternal Grandfather    Arthritis Paternal Grandfather    Heart attack Paternal Grandfather    Lung cancer Maternal Grandfather    Hypertension Maternal Grandfather    Diabetes Maternal Grandmother    Crohn's disease Maternal Grandmother    Arthritis Maternal Grandmother    Depression Maternal Grandmother    Hyperlipidemia  Maternal Grandmother    Hypertension Maternal Grandmother    Kidney disease Maternal Grandmother    Mental illness Maternal Grandmother    Arthritis Paternal Grandmother    Hearing loss Paternal Grandmother    Hypertension Paternal Grandmother    Crohn's disease Paternal Grandmother    Miscarriages / Stillbirths Sister    Asthma Brother    Colon cancer Neg Hx    Rectal cancer Neg Hx    Stomach cancer Neg Hx    Other Neg Hx        hypoparathyroidism   PE: BP 110/60   Pulse 85   Ht 5' 7 (1.702 m)   Wt 158 lb 12.8 oz (72 kg)   LMP 07/03/2023 Comment: pt thought preg  SpO2 97%   BMI 24.87 kg/m  Wt Readings from Last 3 Encounters:  10/19/23 158 lb 12.8 oz (72 kg)  08/05/23 154 lb 8 oz (70.1 kg)  08/03/23 157 lb (71.2 kg)   Constitutional: Normal weight, in NAD Eyes:  EOMI, no exophthalmos ENT: no neck masses, no cervical lymphadenopathy Cardiovascular: RRR, No MRG Respiratory: CTA B Musculoskeletal: no deformities Skin:no rashes Neurological: no tremor with outstretched hands  Assessment: 1.  Idiopathic hypoparathyroidism  2.  Vitamin D  deficiency  3.  Renal cyst -Per her nephrologist, this could be due to hypertension.  PLAN: 1. Patient with long history of hypercalcemia and the diagnosis of idiopathic hypoparathyroidism diagnosed in childhood.  She was admitted at 38 years old for complications of overtreatment of her hypocalcemia, with acute kidney injury but she had no similar event since then. -  I first saw the patient a year ago and at that time she did not have apparent signs or symptoms of hypocalcemia.  She did not have perioral numbness or acral cramping.  We discussed at that time that the etiology of her hyperparathyroidism was not clear.  She did not have an abnormal vitamin D  level.  Her PTH was low, but detectable.  It is possible that she either had autoimmune destruction of her PTH or may have a defect in the PTH gene. -Since our last visit, we have been  decreasing the dose of her calcitriol , currently on 0.5 mcg twice a day, decreased from 4 times a day as her urinary calcium  levels were high.  After the change in calcitriol , this started to decrease.  Latest 24-hour urine calcium  was improved, at 272 mg per 24 hours: Component     Latest Ref Rng 04/17/2023 04/23/2023 05/07/2023  Calcium , 24H Urine     mg/24 h 426 (H)  393 (H)  272 (H)   Creatinine, 24H Ur     0.50 - 2.15 g/24 h 1.38  1.37    -She continues on vitamin D  5000 units daily. -Her ionized calcium  improved in the last year.  At last check, this was low normal.  We discussed that our goal would be to keep it either in the low normal range or slightly below. -Per guidelines, at our first visit, I also recommended a renal ultrasound to check for renal calcinosis.  Of note, the renal ultrasound from 2019 was normal.  However, on 11/15/2022, she had a 1.9 cm hypoechoic mass in the mid right kidney.  We checked an MRI on 11/19/2022 and this actually looks like a benign cyst.  We will check another ultrasound next month. -At today's visit we will check another 24-hour urine for calcium  then we will also check the ionized calcium , phosphorus, vitamin D , and calcitriol  level. -Since last visit, she contacted me with a positive pregnancy test.  During previous pregnancies she needed lower doses of calcitriol  and calcium  and we were prepared to do this (especially in the second half of the pregnancy) however, unfortunately, she had a miscarriage recently - I will see her back in 1 year, but may need to check labs sooner if she gets pregnant again.  2.  Vitamin D  deficiency -Currently on 5000 units vitamin D  daily -At last visit, vitamin D  level was normal -Will recheck this today  3.  Renal cyst -We will repeat an ultrasound now -If unchanged, may need to repeat the ultrasound in 3 to 4 years afterwards.  Orders Placed This Encounter  Procedures   US  Renal   VITAMIN D  25 Hydroxy (Vit-D Deficiency,  Fractures)   Calcium , ionized   Phosphorus   Vitamin D  1,25 dihydroxy   Creatinine, urine, 24 hour   Calcium , urine, 24 hour   Component     Latest Ref Rng 10/19/2023  Phosphorus     2.5 - 4.5 mg/dL 5.7 (H)   Calcium  Ionized     4.7 - 5.5 mg/dL 4.7   Vitamin D  1, 25 (OH) Total     18 - 72 pg/mL 36   Vitamin D3 1, 25 (OH)     pg/mL 36   Vitamin D2 1, 25 (OH)     pg/mL <8   Vitamin D , 25-Hydroxy     30 - 100 ng/mL 52     Elevated phosphorus.  Ionized calcium  is normal, as is the calcitriol  and vitamin D   level. Due to the high phosphorus, my recommendation would be to decrease the dose of her calcitriol  to 0.25 mcg twice a day but to add calcium  carbonate 500 mg with the largest meal of her day. I will then have her back for a repeat of her ionized calcium  and phosphorus in 1 month.  The 24-hour urine calcium  is pending.  The plan may change after these results return.  Lela Fendt, MD PhD [facility] Endocrinology

## 2023-10-19 NOTE — Patient Instructions (Addendum)
 Please stop at the lab.  Please continue: - Calcitriol 0.5 mcg 2x a day - vitamin D 5000 units daily  Let's schedule another renal ultrasound in 11/2023.  You should have an endocrinology follow-up appointment in 1 year.

## 2023-10-24 LAB — PHOSPHORUS: Phosphorus: 5.7 mg/dL — ABNORMAL HIGH (ref 2.5–4.5)

## 2023-10-24 LAB — CALCIUM, IONIZED: Calcium, Ion: 4.7 mg/dL (ref 4.7–5.5)

## 2023-10-24 LAB — VITAMIN D 1,25 DIHYDROXY
Vitamin D 1, 25 (OH)2 Total: 36 pg/mL (ref 18–72)
Vitamin D2 1, 25 (OH)2: <8 pg/mL
Vitamin D3 1, 25 (OH)2: 36 pg/mL

## 2023-10-24 LAB — VITAMIN D 25 HYDROXY (VIT D DEFICIENCY, FRACTURES): Vit D, 25-Hydroxy: 52 ng/mL (ref 30–100)

## 2023-10-24 MED ORDER — CALCITRIOL 0.25 MCG PO CAPS: ORAL_CAPSULE | ORAL | 3 refills | Status: DC

## 2023-10-24 NOTE — Addendum Note (Signed)
 Addended by: Carlus Pavlov on: 10/24/2023 12:12 PM   Modules accepted: Orders

## 2023-10-24 NOTE — Addendum Note (Signed)
 Addended by: Carlus Pavlov on: 10/24/2023 12:15 PM   Modules accepted: Orders

## 2023-11-26 ENCOUNTER — Ambulatory Visit
Admission: RE | Admit: 2023-11-26 | Discharge: 2023-11-26 | Disposition: A | Discharge status: Home / Self Care | Payer: Managed Care, Other (non HMO) | Source: Ambulatory Visit | Attending: Internal Medicine | Admitting: Internal Medicine

## 2023-11-26 DIAGNOSIS — N2889 Other specified disorders of kidney and ureter: Secondary | ICD-10-CM

## 2023-11-27 ENCOUNTER — Encounter: Payer: Self-pay | Admitting: Internal Medicine

## 2023-11-30 NOTE — Patient Instructions (Signed)

## 2023-12-03 ENCOUNTER — Other Ambulatory Visit: Payer: Managed Care, Other (non HMO)

## 2023-12-03 ENCOUNTER — Encounter: Payer: Self-pay | Admitting: Family Medicine

## 2023-12-03 ENCOUNTER — Ambulatory Visit (INDEPENDENT_AMBULATORY_CARE_PROVIDER_SITE_OTHER): Payer: Managed Care, Other (non HMO) | Admitting: Family Medicine

## 2023-12-03 VITALS — BP 111/75 | HR 81 | Ht 67.5 in | Wt 157.8 lb

## 2023-12-03 DIAGNOSIS — I10 Essential (primary) hypertension: Secondary | ICD-10-CM

## 2023-12-03 DIAGNOSIS — Z8759 Personal history of other complications of pregnancy, childbirth and the puerperium: Secondary | ICD-10-CM

## 2023-12-03 DIAGNOSIS — Z0000 Encounter for general adult medical examination without abnormal findings: Secondary | ICD-10-CM

## 2023-12-03 DIAGNOSIS — E200 Idiopathic hypoparathyroidism: Secondary | ICD-10-CM

## 2023-12-03 LAB — COMPREHENSIVE METABOLIC PANEL
ALT: 15 U/L (ref 0–35)
AST: 15 U/L (ref 0–37)
Albumin: 4.5 g/dL (ref 3.5–5.2)
Alkaline Phosphatase: 51 U/L (ref 39–117)
BUN: 15 mg/dL (ref 6–23)
CO2: 27 meq/L (ref 19–32)
Calcium: 8.4 mg/dL (ref 8.4–10.5)
Chloride: 102 meq/L (ref 96–112)
Creatinine, Ser: 0.69 mg/dL (ref 0.40–1.20)
GFR: 110.79 mL/min (ref >60.00)
Glucose, Bld: 112 mg/dL — ABNORMAL HIGH (ref 70–99)
Potassium: 4 meq/L (ref 3.5–5.1)
Sodium: 139 meq/L (ref 135–145)
Total Bilirubin: 0.4 mg/dL (ref 0.2–1.2)
Total Protein: 7.2 g/dL (ref 6.0–8.3)

## 2023-12-03 LAB — LIPID PANEL
Cholesterol: 192 mg/dL (ref 0–200)
HDL: 48.3 mg/dL (ref >39.00)
LDL Cholesterol: 114 mg/dL — ABNORMAL HIGH (ref 0–99)
NonHDL: 143.74
Total CHOL/HDL Ratio: 4
Triglycerides: 150 mg/dL — ABNORMAL HIGH (ref 0.0–149.0)
VLDL: 30 mg/dL (ref 0.0–40.0)

## 2023-12-03 LAB — CBC WITH DIFFERENTIAL/PLATELET
Basophils Absolute: 0 10*3/uL (ref 0.0–0.1)
Basophils Relative: 0.4 % (ref 0.0–3.0)
Eosinophils Absolute: 0.1 10*3/uL (ref 0.0–0.7)
Eosinophils Relative: 1.8 % (ref 0.0–5.0)
HCT: 38.3 % (ref 36.0–46.0)
Hemoglobin: 13.1 g/dL (ref 12.0–15.0)
Lymphocytes Relative: 34.2 % (ref 12.0–46.0)
Lymphs Abs: 2.4 10*3/uL (ref 0.7–4.0)
MCHC: 34.2 g/dL (ref 30.0–36.0)
MCV: 86 fL (ref 78.0–100.0)
Monocytes Absolute: 0.5 10*3/uL (ref 0.1–1.0)
Monocytes Relative: 7 % (ref 3.0–12.0)
Neutro Abs: 3.9 10*3/uL (ref 1.4–7.7)
Neutrophils Relative %: 56.6 % (ref 43.0–77.0)
Platelets: 268 10*3/uL (ref 150.0–400.0)
RBC: 4.46 Mil/uL (ref 3.87–5.11)
RDW: 13.5 % (ref 11.5–15.5)
WBC: 6.9 10*3/uL (ref 4.0–10.5)

## 2023-12-03 LAB — CALCIUM, URINE, 24 HOUR: Calcium, 24H Urine: 210 mg/(24.h)

## 2023-12-03 LAB — VITAMIN D 25 HYDROXY (VIT D DEFICIENCY, FRACTURES): VITD: 54.13 ng/mL (ref 30.00–100.00)

## 2023-12-03 LAB — CREATININE, URINE, 24 HOUR: Creatinine, 24H Ur: 1.44 g/(24.h) (ref 0.50–2.15)

## 2023-12-03 LAB — TSH: TSH: 1.09 u[IU]/mL (ref 0.35–5.50)

## 2023-12-03 NOTE — Progress Notes (Signed)
 Office Note 12/03/2023  CC:  Chief Complaint  Patient presents with   Annual Exam    Pt is not fasting    HPI:  Alisha Copeland is a 38 y.o. female who is here to reestablish care, CPE.  I last saw her for her on 09/07/2020. Patient's most recent primary MD: None Old records in epic/health Link EMR reviewed prior to or during today's visit.  Feeling well, no acute concerns. Home blood pressures consistently normal.  She continues to take labetalol but just 100 mg in the evening.  Spontaneous abortion 07/2023, recovering well.   Has two healthy children ages 4 yr and 2 yr. 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   Past Medical History:  Diagnosis Date   Allergic rhinitis    Anemia    Celiac artery stenosis (HCC)    + SMA stenosis->detected during w/u for poss secondary HTN (looking for RAS). ? congenital. No sign of vasculitis or RAS.   GERD (gastroesophageal reflux disease)    History of iron deficiency anemia    +hx of iron infusion, hx of transfusion 2020 pregnancy   Hypertension    multiple w/u's for secondary HTN unrevealing/NEG   Idiopathic hypoparathyroidism (HCC)    formerly followed by peds endo at [facility]. Dr. Everardo All as adult.   Migraine syndrome    infrequent.  Remote hx of effexor for proph, +imitrex abortive med.  HAs infrequent.   Mild intermittent asthma    POTS (postural orthostatic tachycardia syndrome)     Past Surgical History:  Procedure Laterality Date   CESAREAN SECTION N/A 07/25/2019   Procedure: CESAREAN SECTION;  Surgeon: Ranae Pila, MD;  Location: [facility] LD ORS;  Service: Obstetrics;  Laterality: N/A;   CESAREAN SECTION N/A 11/11/2021   Procedure: REPEAT CESAREAN SECTION EDC: 11-25-21 ALLERG: VANCOMYCIN, MESTINON, SULFA, CIPRO, MACROBID  PREVIOUS X 1;  Surgeon: Harold Hedge, MD;  Location: [facility] LD ORS;  Service: Obstetrics;  Laterality: N/A;   TRANSTHORACIC ECHOCARDIOGRAM  07/24/2017   NORMAL   US KIDNEY BILATERAL  ([facility] HX)  01/26/2017   NO MASS OR HYDRO. LEFT KIDNEY 11.5CM. NO MASS OR HYDRO    Family History  Problem Relation Age of Onset   Hypertension Father    Pancreatic cancer Father    Depression Father    Heart attack Father    Irritable bowel syndrome Mother    Hypertension Mother    Asthma Mother    Hypertension Paternal Grandfather    Arthritis Paternal Grandfather    Heart attack Paternal Grandfather    Lung cancer Maternal Grandfather    Hypertension Maternal Grandfather    Diabetes Maternal Grandmother    Crohn's disease Maternal Grandmother    Arthritis Maternal Grandmother    Depression Maternal Grandmother    Hyperlipidemia Maternal Grandmother    Hypertension Maternal Grandmother    Kidney disease Maternal Grandmother    Mental illness Maternal Grandmother    Arthritis Paternal Grandmother    Hearing loss Paternal Grandmother    Hypertension Paternal Grandmother    Crohn's disease Paternal Grandmother    Miscarriages / Stillbirths Sister    Asthma Brother    Colon cancer Neg Hx    Rectal cancer Neg Hx    Stomach cancer Neg Hx    Other Neg Hx        hypoparathyroidism    Social History   Socioeconomic History   Marital status: Married    Spouse name: Not on file   Number of children:  Not on file   Years of education: Not on file   Highest education level: Bachelor's degree (e.g., BA, AB, BS)  Occupational History   Not on file  Tobacco Use   Smoking status: Never   Smokeless tobacco: Never  Vaping Use   Vaping status: Never Used  Substance and Sexual Activity   Alcohol use: Yes    Comment: occassionaly   Drug use: No   Sexual activity: Yes  Other Topics Concern   Not on file  Social History Narrative   Married, 1 son as of 08/2020.   [place] native.   Educ: BA ([employer], [employer], [employer], [employer]).   Occup: pre-K teacher   No tob.   Rare ETOH.   Social Drivers of Health   Financial Resource Strain: Patient Declined (12/01/2023)   Overall  Financial Resource Strain (CARDIA)    Difficulty of Paying Living Expenses: Patient declined  Food Insecurity: No Food Insecurity (12/01/2023)   Hunger Vital Sign    Worried About Running Out of Food in the Last Year: Never true    Ran Out of Food in the Last Year: Never true  Transportation Needs: No Transportation Needs (12/01/2023)   PRAPARE - Administrator, Civil Service (Medical): No    Lack of Transportation (Non-Medical): No  Physical Activity: Insufficiently Active (12/01/2023)   Exercise Vital Sign    Days of Exercise per Week: 4 days    Minutes of Exercise per Session: 30 min  Stress: No Stress Concern Present (12/01/2023)   [employer] of Occupational Health - Occupational Stress Questionnaire    Feeling of Stress : Not at all  Social Connections: Socially Integrated (12/01/2023)   Social Connection and Isolation Panel [NHANES]    Frequency of Communication with Friends and Family: More than three times a week    Frequency of Social Gatherings with Friends and Family: Three times a week    Attends Religious Services: More than 4 times per year    Active Member of Clubs or Organizations: Yes    Attends Banker Meetings: More than 4 times per year    Marital Status: Married  Catering manager Violence: Not on file    Outpatient Encounter Medications as of 12/03/2023  Medication Sig   calcitRIOL (ROCALTROL) 0.25 MCG capsule Take 1 tablet by mouth twice daily   Cholecalciferol 125 MCG (5000 UT) TABS Take 1 tablet (5,000 Units total) by mouth daily.   labetalol (NORMODYNE) 100 MG tablet Take 3 tablets (300 mg total) by mouth 3 (three) times daily. (Patient taking differently: Take 100 mg by mouth daily. 1 tablet once a day)   Prenatal MV & Min w/FA-DHA (PRENATAL ADULT GUMMY/DHA/FA PO) Take 2 tablets by mouth every evening.   No facility-administered encounter medications on file as of 12/03/2023.    Allergies  Allergen Reactions   Cisapride Other  (See Comments)    Prolonged QT syndrome   Methylphenidate Hcl Other (See Comments)    Increased tachycardia   Propranolol Other (See Comments)    Lethargy and fatigue   Sertraline Hcl Diarrhea   Amphetamine Palpitations   Dextroamphetamine Palpitations   Mononessa [Norgestimate-Eth Estradiol] Rash   Sulfa Antibiotics Rash    Other reaction(s): GI Intolerance   Vancomycin Hives and Rash    Review of Systems  Constitutional:  Negative for appetite change, chills, fatigue and fever.  HENT:  Negative for congestion, dental problem, ear pain and sore throat.   Eyes:  Negative for  discharge, redness and visual disturbance.  Respiratory:  Negative for cough, chest tightness, shortness of breath and wheezing.   Cardiovascular:  Negative for chest pain, palpitations and leg swelling.  Gastrointestinal:  Negative for abdominal pain, blood in stool, diarrhea, nausea and vomiting.  Genitourinary:  Negative for difficulty urinating, dysuria, flank pain, frequency, hematuria and urgency.  Musculoskeletal:  Negative for arthralgias, back pain, joint swelling, myalgias and neck stiffness.  Skin:  Negative for pallor and rash.  Neurological:  Negative for dizziness, speech difficulty, weakness and headaches.  Hematological:  Negative for adenopathy. Does not bruise/bleed easily.  Psychiatric/Behavioral:  Negative for confusion and sleep disturbance. The patient is not nervous/anxious.    PE; Blood pressure 111/75, pulse 81, height 5' 7.5" (1.715 m), weight 157 lb 12.8 oz (71.6 kg), last menstrual period 11/17/2022, SpO2 99%, not currently breastfeeding.  Physical Exam Body mass index is 24.35 kg/m. Exam chaperoned by Harlene Salts, CMA  Gen: Alert, well appearing.  Patient is oriented to person, place, time, and situation. AFFECT: pleasant, lucid thought and speech. ENT: Ears: EACs clear, normal epithelium.  TMs with good light reflex and landmarks bilaterally.  Eyes: no injection, icteris,  swelling, or exudate.  EOMI, PERRLA. Nose: no drainage or turbinate edema/swelling.  No injection or focal lesion.  Mouth: lips without lesion/swelling.  Oral mucosa pink and moist.  Dentition intact and without obvious caries or gingival swelling.  Oropharynx without erythema, exudate, or swelling.  Neck: supple/nontender.  No LAD, mass, or TM.  Carotid pulses 2+ bilaterally, without bruits. CV: RRR, no m/r/g.   LUNGS: CTA bilat, nonlabored resps, good aeration in all lung fields. ABD: soft, NT, ND, BS normal.  No hepatospenomegaly or mass.  No bruits. EXT: no clubbing, cyanosis, or edema.  Musculoskeletal: no joint swelling, erythema, warmth, or tenderness.  ROM of all joints intact. Skin - no sores or suspicious lesions or rashes or color changes  Pertinent labs:  Last CBC Lab Results  Component Value Date   WBC 14.6 (H) 11/12/2021   HGB 10.3 (L) 11/12/2021   HCT 31.5 (L) 11/12/2021   MCV 88.7 11/12/2021   MCH 29.0 11/12/2021   RDW 14.3 11/12/2021   PLT 189 11/12/2021   Lab Results  Component Value Date   IRON 61 01/04/2021   TIBC 296 09/01/2020   FERRITIN 99 03/11/2020   Last metabolic panel Lab Results  Component Value Date   GLUCOSE 109 (H) 02/03/2022   NA 139 12/28/2022   K 3.6 12/28/2022   CL 98 (A) 12/28/2022   CO2 30 (A) 12/28/2022   BUN 17 12/28/2022   CREATININE 0.78 02/03/2022   EGFR 104 12/28/2022   CALCIUM 9.0 12/28/2022   PHOS 5.7 (H) 10/19/2023   PROT 6.2 (L) 11/11/2021   ALBUMIN 4.7 12/28/2022   BILITOT 0.4 11/11/2021   ALKPHOS 141 (H) 11/11/2021   AST 22 11/11/2021   ALT 18 11/11/2021   ANIONGAP 9 11/11/2021   Last lipids No results found for: "CHOL", "HDL", "LDLCALC", "LDLDIRECT", "TRIG", "CHOLHDL" Last hemoglobin A1c Lab Results  Component Value Date   HGBA1C 4.9 03/18/2019   Last thyroid functions Lab Results  Component Value Date   TSH 0.67 09/30/2021   Last vitamin D Lab Results  Component Value Date   VD25OH 52 10/19/2023    Last vitamin B12 and Folate Lab Results  Component Value Date   VITAMINB12 392 01/04/2021   ASSESSMENT AND PLAN:   #1 health maintenance exam: Reviewed age and gender appropriate health maintenance  issues (prudent diet, regular exercise, health risks of tobacco and excessive alcohol, use of seatbelts, fire alarms in home, use of sunscreen).  Also reviewed age and gender appropriate health screening as well as vaccine recommendations. Vaccines: Prevnar 20-->declined.  Flu-->declined. Labs: HP and vitamin D ordered. Cervical ca screening: per GYN MD Breast ca screening: Start annual screening mammograms age 54.  #2 hypertension, well-controlled. She will continue labetalol 100 mg nightly.  This medication is refilled by her nephrologist, whom she sees every 6 months.  #3 idiopathic hypoparathyroidism, followed by Dr. Elvera Lennox. She had labs done this morning to check ionized calcium and phosphorus levels.  An After Visit Summary was printed and given to the patient.  Return in about 1 year (around 12/02/2024) for annual CPE (fasting).  Signed:  Santiago Bumpers, MD           12/03/2023

## 2023-12-04 ENCOUNTER — Telehealth: Payer: Self-pay

## 2023-12-04 ENCOUNTER — Encounter: Payer: Self-pay | Admitting: Internal Medicine

## 2023-12-04 ENCOUNTER — Ambulatory Visit (INDEPENDENT_AMBULATORY_CARE_PROVIDER_SITE_OTHER): Payer: Managed Care, Other (non HMO)

## 2023-12-04 DIAGNOSIS — R7301 Impaired fasting glucose: Secondary | ICD-10-CM | POA: Diagnosis not present

## 2023-12-04 LAB — CALCIUM, IONIZED: Calcium, Ion: 4.6 mg/dL — ABNORMAL LOW (ref 4.7–5.5)

## 2023-12-04 LAB — PHOSPHORUS: Phosphorus: 5 mg/dL — ABNORMAL HIGH (ref 2.5–4.5)

## 2023-12-04 LAB — HEMOGLOBIN A1C: Hgb A1c MFr Bld: 5.4 % (ref 4.6–6.5)

## 2023-12-04 NOTE — Telephone Encounter (Signed)
-----   Message from Jeoffrey Massed sent at 12/04/2023  8:05 AM EST ----- Can you add a HbA1c to these labs?  Dx is Impaired fasting glucose.-thx

## 2023-12-06 ENCOUNTER — Encounter: Payer: Self-pay | Admitting: Family Medicine

## 2024-01-16 LAB — COMPREHENSIVE METABOLIC PANEL WITH GFR
Albumin: 4.6 (ref 3.5–5.0)
Calcium: 8.7 (ref 8.7–10.7)
eGFR: 110

## 2024-01-16 LAB — CBC AND DIFFERENTIAL
HCT: 36 (ref 36–46)
Hemoglobin: 12.6 (ref 12.0–16.0)
Neutrophils Absolute: 4.1
Platelets: 272 10*3/uL (ref 150–400)
WBC: 7

## 2024-01-16 LAB — BASIC METABOLIC PANEL WITH GFR
BUN: 18 (ref 4–21)
CO2: 26 — AB (ref 13–22)
Chloride: 100 (ref 99–108)
Creatinine: 0.7 (ref 0.5–1.1)
Glucose: 93
Potassium: 4.3 meq/L (ref 3.5–5.1)
Sodium: 139 (ref 137–147)

## 2024-01-16 LAB — PROTEIN / CREATININE RATIO, URINE
Albumin, U: 4.6
Creatinine, Urine: 102.4

## 2024-01-16 LAB — HEPATIC FUNCTION PANEL
ALT: 16 U/L (ref 7–35)
AST: 18 (ref 13–35)
Alkaline Phosphatase: 66 (ref 25–125)

## 2024-01-16 LAB — IRON,TIBC AND FERRITIN PANEL
%SAT: 19
Ferritin: 46
Iron: 64
TIBC: 342
UIBC: 278

## 2024-01-16 LAB — MICROALBUMIN / CREATININE URINE RATIO
Albumin, Urine POC: 4.6
EGFR: 110
Microalb Creat Ratio: 4

## 2024-08-04 ENCOUNTER — Other Ambulatory Visit: Payer: Self-pay

## 2024-08-04 DIAGNOSIS — E209 Hypoparathyroidism, unspecified: Secondary | ICD-10-CM

## 2024-08-04 MED ORDER — CALCITRIOL 0.25 MCG PO CAPS: ORAL_CAPSULE | ORAL | 3 refills | Status: DC

## 2024-10-10 ENCOUNTER — Other Ambulatory Visit: Payer: Self-pay

## 2024-10-10 DIAGNOSIS — E209 Hypoparathyroidism, unspecified: Secondary | ICD-10-CM

## 2024-10-10 MED ORDER — CALCITRIOL 0.25 MCG PO CAPS: ORAL_CAPSULE | ORAL | 0 refills | Status: AC

## 2024-10-17 ENCOUNTER — Encounter: Payer: Self-pay | Admitting: Internal Medicine

## 2024-10-17 ENCOUNTER — Ambulatory Visit (INDEPENDENT_AMBULATORY_CARE_PROVIDER_SITE_OTHER): Payer: Managed Care, Other (non HMO) | Admitting: Internal Medicine

## 2024-10-17 VITALS — BP 120/80 | HR 83 | Ht 67.5 in | Wt 163.4 lb

## 2024-10-17 DIAGNOSIS — E209 Hypoparathyroidism, unspecified: Secondary | ICD-10-CM | POA: Diagnosis not present

## 2024-10-17 DIAGNOSIS — E559 Vitamin D deficiency, unspecified: Secondary | ICD-10-CM

## 2024-10-17 DIAGNOSIS — N2889 Other specified disorders of kidney and ureter: Secondary | ICD-10-CM | POA: Diagnosis not present

## 2024-10-17 NOTE — Progress Notes (Addendum)
 Patient ID: Alisha Copeland, female   DOB: [birth year], 39 y.o.   MRN: [id number]  HPI  Alisha Copeland is a 39 y.o.-year-old female,  returning for follow-up for idiopathic hypoparathyroidism and vitamin D  deficiency.  She previously saw Dr. Kassie, but last visit with me 1 year ago.  Interim history: Before last visit, she contacted me with a pregnancy, but she had a miscarriage 07/2023.  Unfortunately, she had another miscarriage in 01/2024. She and her husband decided to try 1 more time for pregnancy.  They have 2 children. No muscle cramps or perioral numbness.  Reviewed history: Pt was dx with hypocalcemia at age 39.  Genetic testing was reportedly negative.  She was admitted to the hospital and was in ICU for acute renal failure at age 39 due to overtreatment.  However, no further ED visits or hospitalizations since.  She declined Natpara when it was available - per review of Dr. Laymond notes.  She has 2 children.  She is contemplating another pregnancy.  She had to reduce the doses of her supplements during the previous pregnancies.   I reviewed pt's pertinent labs: Lab Results  Component Value Date   CAION 4.6 (L) 12/03/2023   CAION 4.7 10/19/2023   CAION 4.7 05/07/2023   CAION 4.6 (L) 04/03/2023   CAION 4.5 (L) 11/08/2022   CAION 4.3 (L) 10/02/2022   CAION 0.89 (L) 12/16/2015   CAION 1.01 (L) 07/22/2008   CAION 0.97 (L) 07/15/2008   Lab Results  Component Value Date   PTH 8 (L) 05/31/2020   PTH 1 (L) 09/30/2019   PTH 1 (L) 12/13/2018   PTH 1 (L) 11/06/2018   PTH 19 01/14/2018   PTH 3 (L) 01/10/2017   PTH 2 (L) 06/06/2016   PTH 3 (L) 02/04/2016   PTH 2 (L) 10/29/2015   PTH 2 (L) 04/28/2015   CALCIUM  8.7 01/16/2024   CALCIUM  8.4 12/03/2023   CALCIUM  9.0 12/28/2022   CALCIUM  9.6 02/03/2022   CALCIUM  8.2 (L) 11/11/2021   CALCIUM  7.5 (L) 09/30/2021   CALCIUM  9.1 08/29/2021   CALCIUM  8.9 07/07/2021   CALCIUM  8.9 06/17/2021   CALCIUM  8.3 (L) 01/04/2021    No history of osteoporosis or fracture.  No hand cramping or perioral numbness.  She does have foot cramps occasionally.  No h/o kidney stones.  She has a h/o vitamin D  deficiency.  Reviewed vit D levels: Lab Results  Component Value Date   VD25OH 54.13 12/03/2023   VD25OH 52 10/19/2023   VD25OH 38.65 04/03/2023   VD25OH 27.64 (L) 10/02/2022   VD25OH 25.50 (L) 02/03/2022   VD25OH 34.55 09/30/2021   VD25OH 23.07 (L) 06/17/2021   VD25OH 23.81 (L) 01/04/2021   VD25OH 24.62 (L) 05/31/2020   VD25OH 20.37 (L) 09/30/2019   Pt is on: - vitamin D  5000 units daily  - Calcium  500 mg daily with dinner -recommended in 10/2023 - she started 1000 mg 1x a day or every other day - calcitriol  0.5 mg 4x >> 3x a day (decreased 01/2022) >> 2x a day (decreased 03/2023) >> 0.25 mcg 2x a day (10/2023): Component     Latest Ref Rng 12/03/2023  Creatinine, 24H Ur     0.50 - 2.15 g/24 h 1.44   Calcium , 24H Urine     mg/24 h 210    Component     Latest Ref Rng 04/17/2023 04/23/2023 05/07/2023  Calcium , 24H Urine     mg/24 h 426 (H)  393 (H)  272 (  H)   Creatinine, 24H Ur     0.50 - 2.15 g/24 h 1.38  1.37     Other labs reviewed: Component     Latest Ref Rng 10/19/2023 12/03/2023  Vitamin D  1, 25 (OH) Total     18 - 72 pg/mL 36    Vitamin D3 1, 25 (OH)     pg/mL 36    Vitamin D2 1, 25 (OH)     pg/mL <8    Phosphorus     2.5 - 4.5 mg/dL 5.7 (H)  5.0 (H)     Component     Latest Ref Rng 10/02/2022  Vitamin D  1, 25 (OH) Total     18 - 72 pg/mL 19   Vitamin D3 1, 25 (OH)     pg/mL 19   Vitamin D2 1, 25 (OH)     pg/mL <8   Phosphorus     2.3 - 4.6 mg/dL 4.6   Magnesium     1.5 - 2.5 mg/dL 1.6     Renal ultrasound (01/25/2017) was normal. Renal ultrasound (11/15/2022) showed a 1.9 cm hypoechoic mass in the mid right kidney Renal MRI (11/19/2022) showed that the mass is actually a benign-appearing cyst Renal U/S (11/27/2023): Right renal stable, complex cyst measuring 2 cm, essentially  unchanged She is followed by nephrology, but not urology.  No h/o CKD. Last BUN/Cr: Lab Results  Component Value Date   BUN 18 01/16/2024   CREATININE 0.7 01/16/2024   Pt does not have a FH of hypocalcemia.  I reviewed her chart and she also has a history of IDA, GERD, migraines, celiac disease, POTS - seen at [facility].  ROS: + see HPI  Past Medical History:  Diagnosis Date   Allergic rhinitis    Anemia    Celiac artery stenosis    + SMA stenosis->detected during w/u for poss secondary HTN (looking for RAS). ? congenital. No sign of vasculitis or RAS.   GERD (gastroesophageal reflux disease)    History of iron deficiency anemia    +hx of iron infusion, hx of transfusion 2020 pregnancy   Hypertension    multiple w/u's for secondary HTN unrevealing/NEG   Idiopathic hypoparathyroidism    formerly followed by peds endo at [facility]. Dr. Kassie as adult.   IFG (impaired fasting glucose)    Migraine syndrome    infrequent.  Remote hx of effexor for proph, +imitrex abortive med.  HAs infrequent.   Mild intermittent asthma    POTS (postural orthostatic tachycardia syndrome)    Past Surgical History:  Procedure Laterality Date   CESAREAN SECTION N/A 07/25/2019   Procedure: CESAREAN SECTION;  Surgeon: Marne Kelly Nest, MD;  Location: [facility] LD ORS;  Service: Obstetrics;  Laterality: N/A;   CESAREAN SECTION N/A 11/11/2021   Procedure: REPEAT CESAREAN SECTION EDC: 11-25-21 ALLERG: VANCOMYCIN, MESTINON, SULFA, CIPRO, MACROBID  PREVIOUS X 1;  Surgeon: Curlene Agent, MD;  Location: [facility] LD ORS;  Service: Obstetrics;  Laterality: N/A;   TRANSTHORACIC ECHOCARDIOGRAM  07/24/2017   NORMAL   US  KIDNEY BILATERAL ([facility] HX)  01/26/2017   NO MASS OR HYDRO. LEFT KIDNEY 11.5CM. NO MASS OR HYDRO   Social History   Socioeconomic History   Marital status: Married    Spouse name: Not on file   Number of children: Not on file   Years of education: Not on file   Highest education level: Bachelor's degree  (e.g., BA, AB, BS)  Occupational History   Not on file  Tobacco  Use   Smoking status: Never   Smokeless tobacco: Never  Vaping Use   Vaping status: Never Used  Substance and Sexual Activity   Alcohol use: Yes    Comment: occassionaly   Drug use: No   Sexual activity: Yes  Other Topics Concern   Not on file  Social History Narrative   Married, 1 son as of 08/2020.   [place] native.   Educ: BA ([employer], [employer], [employer], [employer]).   Occup: pre-K teacher   No tob.   Rare ETOH.   Social Drivers of Health   Tobacco Use: Low Risk (12/03/2023)   Patient History    Smoking Tobacco Use: Never    Smokeless Tobacco Use: Never    Passive Exposure: Not on file  Financial Resource Strain: Patient Declined (12/01/2023)   Overall Financial Resource Strain (CARDIA)    Difficulty of Paying Living Expenses: Patient declined  Food Insecurity: No Food Insecurity (12/01/2023)   Hunger Vital Sign    Worried About Running Out of Food in the Last Year: Never true    Ran Out of Food in the Last Year: Never true  Transportation Needs: No Transportation Needs (12/01/2023)   PRAPARE - Administrator, Civil Service (Medical): No    Lack of Transportation (Non-Medical): No  Physical Activity: Insufficiently Active (12/01/2023)   Exercise Vital Sign    Days of Exercise per Week: 4 days    Minutes of Exercise per Session: 30 min  Stress: No Stress Concern Present (12/01/2023)   [employer] of Occupational Health - Occupational Stress Questionnaire    Feeling of Stress : Not at all  Social Connections: Socially Integrated (12/01/2023)   Social Connection and Isolation Panel    Frequency of Communication with Friends and Family: More than three times a week    Frequency of Social Gatherings with Friends and Family: Three times a week    Attends Religious Services: More than 4 times per year    Active Member of Clubs or Organizations: Yes    Attends Banker Meetings: More  than 4 times per year    Marital Status: Married  Catering Manager Violence: Not on file  Depression (PHQ2-9): Low Risk (12/03/2023)   Depression (PHQ2-9)    PHQ-2 Score: 0  Alcohol Screen: Low Risk (12/01/2023)   Alcohol Screen    Last Alcohol Screening Score (AUDIT): 2  Housing: Unknown (12/01/2023)   Housing Stability Vital Sign    Unable to Pay for Housing in the Last Year: No    Number of Times Moved in the Last Year: Not on file    Homeless in the Last Year: No  Utilities: Not on file  Health Literacy: Not on file   Current Outpatient Medications on File Prior to Visit  Medication Sig Dispense Refill   calcitRIOL  (ROCALTROL ) 0.25 MCG capsule Take 1 tablet by mouth twice daily 180 capsule 0   Cholecalciferol  125 MCG (5000 UT) TABS Take 1 tablet (5,000 Units total) by mouth daily. 100 tablet 3   labetalol  (NORMODYNE ) 100 MG tablet Take 3 tablets (300 mg total) by mouth 3 (three) times daily. (Patient taking differently: Take 100 mg by mouth daily. 1 tablet once a day) 90 tablet 1   Prenatal MV & Min w/FA-DHA (PRENATAL ADULT GUMMY/DHA/FA PO) Take 2 tablets by mouth every evening.     No current facility-administered medications on file prior to visit.   Allergies  Allergen Reactions   Cisapride Other (See Comments)  Prolonged QT syndrome   Methylphenidate Hcl Other (See Comments)    Increased tachycardia   Propranolol Other (See Comments)    Lethargy and fatigue   Sertraline Hcl Diarrhea   Amphetamine Palpitations   Dextroamphetamine Palpitations   Mononessa [Norgestimate-Eth Estradiol] Rash   Sulfa Antibiotics Rash    Other reaction(s): GI Intolerance   Vancomycin Hives and Rash   Family History  Problem Relation Age of Onset   Hypertension Father    Pancreatic cancer Father    Depression Father    Heart attack Father    Irritable bowel syndrome Mother    Hypertension Mother    Asthma Mother    Hypertension Paternal Grandfather    Arthritis Paternal Grandfather     Heart attack Paternal Grandfather    Lung cancer Maternal Grandfather    Hypertension Maternal Grandfather    Diabetes Maternal Grandmother    Crohn's disease Maternal Grandmother    Arthritis Maternal Grandmother    Depression Maternal Grandmother    Hyperlipidemia Maternal Grandmother    Hypertension Maternal Grandmother    Kidney disease Maternal Grandmother    Mental illness Maternal Grandmother    Arthritis Paternal Grandmother    Hearing loss Paternal Grandmother    Hypertension Paternal Grandmother    Crohn's disease Paternal Grandmother    Miscarriages / Stillbirths Sister    Asthma Brother    Colon cancer Neg Hx    Rectal cancer Neg Hx    Stomach cancer Neg Hx    Other Neg Hx        hypoparathyroidism   PE: BP 120/80   Pulse 83   Ht 5' 7.5 (1.715 m)   Wt 163 lb 6.4 oz (74.1 kg)   SpO2 97%   BMI 25.21 kg/m  Wt Readings from Last 3 Encounters:  10/17/24 163 lb 6.4 oz (74.1 kg)  12/03/23 157 lb 12.8 oz (71.6 kg)  10/19/23 158 lb 12.8 oz (72 kg)   Constitutional: Normal weight, in NAD Eyes:  EOMI, no exophthalmos ENT: no neck masses, no cervical lymphadenopathy Cardiovascular: RRR, No MRG Respiratory: CTA B Musculoskeletal: no deformities Skin:no rashes Neurological: no tremor with outstretched hands  Assessment: 1.  Idiopathic hypoparathyroidism  2.  Vitamin D  deficiency  3.  Renal cyst -Per her nephrologist, this could be due to hypertension.  PLAN: 1. Patient with long history of hypocalcemia and the diagnosis of idiopathic hypoparathyroidism diagnosed in childhood.  She was admitted at 39 years old for complications of overtreatment of her hypocalcemia, with acute kidney injury but no similar events since. - I first saw the patient 2 years ago and at that time she did not have apparent signs or symptoms of hypocalcemia.  She continues to remain symptom-free, without perioral numbness or ache or cramping.  We discussed that her hypocalcemia etiology  is not clear.  She did not have an abnormal vitamin D  level, and her PTH level was low, but detectable.  It is possible that she either had autoimmune destruction of her parathyroid cells or she may have a defect in the PTH gene. - After I started to see the patient, we have been decreasing the dose of calcitriol  due to her elevated renal calcium  levels.she was initially taking calcitriol  0.5 mcg 4 times a day, then decreased to 0.5 mcg twice a day, and the last change being to 0.25 mcg twice a day.  The last change was due to her phosphorus level being higher, at 5.7.  After the last change in  dose, her phosphorus decreased to 5.0, only slightly above target, and her 24-hour urine calcium  finally decreased to normal: Component     Latest Ref Rng 12/03/2023  Creatinine, 24H Ur     0.50 - 2.15 g/24 h 1.44   Calcium , 24H Urine     mg/24 h 210   Previously: Component     Latest Ref Rng 04/17/2023 04/23/2023 05/07/2023  Calcium , 24H Urine     mg/24 h 426 (H)  393 (H)  272 (H)   Creatinine, 24H Ur     0.50 - 2.15 g/24 h 1.38  1.37    - Her ionized calcium  remains low normal, and we discussed that this is our target.  We would not want her calcium  levels higher to avoid nephrocalcinosis.  At last visit, I did advise her to start calcium  with dinner 5000 mcg daily.  She mentions that she started 1000 mg and she takes this every day and every other day.  I will let her know if she needed to decrease the dose after today's labs.  Return. -Per guidelines, at our first visit, I also recommended a renal ultrasound to check for renal calcinosis.  Of note, the renal ultrasound from 2019 was normal, however, in 10/2022, she had a 1.9 cm hypoechoic mass in the mid right kidney.  We checked an MRI in 11/2022 and this actually looks like a benign cyst.  We repeated a renal ultrasound (11/26/2022) which showed stable, 2 cm, complex right renal cyst (please also see problem #3) -At today's visit we discussed about repeating a  24-hour urine for calcium  and we will again check her ionized calcium , phosphorus, vitamin D  and calcitriol  level - For last visit, she contacted me with a positive pregnancy test.  During previous pregnancies she needed lower doses of calcitriol  and calcium  and we were prepared to do this (especially in the second half of the pregnancy) however, fortunately, she had a miscarriage.  She had another miscarriage 01/2024.  She and her husband would like to try 1 more time to get pregnant. - I will see her back in 1 year, but we need to check labs sooner if she gets pregnant again  2.  Vitamin D  deficiency - Continues on 5000 units vitamin D  daily - At last visit, vitamin D  level was normal - We will recheck this today  3.  Renal cyst - We now have 2 renal ultrasounds 1 year apart showing stability of the cyst - There were no concerning features and no imaging follow-up recommended on her first renal ultrasound, but we decided to repeat her ultrasound in another 1 to 2 years to ensure stability.  Needs refills Calcitriol  to [facility].  Orders Placed This Encounter  Procedures   VITAMIN D  25 Hydroxy (Vit-D Deficiency, Fractures)   Vitamin D  1,25 dihydroxy   Phosphorus   Calcium , ionized   Calcium , urine, 24 hour   Creatinine, urine, 24 hour   Component     Latest Ref Rng 10/17/2024  Phosphorus     2.5 - 4.5 mg/dL 5.5 (H)   Calcium  Ionized     4.7 - 5.5 mg/dL 4.7   Vitamin D  1, 25 (OH) Total     18 - 72 pg/mL 28   Vitamin D3 1, 25 (OH)     pg/mL 28   Vitamin D2 1, 25 (OH)     pg/mL <8   Vitamin D , 25-Hydroxy     30 - 100 ng/mL 66  Her phosphorus is higher, I will need to check with the patient if she just had a meal prior to labs.  If not, we will try to decrease her calcitriol  to 0.25 mcg once a day.   Ionized calcium  is at the lower limit of the target range, at goal.   Calcitriol  level is normal.   Vitamin D  level is slightly higher than our target and trending up.  Since  this can increase her phosphorus, I will advise her to reduce the dose of vitamin D  to 4000 units daily.  24-hour urine calcium  is pending.  Lela Fendt, MD PhD [facility] End[facility]

## 2024-10-17 NOTE — Patient Instructions (Addendum)
 Please stop at the lab.  Please continue: - Calcitriol  0.25 mcg 2x a day - vitamin D  5000 units daily - calcium  1000 mg with dinner  Please collect another 24-hour urine.  You should have an endocrinology follow-up appointment in 1 year.

## 2024-10-20 LAB — VITAMIN D 1,25 DIHYDROXY
Vitamin D 1, 25 (OH)2 Total: 28 pg/mL (ref 18–72)
Vitamin D2 1, 25 (OH)2: <8 pg/mL
Vitamin D3 1, 25 (OH)2: 28 pg/mL

## 2024-10-20 LAB — CALCIUM, IONIZED: Calcium, Ion: 4.7 mg/dL (ref 4.7–5.5)

## 2024-10-20 LAB — PHOSPHORUS: Phosphorus: 5.5 mg/dL — ABNORMAL HIGH (ref 2.5–4.5)

## 2024-10-20 LAB — VITAMIN D 25 HYDROXY (VIT D DEFICIENCY, FRACTURES): Vit D, 25-Hydroxy: 66 ng/mL (ref 30–100)

## 2024-10-21 ENCOUNTER — Ambulatory Visit: Payer: Self-pay | Admitting: Internal Medicine

## 2024-10-21 DIAGNOSIS — E209 Hypoparathyroidism, unspecified: Secondary | ICD-10-CM

## 2024-12-01 ENCOUNTER — Other Ambulatory Visit

## 2024-12-03 ENCOUNTER — Encounter: Payer: Managed Care, Other (non HMO) | Admitting: Family Medicine

## 2025-10-26 ENCOUNTER — Ambulatory Visit: Admitting: Internal Medicine
